# Patient Record
Sex: Female | Born: 1935 | Race: White | Hispanic: No | Smoking: Former smoker
Health system: Southern US, Community
[De-identification: ages and names within clinical notes are randomized; demographics above are authoritative.]

## PROBLEM LIST (undated history)

## (undated) DIAGNOSIS — E785 Hyperlipidemia, unspecified: Secondary | ICD-10-CM

## (undated) DIAGNOSIS — I10 Essential (primary) hypertension: Secondary | ICD-10-CM

## (undated) DIAGNOSIS — K219 Gastro-esophageal reflux disease without esophagitis: Secondary | ICD-10-CM

## (undated) DIAGNOSIS — M1990 Unspecified osteoarthritis, unspecified site: Secondary | ICD-10-CM

## (undated) DIAGNOSIS — F419 Anxiety disorder, unspecified: Secondary | ICD-10-CM

## (undated) DIAGNOSIS — M8580 Other specified disorders of bone density and structure, unspecified site: Secondary | ICD-10-CM

## (undated) HISTORY — PX: CHOLECYSTECTOMY: SHX55

## (undated) HISTORY — PX: ABDOMINAL HYSTERECTOMY: SHX81

## (undated) HISTORY — DX: Hyperlipidemia, unspecified: E78.5

## (undated) HISTORY — PX: JOINT REPLACEMENT: SHX530

## (undated) HISTORY — DX: Other specified disorders of bone density and structure, unspecified site: M85.80

## (undated) HISTORY — PX: TONSILLECTOMY: SUR1361

## (undated) HISTORY — DX: Anxiety disorder, unspecified: F41.9

---

## 2000-01-04 ENCOUNTER — Encounter: Admission: RE | Admit: 2000-01-04 | Discharge: 2000-01-04 | Payer: Self-pay | Admitting: Internal Medicine

## 2000-01-04 ENCOUNTER — Encounter: Payer: Self-pay | Admitting: Internal Medicine

## 2000-01-16 ENCOUNTER — Encounter: Payer: Self-pay | Admitting: Internal Medicine

## 2000-01-16 ENCOUNTER — Encounter: Admission: RE | Admit: 2000-01-16 | Discharge: 2000-01-16 | Payer: Self-pay | Admitting: Internal Medicine

## 2000-01-17 ENCOUNTER — Encounter: Payer: Self-pay | Admitting: Internal Medicine

## 2000-01-17 ENCOUNTER — Encounter: Admission: RE | Admit: 2000-01-17 | Discharge: 2000-01-17 | Payer: Self-pay | Admitting: Internal Medicine

## 2001-02-16 ENCOUNTER — Encounter: Payer: Self-pay | Admitting: Internal Medicine

## 2001-02-16 ENCOUNTER — Encounter: Admission: RE | Admit: 2001-02-16 | Discharge: 2001-02-16 | Payer: Self-pay | Admitting: Internal Medicine

## 2002-02-17 ENCOUNTER — Encounter: Admission: RE | Admit: 2002-02-17 | Discharge: 2002-02-17 | Payer: Self-pay | Admitting: Internal Medicine

## 2002-02-17 ENCOUNTER — Encounter: Payer: Self-pay | Admitting: Internal Medicine

## 2002-05-20 ENCOUNTER — Emergency Department (HOSPITAL_COMMUNITY): Admission: EM | Admit: 2002-05-20 | Discharge: 2002-05-20 | Payer: Self-pay | Admitting: Emergency Medicine

## 2002-05-22 ENCOUNTER — Emergency Department (HOSPITAL_COMMUNITY): Admission: EM | Admit: 2002-05-22 | Discharge: 2002-05-22 | Payer: Self-pay | Admitting: Emergency Medicine

## 2002-06-09 ENCOUNTER — Ambulatory Visit (HOSPITAL_COMMUNITY): Admission: RE | Admit: 2002-06-09 | Discharge: 2002-06-09 | Payer: Self-pay | Admitting: Internal Medicine

## 2002-07-02 ENCOUNTER — Inpatient Hospital Stay (HOSPITAL_COMMUNITY): Admission: AD | Admit: 2002-07-02 | Discharge: 2002-07-03 | Payer: Self-pay | Admitting: Family Medicine

## 2002-07-02 ENCOUNTER — Encounter: Payer: Self-pay | Admitting: Family Medicine

## 2003-02-21 ENCOUNTER — Encounter: Admission: RE | Admit: 2003-02-21 | Discharge: 2003-02-21 | Payer: Self-pay | Admitting: Internal Medicine

## 2003-02-21 ENCOUNTER — Encounter: Payer: Self-pay | Admitting: Internal Medicine

## 2004-01-25 ENCOUNTER — Ambulatory Visit (HOSPITAL_COMMUNITY): Admission: RE | Admit: 2004-01-25 | Discharge: 2004-01-25 | Payer: Self-pay | Admitting: Gastroenterology

## 2004-02-22 ENCOUNTER — Encounter: Admission: RE | Admit: 2004-02-22 | Discharge: 2004-02-22 | Payer: Self-pay | Admitting: Internal Medicine

## 2004-05-09 ENCOUNTER — Encounter: Admission: RE | Admit: 2004-05-09 | Discharge: 2004-05-09 | Payer: Self-pay | Admitting: Internal Medicine

## 2004-05-28 ENCOUNTER — Ambulatory Visit: Payer: Self-pay | Admitting: Internal Medicine

## 2004-07-06 ENCOUNTER — Ambulatory Visit: Payer: Self-pay | Admitting: Internal Medicine

## 2004-08-02 ENCOUNTER — Ambulatory Visit: Payer: Self-pay | Admitting: Internal Medicine

## 2004-09-25 ENCOUNTER — Ambulatory Visit: Payer: Self-pay | Admitting: Internal Medicine

## 2005-01-02 ENCOUNTER — Ambulatory Visit: Payer: Self-pay | Admitting: Family Medicine

## 2005-01-28 ENCOUNTER — Ambulatory Visit: Payer: Self-pay | Admitting: Internal Medicine

## 2005-03-14 ENCOUNTER — Ambulatory Visit: Payer: Self-pay | Admitting: Internal Medicine

## 2005-03-16 ENCOUNTER — Ambulatory Visit: Payer: Self-pay | Admitting: Family Medicine

## 2005-03-27 ENCOUNTER — Encounter: Admission: RE | Admit: 2005-03-27 | Discharge: 2005-03-27 | Payer: Self-pay | Admitting: Internal Medicine

## 2005-05-14 ENCOUNTER — Ambulatory Visit: Payer: Self-pay | Admitting: Internal Medicine

## 2005-07-31 ENCOUNTER — Ambulatory Visit: Payer: Self-pay | Admitting: Internal Medicine

## 2005-10-28 ENCOUNTER — Ambulatory Visit: Payer: Self-pay | Admitting: Internal Medicine

## 2005-11-18 ENCOUNTER — Ambulatory Visit: Payer: Self-pay | Admitting: Internal Medicine

## 2006-02-19 ENCOUNTER — Ambulatory Visit: Payer: Self-pay | Admitting: Internal Medicine

## 2006-04-03 ENCOUNTER — Encounter: Admission: RE | Admit: 2006-04-03 | Discharge: 2006-04-03 | Payer: Self-pay | Admitting: Internal Medicine

## 2006-05-14 ENCOUNTER — Ambulatory Visit: Payer: Self-pay | Admitting: Internal Medicine

## 2006-05-19 ENCOUNTER — Ambulatory Visit: Payer: Self-pay | Admitting: Internal Medicine

## 2006-07-31 ENCOUNTER — Ambulatory Visit: Payer: Self-pay | Admitting: Internal Medicine

## 2006-11-03 ENCOUNTER — Ambulatory Visit: Payer: Self-pay | Admitting: Internal Medicine

## 2006-11-28 ENCOUNTER — Ambulatory Visit: Payer: Self-pay | Admitting: Internal Medicine

## 2006-12-04 ENCOUNTER — Ambulatory Visit: Payer: Self-pay | Admitting: Internal Medicine

## 2007-01-06 ENCOUNTER — Encounter: Payer: Self-pay | Admitting: Internal Medicine

## 2007-01-06 ENCOUNTER — Ambulatory Visit: Payer: Self-pay | Admitting: Internal Medicine

## 2007-01-06 DIAGNOSIS — I10 Essential (primary) hypertension: Secondary | ICD-10-CM | POA: Insufficient documentation

## 2007-01-06 DIAGNOSIS — E785 Hyperlipidemia, unspecified: Secondary | ICD-10-CM

## 2007-01-06 DIAGNOSIS — M545 Low back pain: Secondary | ICD-10-CM

## 2007-02-10 ENCOUNTER — Ambulatory Visit: Payer: Self-pay | Admitting: Internal Medicine

## 2007-03-16 ENCOUNTER — Ambulatory Visit: Payer: Self-pay | Admitting: Internal Medicine

## 2007-03-16 DIAGNOSIS — R109 Unspecified abdominal pain: Secondary | ICD-10-CM | POA: Insufficient documentation

## 2007-03-16 LAB — CONVERTED CEMR LAB
ALT: 13 units/L (ref 0–35)
Amylase: 51 units/L (ref 27–131)
BUN: 11 mg/dL (ref 6–23)
Basophils Relative: 0.2 % (ref 0.0–1.0)
Bilirubin, Direct: 0.1 mg/dL (ref 0.0–0.3)
CO2: 30 meq/L (ref 19–32)
Chloride: 104 meq/L (ref 96–112)
Eosinophils Relative: 1.9 % (ref 0.0–5.0)
GFR calc Af Amer: 69 mL/min
HCT: 40.9 % (ref 36.0–46.0)
Hemoglobin: 13.8 g/dL (ref 12.0–15.0)
Lymphocytes Relative: 15.8 % (ref 12.0–46.0)
Monocytes Absolute: 0.7 10*3/uL (ref 0.2–0.7)
Neutro Abs: 5.4 10*3/uL (ref 1.4–7.7)
Platelets: 253 10*3/uL (ref 150–400)
RDW: 13.5 % (ref 11.5–14.6)
Total Bilirubin: 0.8 mg/dL (ref 0.3–1.2)
Total Protein: 7.1 g/dL (ref 6.0–8.3)
WBC: 7.4 10*3/uL (ref 4.5–10.5)

## 2007-03-17 ENCOUNTER — Encounter: Admission: RE | Admit: 2007-03-17 | Discharge: 2007-03-17 | Payer: Self-pay | Admitting: Internal Medicine

## 2007-03-19 ENCOUNTER — Telehealth (INDEPENDENT_AMBULATORY_CARE_PROVIDER_SITE_OTHER): Payer: Self-pay | Admitting: *Deleted

## 2007-04-23 ENCOUNTER — Encounter: Admission: RE | Admit: 2007-04-23 | Discharge: 2007-04-23 | Payer: Self-pay | Admitting: Internal Medicine

## 2007-05-13 ENCOUNTER — Ambulatory Visit: Payer: Self-pay | Admitting: Internal Medicine

## 2007-05-29 ENCOUNTER — Telehealth: Payer: Self-pay | Admitting: Internal Medicine

## 2007-09-14 ENCOUNTER — Ambulatory Visit: Payer: Self-pay | Admitting: Internal Medicine

## 2007-10-19 ENCOUNTER — Telehealth (INDEPENDENT_AMBULATORY_CARE_PROVIDER_SITE_OTHER): Payer: Self-pay | Admitting: *Deleted

## 2007-10-19 ENCOUNTER — Telehealth: Payer: Self-pay | Admitting: Internal Medicine

## 2007-10-19 ENCOUNTER — Encounter: Payer: Self-pay | Admitting: Internal Medicine

## 2007-10-20 ENCOUNTER — Ambulatory Visit: Payer: Self-pay | Admitting: Internal Medicine

## 2007-10-20 DIAGNOSIS — R0609 Other forms of dyspnea: Secondary | ICD-10-CM | POA: Insufficient documentation

## 2007-10-20 DIAGNOSIS — R0989 Other specified symptoms and signs involving the circulatory and respiratory systems: Secondary | ICD-10-CM

## 2008-02-08 ENCOUNTER — Ambulatory Visit: Payer: Self-pay | Admitting: Internal Medicine

## 2008-02-17 ENCOUNTER — Telehealth: Payer: Self-pay | Admitting: Family Medicine

## 2008-02-18 ENCOUNTER — Ambulatory Visit: Payer: Self-pay | Admitting: Family Medicine

## 2008-02-23 ENCOUNTER — Ambulatory Visit: Payer: Self-pay | Admitting: Internal Medicine

## 2008-02-23 DIAGNOSIS — M546 Pain in thoracic spine: Secondary | ICD-10-CM | POA: Insufficient documentation

## 2008-03-30 ENCOUNTER — Ambulatory Visit: Payer: Self-pay | Admitting: Internal Medicine

## 2008-03-30 DIAGNOSIS — M79609 Pain in unspecified limb: Secondary | ICD-10-CM

## 2008-03-31 ENCOUNTER — Emergency Department (HOSPITAL_COMMUNITY): Admission: EM | Admit: 2008-03-31 | Discharge: 2008-03-31 | Payer: Self-pay | Admitting: Emergency Medicine

## 2008-04-01 ENCOUNTER — Telehealth: Payer: Self-pay | Admitting: Family Medicine

## 2008-04-27 ENCOUNTER — Telehealth: Payer: Self-pay | Admitting: Internal Medicine

## 2008-04-28 ENCOUNTER — Encounter: Payer: Self-pay | Admitting: Internal Medicine

## 2008-04-28 ENCOUNTER — Encounter: Admission: RE | Admit: 2008-04-28 | Discharge: 2008-04-28 | Payer: Self-pay | Admitting: Internal Medicine

## 2008-06-09 ENCOUNTER — Ambulatory Visit: Payer: Self-pay | Admitting: Internal Medicine

## 2009-01-13 ENCOUNTER — Encounter: Payer: Self-pay | Admitting: Internal Medicine

## 2009-01-16 ENCOUNTER — Ambulatory Visit: Payer: Self-pay | Admitting: Internal Medicine

## 2009-01-16 DIAGNOSIS — M1990 Unspecified osteoarthritis, unspecified site: Secondary | ICD-10-CM

## 2009-01-16 LAB — CONVERTED CEMR LAB
AST: 25 units/L (ref 0–37)
Alkaline Phosphatase: 121 units/L — ABNORMAL HIGH (ref 39–117)
Basophils Absolute: 0 10*3/uL (ref 0.0–0.1)
Calcium: 9.2 mg/dL (ref 8.4–10.5)
Cholesterol: 147 mg/dL (ref 0–200)
Eosinophils Absolute: 0 10*3/uL (ref 0.0–0.7)
HCT: 42.2 % (ref 36.0–46.0)
HDL: 68.3 mg/dL (ref >39.00)
MCHC: 33.1 g/dL (ref 30.0–36.0)
Monocytes Relative: 10.4 % (ref 3.0–12.0)
Neutro Abs: 3.4 10*3/uL (ref 1.4–7.7)
Platelets: 197 10*3/uL (ref 150.0–400.0)
Potassium: 4.3 meq/L (ref 3.5–5.1)
RDW: 14 % (ref 11.5–14.6)
Sodium: 146 meq/L — ABNORMAL HIGH (ref 135–145)
Total Protein: 6.9 g/dL (ref 6.0–8.3)
Triglycerides: 119 mg/dL (ref 0.0–149.0)

## 2009-04-05 ENCOUNTER — Emergency Department (HOSPITAL_COMMUNITY): Admission: EM | Admit: 2009-04-05 | Discharge: 2009-04-05 | Payer: Self-pay | Admitting: Emergency Medicine

## 2009-04-07 ENCOUNTER — Encounter: Admission: RE | Admit: 2009-04-07 | Discharge: 2009-04-07 | Payer: Self-pay | Admitting: Specialist

## 2009-05-09 ENCOUNTER — Ambulatory Visit: Payer: Self-pay | Admitting: Internal Medicine

## 2009-05-21 IMAGING — CR DG ABDOMEN ACUTE W/ 1V CHEST
3 series · 3 of 3 positions shown · non-contrast
Comparison: None.

CLINICAL DATA: Abdominal pain.
 ABDOMINAL SERIES, INCLUDING SINGLE VIEW CHEST:

[w chest pa]
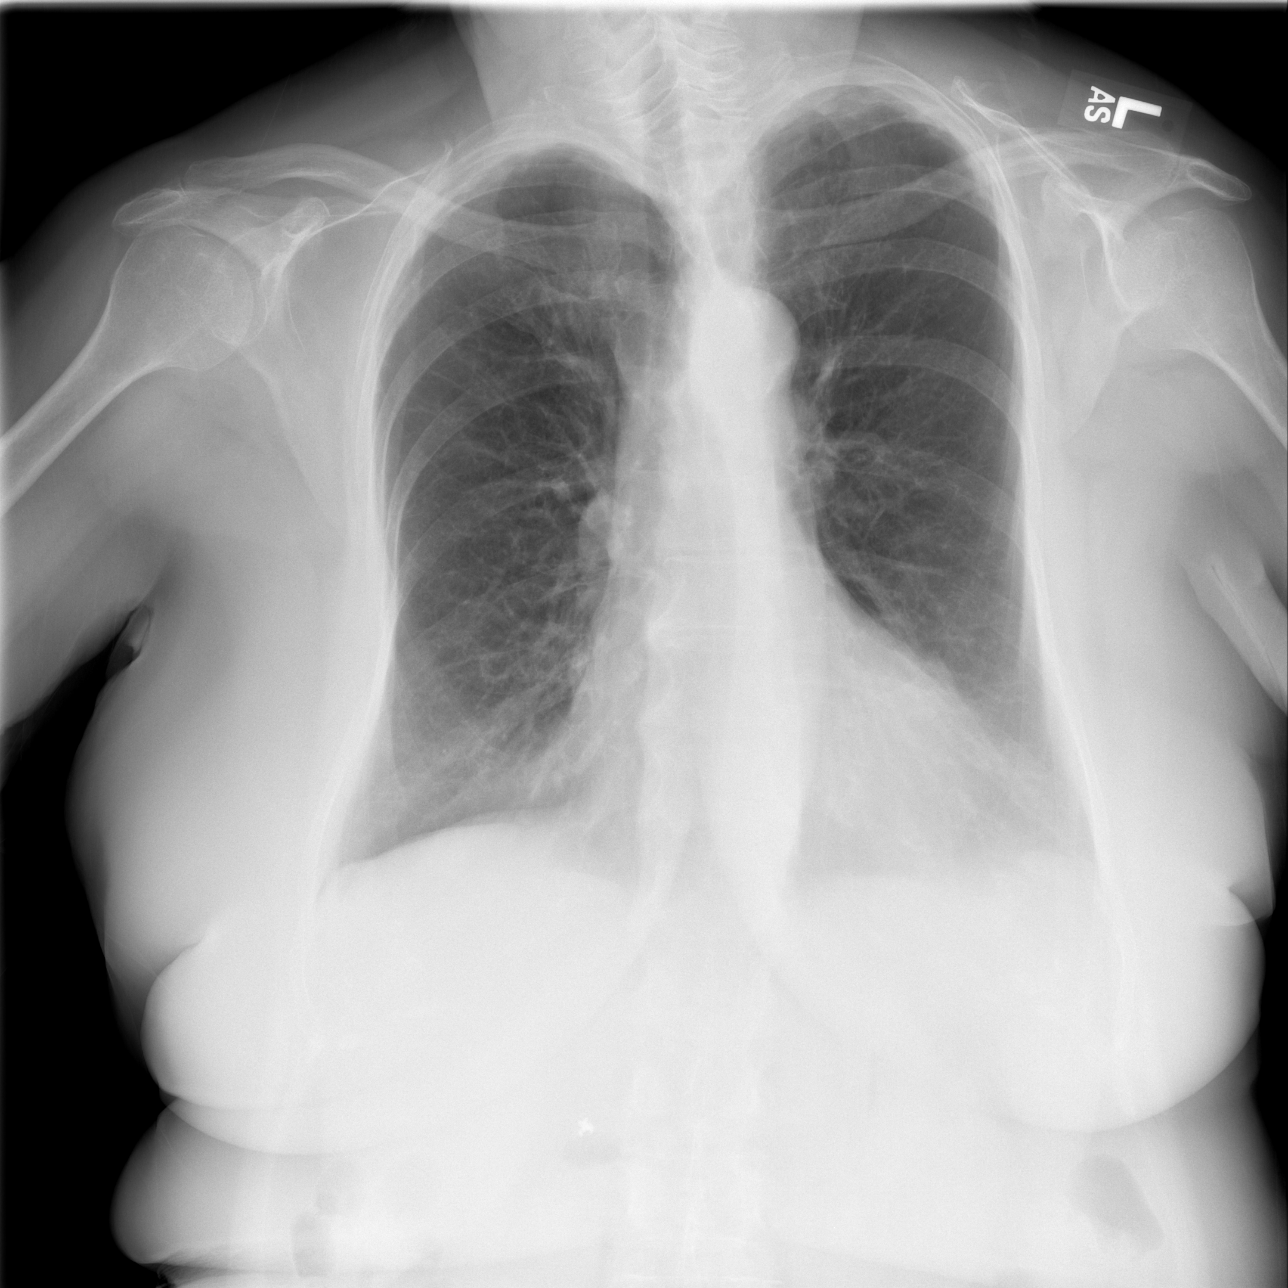

[w abdomen upright]
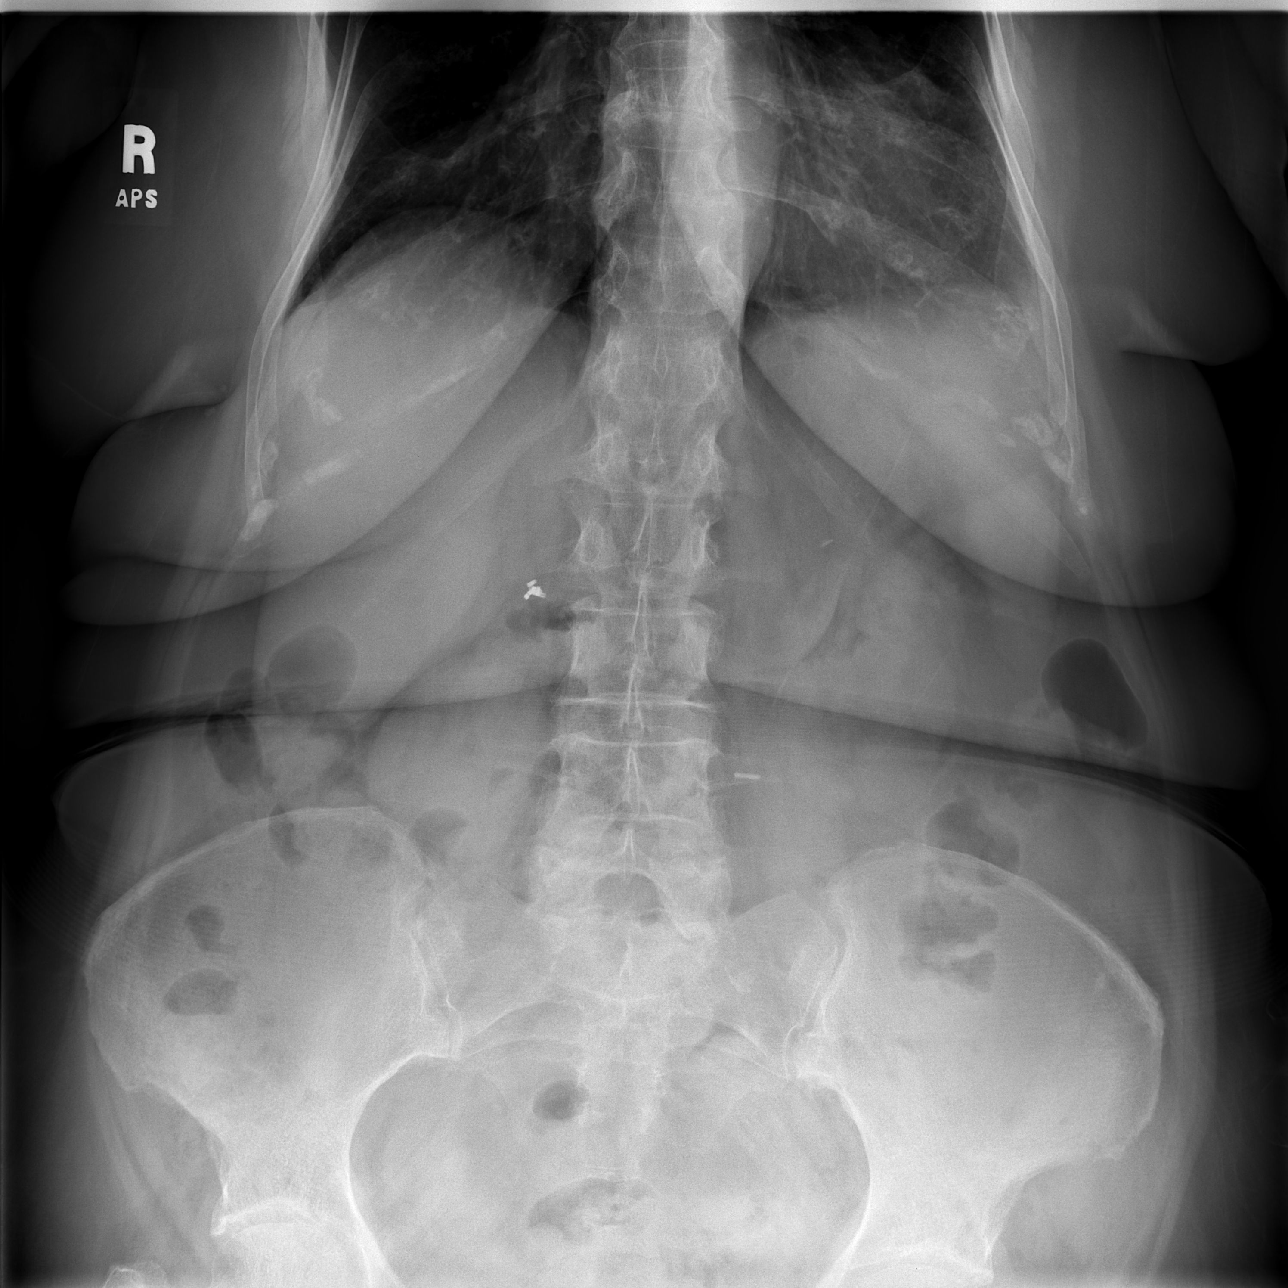

[t abdomen supine]
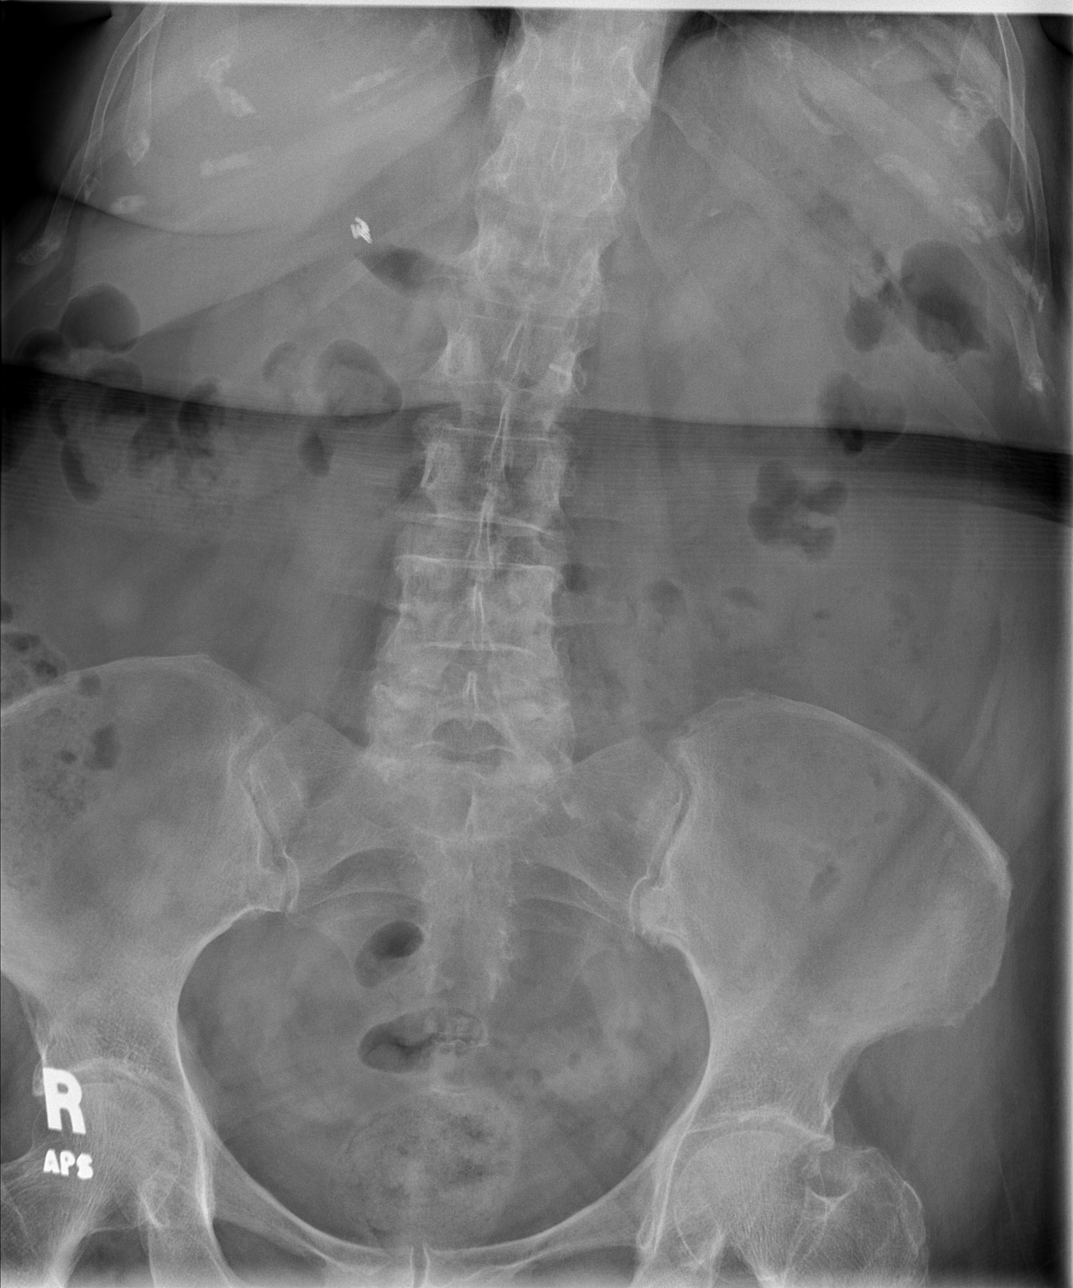

[3 of 3 positions shown; findings below may reference images not displayed]

Cardiomegaly.  Mild biapical pleural thickening without bony destruction.  No infiltrate or congestive heart failure.   Aorta is calcified and minimally tortuous.  
 No plain film evidence of bowel obstruction or free intraperitoneal air.  Status post cholecystectomy.
IMPRESSION: 1.  No evidence of bowel obstruction or free intraperitoneal air.
 2.  Mild cardiomegaly.  The prominence of the heart silhouette may be partially explained by epicardial fat pads.

## 2009-09-08 ENCOUNTER — Ambulatory Visit: Payer: Self-pay | Admitting: Internal Medicine

## 2009-09-18 ENCOUNTER — Encounter: Admission: RE | Admit: 2009-09-18 | Discharge: 2009-09-18 | Payer: Self-pay | Admitting: Internal Medicine

## 2010-03-05 ENCOUNTER — Ambulatory Visit: Payer: Self-pay | Admitting: Internal Medicine

## 2010-03-29 ENCOUNTER — Ambulatory Visit: Payer: Self-pay | Admitting: Internal Medicine

## 2010-03-29 DIAGNOSIS — B009 Herpesviral infection, unspecified: Secondary | ICD-10-CM | POA: Insufficient documentation

## 2010-05-15 ENCOUNTER — Ambulatory Visit: Payer: Self-pay | Admitting: Internal Medicine

## 2010-07-10 ENCOUNTER — Telehealth: Payer: Self-pay | Admitting: Internal Medicine

## 2010-07-10 ENCOUNTER — Ambulatory Visit
Admission: RE | Admit: 2010-07-10 | Discharge: 2010-07-10 | Payer: Self-pay | Source: Home / Self Care | Attending: Internal Medicine | Admitting: Internal Medicine

## 2010-07-10 DIAGNOSIS — H9209 Otalgia, unspecified ear: Secondary | ICD-10-CM | POA: Insufficient documentation

## 2010-08-05 LAB — CONVERTED CEMR LAB
ALT: 22 units/L (ref 0–40)
AST: 24 units/L (ref 0–37)
Albumin: 3.5 g/dL (ref 3.5–5.2)
Alkaline Phosphatase: 91 units/L (ref 39–117)
Alkaline Phosphatase: 94 units/L (ref 39–117)
BUN: 20 mg/dL (ref 6–23)
BUN: 21 mg/dL (ref 6–23)
CO2: 32 meq/L (ref 19–32)
CO2: 35 meq/L — ABNORMAL HIGH (ref 19–32)
Chloride: 106 meq/L (ref 96–112)
Cholesterol: 146 mg/dL (ref 0–200)
Creatinine, Ser: 1.1 mg/dL (ref 0.4–1.2)
Creatinine, Ser: 1.2 mg/dL (ref 0.4–1.2)
Eosinophil percent: 4.2 % (ref 0.0–5.0)
Eosinophils Absolute: 0.1 10*3/uL (ref 0.0–0.7)
Glucose, Bld: 85 mg/dL (ref 70–99)
HDL: 68.6 mg/dL (ref >39.00)
Hemoglobin: 14 g/dL (ref 12.0–15.0)
Hemoglobin: 15.4 g/dL — ABNORMAL HIGH (ref 12.0–15.0)
Lymphocytes Relative: 25.2 % (ref 12.0–46.0)
MCV: 97.9 fL (ref 78.0–100.0)
Monocytes Absolute: 0.5 10*3/uL (ref 0.1–1.0)
Monocytes Absolute: 0.5 10*3/uL (ref 0.2–0.7)
Monocytes Relative: 10.4 % (ref 3.0–12.0)
Neutro Abs: 3.4 10*3/uL (ref 1.4–7.7)
Potassium: 5.3 meq/L — ABNORMAL HIGH (ref 3.5–5.1)
RDW: 13.5 % (ref 11.5–14.6)
Sodium: 143 meq/L (ref 135–145)
Sodium: 145 meq/L (ref 135–145)
TSH: 2.82 microintl units/mL (ref 0.35–5.50)
Total Bilirubin: 0.9 mg/dL (ref 0.3–1.2)
Total CHOL/HDL Ratio: 2
Total Protein: 6.7 g/dL (ref 6.0–8.3)
Triglyceride fasting, serum: 110 mg/dL (ref 0–149)
VLDL: 28.2 mg/dL (ref 0.0–40.0)
WBC: 4.5 10*3/uL (ref 4.5–10.5)

## 2010-08-09 NOTE — Assessment & Plan Note (Signed)
Summary: pt will come in fasting/njr//pt rescd from bump//ccm   Vital Signs:  Patient profile:   77 year old female Height:      61.5 inches Weight:      196 pounds BMI:     36.57 Temp:     98.2 degrees F oral BP sitting:   130 / 80  (right arm) Cuff size:   large  Vitals Entered By: Cay Schillings LPN (August 29, 624THL 8:37 AM) CC: cpx - doing ok Is Patient Diabetic? No   CC:  cpx - doing ok.  History of Present Illness: 77 year old patient who is seen today for a comprehensive evaluation.  She fractured her left humerus in October of last year and had surgery at [facility]  She states that she had a 2-D echocardiogram at that time.  She has long a long history of dyspnea on exertion.  She has treated hypertension, dyslipidemia, and significant osteoarthritis.  She has a history of exogenous obesity.  Here for Medicare AWV:  1.   Risk factors based on Past M, S, F history: risk factors include hypertension, and dyslipidemia. 2.   Physical Activities: very sedentary due to obesity, arthritis, and dyspnea on exertion 3.   Depression/mood: history depression, or mood disorder 4.   Hearing: no deficits 5.   ADL's: independent in all aspects of daily living, but sedentary 6.   Fall Risk: moderate due to age, obesity, arthritis 7.   Home Safety: no problems identified 8.   Height, weight, &visual acuity:  there continues to be modest weight gain.  No difficulty with visual acuity 9.   Counseling: weight loss encouraged 10.   Labs ordered based on risk factors: laboratory profile, including lipid panel will be reviewed 11.           Referral Coordination- a chest x-ray will be obtained 12.           Care Plan-  weight loss, heart healthy diet or exercise.  All encouraged 13.            Cognitive Assessment-  alert and oriented, with a normal affect.  She feels that she may have become slightly more forgetful.  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  No family  concerns with memory   Preventive Screening-Counseling & Management  Alcohol-Tobacco     Smoking Status: quit  Allergies: 1)  ! Penicillin G Pot in Dextrose (Penicillin G Potassium in D5w) 2)  ! Feldene  Past History:  Past Medical History: Hyperlipidemia Hypertension DJD Low back pain Knee pain exogenous obesity Osteoarthritis mild hypoxemia  Past Surgical History: Appendectomy Cholecystectomy Hysterectomy Tonsillectomy cataract surgery OD 2 D Echo 10-11  ([facility])  colonoscopy none- prior sigmoidoscopy 2003 surgery for fracture, left humerus September 2010  Family History: Reviewed history from 01/16/2009 and no changes required. Father  ded age 81: cerebrovascular disease CAD, prostate cancer mother died at age 36, senile dementia one sister died of a lymphoma  Social History: Reviewed history from 01/16/2009 and no changes required. 3 children  Widow[place]-October 2006  Review of Systems       The patient complains of weight gain, dyspnea on exertion, and difficulty walking.  The patient denies anorexia, fever, weight loss, vision loss, decreased hearing, hoarseness, chest pain, syncope, peripheral edema, prolonged cough, headaches, hemoptysis, abdominal pain, melena, hematochezia, severe indigestion/heartburn, hematuria, incontinence, genital sores, muscle weakness, suspicious skin lesions, transient blindness, depression, unusual weight change, abnormal bleeding, enlarged lymph nodes, angioedema, and breast masses.  Physical Exam  General:  overweight-appearing.  130/80overweight-appearing.   Head:  Normocephalic and atraumatic without obvious abnormalities. No apparent alopecia or balding. Eyes:  No corneal or conjunctival inflammation noted. EOMI. Perrla. Funduscopic exam benign, without hemorrhages, exudates or papilledema. Vision grossly normal. Ears:  External ear exam shows no significant lesions or deformities.  Otoscopic examination reveals clear  canals, tympanic membranes are intact bilaterally without bulging, retraction, inflammation or discharge. Hearing is grossly normal bilaterally. Nose:  External nasal examination shows no deformity or inflammation. Nasal mucosa are pink and moist without lesions or exudates. Mouth:  Oral mucosa and oropharynx without lesions or exudates.  dentures in place Neck:  No deformities, masses, or tenderness noted. Chest Wall:  No deformities, masses, or tenderness noted. Breasts:  No mass, nodules, thickening, tenderness, bulging, retraction, inflamation, nipple discharge or skin changes noted.   Lungs:  Normal respiratory effort, chest expands symmetrically. Lungs are clear to auscultation, no crackles or wheezes. O2 saturation 93% Heart:  Normal rate and regular rhythm. S1 and S2 normal without gallop, murmur, click, rub or other extra sounds. Abdomen:  Bowel sounds positive,abdomen soft and non-tender without masses, organomegaly or hernias noted. Rectal:  No external abnormalities noted. Normal sphincter tone. No rectal masses or tenderness. Genitalia:  declines Msk:  No deformity or scoliosis noted of thoracic or lumbar spine.   Pulses:  the right dorsalis pedis pulse absent Extremities:  No clubbing, cyanosis, edema, or deformity noted with normal full range of motion of all joints.   Neurologic:  No cranial nerve deficits noted. Station and gait are normal. Plantar reflexes are down-going bilaterally. DTRs are symmetrical throughout. Sensory, motor and coordinative functions appear intact. Skin:  Intact without suspicious lesions or rashes Cervical Nodes:  No lymphadenopathy noted Axillary Nodes:  No palpable lymphadenopathy Inguinal Nodes:  No significant adenopathy Psych:  Cognition and judgment appear intact. Alert and cooperative with normal attention span and concentration. No apparent delusions, illusions, hallucinations   Impression & Recommendations:  Problem # 1:  [place] (ICD-V70.0)  Orders: Medicare -1st Annual Wellness Visit [phone])  Complete Medication List: 1)  Hydrochlorothiazide 25 Mg Tabs (Hydrochlorothiazide) .Marland Kitchen.. 1 once daily 2)  Tramadol Hcl 50 Mg Tabs (Tramadol hcl) .Marland Kitchen.. 1 q6h as needed 3)  Meclizine Hcl 25 Mg Tabs (Meclizine hcl) .... As needed vertigo 4)  Trazadone 50 Mg  .... As needed for sleep 5)  Vicodin Es 7.5-750 Mg Tabs (Hydrocodone-acetaminophen) .... 1/2 three times a day as needed 6)  Aspir-low 81 Mg Tbec (Aspirin) .Marland Kitchen.. 1 once daily 7)  Omeprazole 20 Mg Cpdr (Omeprazole) .Marland Kitchen.. 1 two times a day 8)  Crestor 10 Mg Tabs (Rosuvastatin calcium) .... One daily 9)  Os-cal 500 + D 500-200 Mg-unit Tabs (Calcium carbonate-vitamin d) .... One twice daily  Other Orders: EKG w/ Interpretation (93000) Venipuncture HR:[id number]) TLB-Lipid Panel (80061-LIPID) TLB-BMP (Basic Metabolic Panel-BMET) (99991111) TLB-CBC Platelet - w/Differential (85025-CBCD) TLB-Hepatic/Liver Function Pnl (80076-HEPATIC) TLB-TSH (Thyroid Stimulating Hormone) (84443-TSH) T-2 View CXR (71020TC) Specimen Handling (99000)  Patient Instructions: 1)  Please schedule a follow-up appointment in 4 months. 2)  Limit your Sodium (Salt) to less than 2 grams a day(slightly less than 1/2 a teaspoon) to prevent fluid retention, swelling, or worsening of symptoms. 3)  It is important that you exercise regularly at least 20 minutes 5 times a week. If you develop chest pain, have severe difficulty breathing, or feel very tired , stop exercising immediately and seek medical attention. 4)  You need to lose  weight. Consider a lower calorie diet and regular exercise.  5)  Take calcium +Vitamin D daily. Prescriptions: CRESTOR 10 MG TABS (ROSUVASTATIN CALCIUM) one daily  #90 x 5   Entered and Authorized by:   Marletta Lor  MD   Signed by:   Marletta Lor  MD on 03/05/2010   Method used:   Print then Give to Patient   RxID:   [id number] OMEPRAZOLE 20 MG CPDR  (OMEPRAZOLE) 1 two times a day  #180 x 6   Entered and Authorized by:   Marletta Lor  MD   Signed by:   Marletta Lor  MD on 03/05/2010   Method used:   Print then Give to Patient   RxID:   [id number] VICODIN ES 7.5-750 MG  TABS (HYDROCODONE-ACETAMINOPHEN) 1/2 three times a day as needed  #90 x 4   Entered and Authorized by:   Marletta Lor  MD   Signed by:   Marletta Lor  MD on 03/05/2010   Method used:   Print then Give to Patient   RxID:   [id number] TRAMADOL HCL 50 MG  TABS (TRAMADOL HCL) 1 q6h as needed  #90 x 6   Entered and Authorized by:   Marletta Lor  MD   Signed by:   Marletta Lor  MD on 03/05/2010   Method used:   Print then Give to Patient   RxID:   [id number] HYDROCHLOROTHIAZIDE 25 MG  TABS (HYDROCHLOROTHIAZIDE) 1 once daily  #90 x 6   Entered and Authorized by:   Marletta Lor  MD   Signed by:   Marletta Lor  MD on 03/05/2010   Method used:   Print then Give to Patient   RxID:   [id number]

## 2010-08-09 NOTE — Assessment & Plan Note (Signed)
Summary: ear ache/per Dr. k/dm   Vital Signs:  Patient profile:   77 year old female Weight:      198 pounds Temp:     98.0 degrees F oral BP sitting:   120 / 80  (right arm) Cuff size:   regular  Vitals Entered By: Cay Schillings LPN (January  3, [id number] 4:44 PM) CC: c/o (L) ear pain Is Patient Diabetic? No   CC:  c/o (L) ear pain.  History of Present Illness: 77 year old patient who presents with a one-day history of left ear pain.  There's been no URI symptoms, drainage from the ear, fever.  Denies any sore throat.  She does have treated hypertension, which has been stable.  She is on Crestor for dyslipidemia.  Allergies: 1)  ! Penicillin G Pot in Dextrose (Penicillin G Potassium in D5w) 2)  ! Feldene  Past History:  Past Medical History: Reviewed history from 03/05/2010 and no changes required. Hyperlipidemia Hypertension DJD Low back pain Knee pain exogenous obesity Osteoarthritis mild hypoxemia  Review of Systems  The patient denies anorexia, fever, weight loss, weight gain, vision loss, decreased hearing, hoarseness, chest pain, syncope, dyspnea on exertion, peripheral edema, prolonged cough, headaches, hemoptysis, abdominal pain, melena, hematochezia, severe indigestion/heartburn, hematuria, incontinence, genital sores, muscle weakness, suspicious skin lesions, transient blindness, difficulty walking, depression, unusual weight change, abnormal bleeding, enlarged lymph nodes, angioedema, and breast masses.    Physical Exam  General:  overweight-appearing.  no acute distress.  Blood pressure 120/80 Head:  Normocephalic and atraumatic without obvious abnormalities. No apparent alopecia or balding. Eyes:  No corneal or conjunctival inflammation noted. EOMI. Perrla. Funduscopic exam benign, without hemorrhages, exudates or papilledema. Vision grossly normal. Ears:  the left the tympanic membrane and canal are mildly erythematous Nose:  External nasal examination  shows no deformity or inflammation. Nasal mucosa are pink and moist without lesions or exudates. Mouth:  Oral mucosa and oropharynx without lesions or exudates.  Teeth in good repair. Neck:  No deformities, masses, or tenderness noted. Lungs:  Normal respiratory effort, chest expands symmetrically. Lungs are clear to auscultation, no crackles or wheezes.   Impression & Recommendations:  Problem # 1:  EAR PAIN, LEFT (ICD-388.70)  Her updated medication list for this problem includes:    Neomycin-polymyxin-hc 3.5-10000-1 Soln (Neomycin-polymyxin-hc) .Marland Kitchen... 2 drops as qid may have a very early otitis; will treat empirically with otic drops  Problem # 2:  HYPERTENSION (ICD-401.9)  Her updated medication list for this problem includes:    Hydrochlorothiazide 25 Mg Tabs (Hydrochlorothiazide) .Marland Kitchen... 1 once daily  Complete Medication List: 1)  Hydrochlorothiazide 25 Mg Tabs (Hydrochlorothiazide) .Marland Kitchen.. 1 once daily 2)  Tramadol Hcl 50 Mg Tabs (Tramadol hcl) .Marland Kitchen.. 1 q6h as needed 3)  Meclizine Hcl 25 Mg Tabs (Meclizine hcl) .... As needed vertigo 4)  Trazadone 50 Mg  .... As needed for sleep 5)  Vicodin Es 7.5-750 Mg Tabs (Hydrocodone-acetaminophen) .... 1/2 three times a day as needed 6)  Aspir-low 81 Mg Tbec (Aspirin) .Marland Kitchen.. 1 once daily 7)  Omeprazole 20 Mg Cpdr (Omeprazole) .Marland Kitchen.. 1 two times a day 8)  Crestor 10 Mg Tabs (Rosuvastatin calcium) .... One daily 9)  Os-cal 500 + D 500-200 Mg-unit Tabs (Calcium carbonate-vitamin d) .... One twice daily 10)  Fish Oil 1000 Mg Caps (Omega-3 fatty acids) .... Qd 11)  Multivitamins Caps (Multiple vitamin) .... Qd 12)  Valacyclovir Hcl 500 Mg Tabs (Valacyclovir hcl) .... One twice daily 13)  Mupirocin 2 % Oint (  Mupirocin) .... Use twice daily as needed 14)  Oxycodone  .... As needed - given t her from [place] 15)  Neomycin-polymyxin-hc 3.5-10000-1 Soln (Neomycin-polymyxin-hc) .... 2 drops as qid  Patient Instructions: 1)  Otic drops as discussed;  2)  ROV  as scheduled  Prescriptions: NEOMYCIN-POLYMYXIN-HC 3.5-10000-1 SOLN (NEOMYCIN-POLYMYXIN-HC) 2 drops AS QID  #10 cc x 1   Entered and Authorized by:   Marletta Lor  MD   Signed by:   Marletta Lor  MD on 07/10/2010   Method used:   Electronically to        [facility] (retail)       [address]       [place], [place]  [address]       Ph: [phone]       Fax: [phone]   RxID:   [id number]    Orders Added: 1)  Est. Patient Level III [id number]

## 2010-08-09 NOTE — Assessment & Plan Note (Signed)
Summary: bottom lip sore/pt having trouble eating/feverish/flu shot/cjr   Vital Signs:  Patient profile:   77 year old female Weight:      199 pounds Temp:     98.1 degrees F oral BP sitting:   140 / 80  (right arm) Cuff size:   regular  Vitals Entered By: Cay Schillings LPN (September 22, 624THL 10:07 AM) CC: c/o (R) lower lip pain x1 wk  Is Patient Diabetic? No Flu Vaccine Consent Questions     Do you have a history of severe allergic reactions to this vaccine? no    Any prior history of allergic reactions to egg and/or gelatin? no    Do you have a sensitivity to the preservative Thimersol? no    Do you have a past history of Guillan-Barre Syndrome? no    Do you currently have an acute febrile illness? no    Have you ever had a severe reaction to latex? no    Vaccine information given and explained to patient? yes    Are you currently pregnant? no    Lot Number:[id number]   Exp Date:01/05/2011   Site Given  Left Deltoid IM   CC:  c/o (R) lower lip pain x1 wk .  History of Present Illness: 78 year old patient who presents today with a complaint of a tingling sensation involving her right lower lip.  This is aggravated by eating and chewing.  She states that she often gets a fever blister at the site, usually once per year.  There is been no recent ulceration.  She has treated hypertension, dyslipidemia, and osteoarthritis.  She is on no new medications.  Allergies: 1)  ! Penicillin G Pot in Dextrose (Penicillin G Potassium in D5w) 2)  ! Feldene  Past History:  Past Medical History: Reviewed history from 03/05/2010 and no changes required. Hyperlipidemia Hypertension DJD Low back pain Knee pain exogenous obesity Osteoarthritis mild hypoxemia  Review of Systems  The patient denies anorexia, fever, weight loss, weight gain, vision loss, decreased hearing, hoarseness, chest pain, syncope, dyspnea on exertion, peripheral edema, prolonged cough, headaches, hemoptysis,  abdominal pain, melena, hematochezia, severe indigestion/heartburn, hematuria, incontinence, genital sores, muscle weakness, suspicious skin lesions, transient blindness, difficulty walking, depression, unusual weight change, abnormal bleeding, enlarged lymph nodes, angioedema, and breast masses.    Physical Exam  General:  overweight-appearing.  elderly no distress.  Blood pressure 130/80 Head:  Normocephalic and atraumatic without obvious abnormalities. No apparent alopecia or balding. Eyes:  No corneal or conjunctival inflammation noted. EOMI. Perrla. Funduscopic exam benign, without hemorrhages, exudates or papilledema. Vision grossly normal. Ears:  External ear exam shows no significant lesions or deformities.  Otoscopic examination reveals clear canals, tympanic membranes are intact bilaterally without bulging, retraction, inflammation or discharge. Hearing is grossly normal bilaterally. Nose:  External nasal examination shows no deformity or inflammation. Nasal mucosa are pink and moist without lesions or exudates. Mouth:  Oral mucosa and oropharynx without lesions or exudates.  no lesions were noted.   Neck:  No deformities, masses, or tenderness noted. Lungs:  Normal respiratory effort, chest expands symmetrically. Lungs are clear to auscultation, no crackles or wheezes.   Impression & Recommendations:  Problem # 1:  HSV (ICD-054.9) will treat what valtrex for 5 days and observe  Problem # 2:  OSTEOARTHRITIS (ICD-715.90)  Her updated medication list for this problem includes:    Tramadol Hcl 50 Mg Tabs (Tramadol hcl) .Marland Kitchen... 1 q6h as needed    Vicodin Es 7.5-750 Mg Tabs (Hydrocodone-acetaminophen) .Marland KitchenMarland KitchenMarland KitchenMarland Kitchen  1/2 three times a day as needed    Aspir-low 81 Mg Tbec (Aspirin) .Marland Kitchen... 1 once daily  Problem # 3:  HYPERTENSION (ICD-401.9)  Her updated medication list for this problem includes:    Hydrochlorothiazide 25 Mg Tabs (Hydrochlorothiazide) .Marland Kitchen... 1 once daily  Complete Medication  List: 1)  Hydrochlorothiazide 25 Mg Tabs (Hydrochlorothiazide) .Marland Kitchen.. 1 once daily 2)  Tramadol Hcl 50 Mg Tabs (Tramadol hcl) .Marland Kitchen.. 1 q6h as needed 3)  Meclizine Hcl 25 Mg Tabs (Meclizine hcl) .... As needed vertigo 4)  Trazadone 50 Mg  .... As needed for sleep 5)  Vicodin Es 7.5-750 Mg Tabs (Hydrocodone-acetaminophen) .... 1/2 three times a day as needed 6)  Aspir-low 81 Mg Tbec (Aspirin) .Marland Kitchen.. 1 once daily 7)  Omeprazole 20 Mg Cpdr (Omeprazole) .Marland Kitchen.. 1 two times a day 8)  Crestor 10 Mg Tabs (Rosuvastatin calcium) .... One daily 9)  Os-cal 500 + D 500-200 Mg-unit Tabs (Calcium carbonate-vitamin d) .... One twice daily 10)  Fish Oil 1000 Mg Caps (Omega-3 fatty acids) .... Qd 11)  Multivitamins Caps (Multiple vitamin) .... Qd 12)  Valacyclovir Hcl 500 Mg Tabs (Valacyclovir hcl) .... One twice daily 13)  Mupirocin 2 % Oint (Mupirocin) .... Use twice daily as needed  Other Orders: Flu Vaccine 39yrs + MEDICARE PATIENTS [id number]) Administration Flu vaccine - MCR [id number])  Patient Instructions: 1)  Please schedule a follow-up appointment in 3 months. 2)  Limit your Sodium (Salt) to less than 2 grams a day(slightly less than 1/2 a teaspoon) to prevent fluid retention, swelling, or worsening of symptoms. 3)  It is important that you exercise regularly at least 20 minutes 5 times a week. If you develop chest pain, have severe difficulty breathing, or feel very tired , stop exercising immediately and seek medical attention. Prescriptions: MUPIROCIN 2 % OINT (MUPIROCIN) use twice daily as needed  #30 gm x 2   Entered and Authorized by:   Marletta Lor  MD   Signed by:   Marletta Lor  MD on 03/29/2010   Method used:   Print then Give to Patient   RxID:   [id number] VALACYCLOVIR HCL 500 MG TABS (VALACYCLOVIR HCL) one twice daily  #20 x 2   Entered and Authorized by:   Marletta Lor  MD   Signed by:   Marletta Lor  MD on 03/29/2010   Method used:   Print then Give to  Patient   RxID:   [id number]

## 2010-08-09 NOTE — Progress Notes (Signed)
Summary: wants appt today  Phone Note Call from Patient Call back at Home Phone [phone]   Caller: Patient---live call Reason for Call: Acute Illness Summary of Call: complains of earache. would like to be seen today. Initial call taken by: Despina Arias,  July 10, 2010 9:39 AM  Follow-up for Phone Call        Left ear pain since last night......Marland Kitchenhurts to touch and throbs. Kristopher Oppenheim ([place]) Needs RX. No URI or sinus complaint, fever, or other symptoms. Follow-up by: Deanna Artis CMA AAMA,  July 10, 2010 1:11 PM  Additional Follow-up for Phone Call Additional follow up Details #1::        ROV today-last appt of day Additional Follow-up by: Marletta Lor  MD,  July 10, 2010 2:12 PM    Additional Follow-up for Phone Call Additional follow up Details #2::    done Follow-up by: [facility] CMA AAMA,  July 10, 2010 2:26 PM

## 2010-08-09 NOTE — Assessment & Plan Note (Signed)
Summary: BACK/HIP PAIN/OK PER DOC/NJR   Vital Signs:  Patient profile:   77 year old female Weight:      195 pounds Temp:     98.3 degrees F oral BP sitting:   108 / 74  (right arm) Cuff size:   regular  Vitals Entered By: Cay Schillings LPN (November  8, 624THL 12:58 PM) CC: c/o (R) hip and low back pain , especially in AM  Is Patient Diabetic? No   CC:  c/o (R) hip and low back pain  and especially in AM .  History of Present Illness: 77 year old patient who has a history of osteoarthritis and low back pain.  For the past several mise she has had low back pain that has awakened her at night.  She seems to be improving and sleeping longer.  She is only taking Tylenol.  She does have a prescription for hydrocodone, tramadol and also oxycodone from a prior orthopedic procedure.  She has treated hypertension, which has been stable  Preventive Screening-Counseling & Management  Alcohol-Tobacco     Smoking Status: never  Allergies: 1)  ! Penicillin G Pot in Dextrose (Penicillin G Potassium in D5w) 2)  ! Feldene  Past History:  Past Medical History: Reviewed history from 03/05/2010 and no changes required. Hyperlipidemia Hypertension DJD Low back pain Knee pain exogenous obesity Osteoarthritis mild hypoxemia  Social History: Smoking Status:  never  Physical Exam  General:  overweight-appearing.  blood pressure low normal; able to stand from a sitting position and ambulates to the examining table without difficulty.  No pain at present Lungs:  Normal respiratory effort, chest expands symmetrically. Lungs are clear to auscultation, no crackles or wheezes. Heart:  Normal rate and regular rhythm. S1 and S2 normal without gallop, murmur, click, rub or other extra sounds. Msk:  negative straight leg test   Impression & Recommendations:  Problem # 1:  OSTEOARTHRITIS (ICD-715.90)  Her updated medication list for this problem includes:    Tramadol Hcl 50 Mg Tabs  (Tramadol hcl) .Marland Kitchen... 1 q6h as needed    Vicodin Es 7.5-750 Mg Tabs (Hydrocodone-acetaminophen) .Marland Kitchen... 1/2 three times a day as needed    Aspir-low 81 Mg Tbec (Aspirin) .Marland Kitchen... 1 once daily  Problem # 2:  BACK PAIN, THORACIC REGION, RIGHT (ICD-724.1)  Her updated medication list for this problem includes:    Tramadol Hcl 50 Mg Tabs (Tramadol hcl) .Marland Kitchen... 1 q6h as needed    Vicodin Es 7.5-750 Mg Tabs (Hydrocodone-acetaminophen) .Marland Kitchen... 1/2 three times a day as needed    Aspir-low 81 Mg Tbec (Aspirin) .Marland Kitchen... 1 once daily  Complete Medication List: 1)  Hydrochlorothiazide 25 Mg Tabs (Hydrochlorothiazide) .Marland Kitchen.. 1 once daily 2)  Tramadol Hcl 50 Mg Tabs (Tramadol hcl) .Marland Kitchen.. 1 q6h as needed 3)  Meclizine Hcl 25 Mg Tabs (Meclizine hcl) .... As needed vertigo 4)  Trazadone 50 Mg  .... As needed for sleep 5)  Vicodin Es 7.5-750 Mg Tabs (Hydrocodone-acetaminophen) .... 1/2 three times a day as needed 6)  Aspir-low 81 Mg Tbec (Aspirin) .Marland Kitchen.. 1 once daily 7)  Omeprazole 20 Mg Cpdr (Omeprazole) .Marland Kitchen.. 1 two times a day 8)  Crestor 10 Mg Tabs (Rosuvastatin calcium) .... One daily 9)  Os-cal 500 + D 500-200 Mg-unit Tabs (Calcium carbonate-vitamin d) .... One twice daily 10)  Fish Oil 1000 Mg Caps (Omega-3 fatty acids) .... Qd 11)  Multivitamins Caps (Multiple vitamin) .... Qd 12)  Valacyclovir Hcl 500 Mg Tabs (Valacyclovir hcl) .... One twice daily 13)  Mupirocin 2 % Oint (Mupirocin) .... Use twice daily as needed  Patient Instructions: 1)  Most patients (90%) with low back pain will improve with time (2-6 weeks). Keep active but avoid activities that are painful. Apply moist heat  to lower back several times a day.   Orders Added: 1)  Est. Patient Level III [id number]

## 2010-08-09 NOTE — Assessment & Plan Note (Signed)
Summary: 6 month follow up/cjr   Vital Signs:  Patient profile:   77 year old female Weight:      191 pounds Temp:     98.6 degrees F oral BP sitting:   126 / 80  (right arm) Cuff size:   regular  Vitals Entered By: Cay Schillings LPN (March  4, 624THL [id number] PM) CC: 6 mos ROV - fx (L) humerus in sept 2010 - had repair, still in physcial therapy Is Patient Diabetic? No   CC:  6 mos ROV - fx (L) humerus in sept 2010 - had repair and still in physcial therapy.  History of Present Illness: an 77 year old patient who is seen today for follow-up of her hypertension and dyslipidemia.  She has osteoarthritis and chronic low back pain.  Since her last visit here is required surgery for a left humeral fracture.  Doing quite well.  Denies any cardiopulmonary complaints.  She also has a history of gastroesophageal reflux disease, which has been stable.  Preventive Screening-Counseling & Management  Alcohol-Tobacco     Smoking Status: quit  Allergies: 1)  ! Penicillin G Pot in Dextrose (Penicillin G Potassium in D5w) 2)  ! Feldene  Past History:  Past Medical History: Reviewed history from 01/16/2009 and no changes required. Hyperlipidemia Hypertension DJD Low back pain Knee pain exogenous obesity Osteoarthritis  Past Surgical History: Appendectomy Cholecystectomy Hysterectomy Tonsillectomy cataract surgery OD  colonoscopy none- prior sigmoidoscopy 2003 surgery for fracture, left humerus September 2010  Family History: Reviewed history from 01/16/2009 and no changes required. Father  ded age 32: cerebrovascular disease CAD, prostate cancer mother died at age 97, senile dementia one sister died of a lymphoma  Review of Systems  The patient denies anorexia, fever, weight loss, weight gain, vision loss, decreased hearing, hoarseness, chest pain, syncope, dyspnea on exertion, peripheral edema, prolonged cough, headaches, hemoptysis, abdominal pain, melena, hematochezia,  severe indigestion/heartburn, hematuria, incontinence, genital sores, muscle weakness, suspicious skin lesions, transient blindness, difficulty walking, depression, unusual weight change, abnormal bleeding, enlarged lymph nodes, angioedema, and breast masses.    Physical Exam  General:  overweight-appearing.  130/80overweight-appearing.   Head:  Normocephalic and atraumatic without obvious abnormalities. No apparent alopecia or balding. Eyes:  No corneal or conjunctival inflammation noted. EOMI. Perrla. Funduscopic exam benign, without hemorrhages, exudates or papilledema. Vision grossly normal. Ears:  External ear exam shows no significant lesions or deformities.  Otoscopic examination reveals clear canals, tympanic membranes are intact bilaterally without bulging, retraction, inflammation or discharge. Hearing is grossly normal bilaterally. Mouth:  Oral mucosa and oropharynx without lesions or exudates.  Teeth in good repair. Neck:  No deformities, masses, or tenderness noted. Lungs:  Normal respiratory effort, chest expands symmetrically. Lungs are clear to auscultation, no crackles or wheezes. Heart:  Normal rate and regular rhythm. S1 and S2 normal without gallop, murmur, click, rub or other extra sounds. Abdomen:  Bowel sounds positive,abdomen soft and non-tender without masses, organomegaly or hernias noted. Msk:  No deformity or scoliosis noted of thoracic or lumbar spine.   Pulses:  R and L carotid,radial,femoral,dorsalis pedis and posterior tibial pulses are full and equal bilaterally Extremities:  No clubbing, cyanosis, edema, or deformity noted with normal full range of motion of all joints.   Skin:  Intact without suspicious lesions or rashes Cervical Nodes:  No lymphadenopathy noted Psych:  Cognition and judgment appear intact. Alert and cooperative with normal attention span and concentration. No apparent delusions, illusions, hallucinations   Impression &  Recommendations:  Problem #  1:  OSTEOARTHRITIS (ICD-715.90)  Her updated medication list for this problem includes:    Tramadol Hcl 50 Mg Tabs (Tramadol hcl) .Marland Kitchen... 1 q6h as needed    Vicodin Es 7.5-750 Mg Tabs (Hydrocodone-acetaminophen) .Marland Kitchen... 1/2 three times a day as needed    Aspir-low 81 Mg Tbec (Aspirin) .Marland Kitchen... 1 once daily  Her updated medication list for this problem includes:    Tramadol Hcl 50 Mg Tabs (Tramadol hcl) .Marland Kitchen... 1 q6h as needed    Vicodin Es 7.5-750 Mg Tabs (Hydrocodone-acetaminophen) .Marland Kitchen... 1/2 three times a day as needed    Aspir-low 81 Mg Tbec (Aspirin) .Marland Kitchen... 1 once daily  Problem # 2:  HYPERTENSION (ICD-401.9)  Her updated medication list for this problem includes:    Hydrochlorothiazide 25 Mg Tabs (Hydrochlorothiazide) .Marland Kitchen... 1 once daily  Her updated medication list for this problem includes:    Hydrochlorothiazide 25 Mg Tabs (Hydrochlorothiazide) .Marland Kitchen... 1 once daily  Problem # 3:  HYPERLIPIDEMIA (ICD-272.4)  The following medications were removed from the medication list:    Crestor 5 Mg Tabs (Rosuvastatin calcium) .Marland Kitchen... 1 once daily Her updated medication list for this problem includes:    Crestor 10 Mg Tabs (Rosuvastatin calcium) ..... One daily  The following medications were removed from the medication list:    Crestor 5 Mg Tabs (Rosuvastatin calcium) .Marland Kitchen... 1 once daily Her updated medication list for this problem includes:    Crestor 10 Mg Tabs (Rosuvastatin calcium) ..... One daily  Complete Medication List: 1)  Hydrochlorothiazide 25 Mg Tabs (Hydrochlorothiazide) .Marland Kitchen.. 1 once daily 2)  Tramadol Hcl 50 Mg Tabs (Tramadol hcl) .Marland Kitchen.. 1 q6h as needed 3)  Meclizine Hcl 25 Mg Tabs (Meclizine hcl) .... As needed vertigo 4)  Trazadone 50 Mg  .... As needed for sleep 5)  Flexeril 10 Mg Tabs (Cyclobenzaprine hcl) .... 1/2 three times a day as needed 6)  Vicodin Es 7.5-750 Mg Tabs (Hydrocodone-acetaminophen) .... 1/2 three times a day as needed 7)  Aspir-low 81  Mg Tbec (Aspirin) .Marland Kitchen.. 1 once daily 8)  Omeprazole 20 Mg Cpdr (Omeprazole) .Marland Kitchen.. 1 two times a day 9)  Crestor 10 Mg Tabs (Rosuvastatin calcium) .... One daily  Patient Instructions: 1)  Limit your Sodium (Salt). 2)  It is important that you exercise regularly at least 20 minutes 5 times a week. If you develop chest pain, have severe difficulty breathing, or feel very tired , stop exercising immediately and seek medical attention. 3)  You need to lose weight. Consider a lower calorie diet and regular exercise.  4)  Advised not to eat any food or drink any liquids after 10 PM the night before your procedure. 5)  CPX of August 2011 Prescriptions: CRESTOR 10 MG TABS (ROSUVASTATIN CALCIUM) one daily  #90 x 5   Entered and Authorized by:   Marletta Lor  MD   Signed by:   Marletta Lor  MD on 09/08/2009   Method used:   Print then Give to Patient   RxID:   [id number] OMEPRAZOLE 20 MG CPDR (OMEPRAZOLE) 1 two times a day  #180 x 3   Entered and Authorized by:   Marletta Lor  MD   Signed by:   Marletta Lor  MD on 09/08/2009   Method used:   Print then Give to Patient   RxID:   [id number] VICODIN ES 7.5-750 MG  TABS (HYDROCODONE-ACETAMINOPHEN) 1/2 three times a day as needed  #90 x 4   Entered and Authorized by:  Marletta Lor  MD   Signed by:   Marletta Lor  MD on 09/08/2009   Method used:   Print then Give to Patient   RxID:   [id number] TRAMADOL HCL 50 MG  TABS (TRAMADOL HCL) 1 q6h as needed  #90 x 6   Entered and Authorized by:   Marletta Lor  MD   Signed by:   Marletta Lor  MD on 09/08/2009   Method used:   Print then Give to Patient   RxID:   [id number] HYDROCHLOROTHIAZIDE 25 MG  TABS (HYDROCHLOROTHIAZIDE) 1 once daily  #90 x 6   Entered and Authorized by:   Marletta Lor  MD   Signed by:   Marletta Lor  MD on 09/08/2009   Method used:   Print then Give to Patient   RxID:   [id number]

## 2010-08-21 ENCOUNTER — Encounter: Payer: Self-pay | Admitting: Internal Medicine

## 2010-08-21 ENCOUNTER — Ambulatory Visit (INDEPENDENT_AMBULATORY_CARE_PROVIDER_SITE_OTHER): Payer: PRIVATE HEALTH INSURANCE | Admitting: Internal Medicine

## 2010-08-21 DIAGNOSIS — I10 Essential (primary) hypertension: Secondary | ICD-10-CM

## 2010-08-21 DIAGNOSIS — E785 Hyperlipidemia, unspecified: Secondary | ICD-10-CM

## 2010-08-21 DIAGNOSIS — M1990 Unspecified osteoarthritis, unspecified site: Secondary | ICD-10-CM

## 2010-08-21 NOTE — Patient Instructions (Signed)
Limit your sodium (Salt) intake    It is important that you exercise regularly, at least 20 minutes 3 to 4 times per week.  If you develop chest pain or shortness of breath seek  medical attention.  You need to lose weight.  Consider a lower calorie diet and regular exercise.  Return in 3 months for follow-up  

## 2010-08-21 NOTE — Progress Notes (Signed)
  Subjective:    Patient ID: Shelley Robinson, female    DOB: 1927/02/01, 77 y.o.   MRN: [id number]  HPI  77 year old patient who is seen today for follow-up of her hypertension.  She has significant nostra arthritis with pain in the knees and back area, but has been fairly stable of late.  She states that she is taking no pain medications at present other than occasional Tylenol.  She feels quite well.  Today    Review of Systems  Constitutional: Negative.   HENT: Negative for hearing loss, congestion, sore throat, rhinorrhea, dental problem, sinus pressure and tinnitus.   Eyes: Negative for pain, discharge and visual disturbance.  Respiratory: Negative for cough and shortness of breath.   Cardiovascular: Negative for chest pain, palpitations and leg swelling.  Gastrointestinal: Negative for nausea, vomiting, abdominal pain, diarrhea, constipation, blood in stool and abdominal distention.  Genitourinary: Negative for dysuria, urgency, frequency, hematuria, flank pain, vaginal bleeding, vaginal discharge, difficulty urinating, vaginal pain and pelvic pain.  Musculoskeletal: Positive for back pain. Negative for joint swelling, arthralgias and gait problem.  Skin: Negative for rash.  Neurological: Negative for dizziness, syncope, speech difficulty, weakness, numbness and headaches.  Hematological: Negative for adenopathy.  Psychiatric/Behavioral: Negative for behavioral problems, dysphoric mood and agitation. The patient is not nervous/anxious.        Objective:   Physical Exam  Constitutional: She is oriented to person, place, and time. She appears well-developed and well-nourished. No distress.       obese  HENT:  Head: Normocephalic.  Right Ear: External ear normal.  Left Ear: External ear normal.  Mouth/Throat: Oropharynx is clear and moist.  Eyes: Conjunctivae and EOM are normal. Pupils are equal, round, and reactive to light.  Neck: Normal range of motion. Neck supple. No  thyromegaly present.  Cardiovascular: Normal rate, regular rhythm, normal heart sounds and intact distal pulses.   Pulmonary/Chest: Effort normal and breath sounds normal.  Abdominal: Soft. Bowel sounds are normal. She exhibits no mass. There is no tenderness.  Musculoskeletal: Normal range of motion.  Lymphadenopathy:    She has no cervical adenopathy.  Neurological: She is alert and oriented to person, place, and time.  Skin: Skin is warm and dry. No rash noted.  Psychiatric: She has a normal mood and affect. Her behavior is normal.          Assessment & Plan:  Hypertension stable Osteo-arthritis, stable Dyslipidemia

## 2010-09-03 ENCOUNTER — Other Ambulatory Visit: Payer: Self-pay | Admitting: Internal Medicine

## 2010-09-03 DIAGNOSIS — Z1231 Encounter for screening mammogram for malignant neoplasm of breast: Secondary | ICD-10-CM

## 2010-09-11 ENCOUNTER — Other Ambulatory Visit: Payer: Self-pay | Admitting: Internal Medicine

## 2010-09-21 ENCOUNTER — Ambulatory Visit
Admission: RE | Admit: 2010-09-21 | Discharge: 2010-09-21 | Disposition: A | Discharge status: Home / Self Care | Payer: Medicare Other | Source: Ambulatory Visit | Attending: Internal Medicine | Admitting: Internal Medicine

## 2010-09-21 DIAGNOSIS — Z1231 Encounter for screening mammogram for malignant neoplasm of breast: Secondary | ICD-10-CM

## 2010-11-19 ENCOUNTER — Ambulatory Visit: Payer: PRIVATE HEALTH INSURANCE | Admitting: Internal Medicine

## 2010-11-22 ENCOUNTER — Ambulatory Visit (INDEPENDENT_AMBULATORY_CARE_PROVIDER_SITE_OTHER): Payer: Medicare Other | Admitting: Internal Medicine

## 2010-11-22 ENCOUNTER — Ambulatory Visit (INDEPENDENT_AMBULATORY_CARE_PROVIDER_SITE_OTHER)
Admission: RE | Admit: 2010-11-22 | Discharge: 2010-11-22 | Disposition: A | Discharge status: Home / Self Care | Payer: Medicare Other | Source: Ambulatory Visit | Attending: Internal Medicine | Admitting: Internal Medicine

## 2010-11-22 ENCOUNTER — Encounter: Payer: Self-pay | Admitting: Internal Medicine

## 2010-11-22 ENCOUNTER — Ambulatory Visit: Payer: PRIVATE HEALTH INSURANCE | Admitting: Internal Medicine

## 2010-11-22 DIAGNOSIS — M1990 Unspecified osteoarthritis, unspecified site: Secondary | ICD-10-CM

## 2010-11-22 DIAGNOSIS — E785 Hyperlipidemia, unspecified: Secondary | ICD-10-CM

## 2010-11-22 DIAGNOSIS — R0609 Other forms of dyspnea: Secondary | ICD-10-CM

## 2010-11-22 DIAGNOSIS — I10 Essential (primary) hypertension: Secondary | ICD-10-CM

## 2010-11-22 DIAGNOSIS — R0989 Other specified symptoms and signs involving the circulatory and respiratory systems: Secondary | ICD-10-CM

## 2010-11-22 NOTE — Patient Instructions (Signed)
Limit your sodium (Salt) intake  Chest x-ray as scheduled  You need to lose weight.  Consider a lower calorie diet and regular exercise.  Return in 3 months for follow-up

## 2010-11-22 NOTE — Progress Notes (Signed)
  Subjective:    Patient ID: Shelley Robinson, female    DOB: [birth year], 77 y.o.   MRN: [id number]  HPI 77 year old patient who is seen today for followup. Medical problems include hypertension and dyslipidemia. She has a history of osteoarthritis and chronic low back pain. She has exogenous obesity. She relates a history of dyspnea on exertion. It seems this bothers her daughter more than the patient. She feels this has been an ongoing problem for a least 2 years but perhaps has intensified more recently. Denies any exertional chest pain but does get short of breath quite easily with exertion denies any PND orthopnea or peripheral edema.  She has remote tobacco use but has not smoked in 25 years. Denies any productive cough and EKG was reviewed from the fall of last year that revealed a normal sinus rhythm and suggested the possibility of a prior anteroseptal infarct with poor R-wave progression from V1 to V3;  there is low voltage.   Review of Systems  Constitutional: Negative.   HENT: Negative for hearing loss, congestion, sore throat, rhinorrhea, dental problem, sinus pressure and tinnitus.   Eyes: Negative for pain, discharge and visual disturbance.  Respiratory: Positive for shortness of breath. Negative for cough.   Cardiovascular: Negative for chest pain, palpitations and leg swelling.  Gastrointestinal: Negative for nausea, vomiting, abdominal pain, diarrhea, constipation, blood in stool and abdominal distention.  Genitourinary: Negative for dysuria, urgency, frequency, hematuria, flank pain, vaginal bleeding, vaginal discharge, difficulty urinating, vaginal pain and pelvic pain.  Musculoskeletal: Negative for joint swelling, arthralgias and gait problem.  Skin: Negative for rash.  Neurological: Negative for dizziness, syncope, speech difficulty, weakness, numbness and headaches.  Hematological: Negative for adenopathy.  Psychiatric/Behavioral: Negative for behavioral problems, dysphoric  mood and agitation. The patient is not nervous/anxious.        Objective:   Physical Exam  Constitutional: She is oriented to person, place, and time. She appears well-developed and well-nourished. No distress.       Obese  HENT:  Head: Normocephalic.  Right Ear: External ear normal.  Left Ear: External ear normal.  Mouth/Throat: Oropharynx is clear and moist.  Eyes: Conjunctivae and EOM are normal. Pupils are equal, round, and reactive to light.  Neck: Normal range of motion. Neck supple. No thyromegaly present.  Cardiovascular: Normal rate, regular rhythm, normal heart sounds and intact distal pulses.   Pulmonary/Chest: Effort normal and breath sounds normal.       Breast sounds appear to be slightly diminished on the left with a few crackles A few faint expiratory rhonchi are noted anteriorly Resting O2 saturation 93. This remained at 93 after an approximate 50 yard walk. Pulse rate went up from 70-100 and she was moderately dyspneic  Abdominal: Soft. Bowel sounds are normal. She exhibits no mass. There is no tenderness.  Musculoskeletal: Normal range of motion.  Lymphadenopathy:    She has no cervical adenopathy.  Neurological: She is alert and oriented to person, place, and time.  Skin: Skin is warm and dry. No rash noted.  Psychiatric: She has a normal mood and affect. Her behavior is normal.          Assessment & Plan:  Dyspnea on exertion. Probably multifactorial. We'll check a 2-D echocardiogram to assess LV function and wall motion. We'll check a chest x-ray Hypertension stable Dyslipidemia Obesity and deconditioning Remote tobacco use

## 2010-11-23 ENCOUNTER — Telehealth: Payer: Self-pay | Admitting: Internal Medicine

## 2010-11-23 NOTE — Telephone Encounter (Signed)
Pt called req xray results. Pls call

## 2010-11-23 NOTE — Telephone Encounter (Signed)
Please notify that is normal

## 2010-11-23 NOTE — Discharge Summary (Signed)
   Shelley Robinson, Shelley Robinson                       ACCOUNT NO.:  [id number]   MEDICAL RECORD NO.:  [id number]                   PATIENT TYPE:  INP   LOCATION:  5024                                 FACILITY:  [facility]   PHYSICIAN:  Marletta Lor, M.D. [facility]      DATE OF BIRTH:  [birth year]   DATE OF ADMISSION:  DATE OF DISCHARGE:  07/03/2002                                 DISCHARGE SUMMARY   FINAL DIAGNOSIS:  Cellulitis, left lower extremity.   HISTORY OF PRESENT ILLNESS:  The patient is a 77 year old white female who  sustained a traumatic injury to her left knee area on 05/10/02.  This  required suturing at an urgent care and approximately 12 days later, the  sutures were removed.  She developed a secondary infection which was treated  with Avelox.  For the past several days, the patient has been quite active  physically and has had increasing pain in swelling of the left knee.  She  was admitted with suspected cellulitis.   LABORATORY DATA AND HOSPITAL COURSE:  The patient was admitted to the  hospital.  She was placed at bed rest with leg elevation, and was treated  with warm compresses.  She was placed on IV Tequin due to a penicillin  allergy.  X-ray of the leg revealed diffuse soft-tissue swelling and edema,  but no focal osseous abnormalities.  She was afebrile.  A white blood cell  count was normal.   DISPOSITION:  The patient was seen in consultation with orthopedic service.  They recommended a left leg immobilizer.  The patient was discharged to  complete seven additional days of antibiotic therapy, and have orthopedic  followup early next week.   DISCHARGE CONDITION:  Improved.                                               Marletta Lor, M.D. [facility]    PFK/MEDQ  D:  07/03/2002  T:  07/03/2002  Job:  [id number]

## 2010-11-23 NOTE — Assessment & Plan Note (Signed)
[facility] OFFICE NOTE   NAME:Shelley Robinson, Shelley Robinson                    MRN:          [id number]  DATE:05/19/2006                            DOB:          Jul 14, 1926    A 77 year old female seen today for an annual exam.  She has hypertension,  hyperlipidemia, DJD.  The only complaint is low back pain.  She has had a  remote hysterectomy.  She was last hospitalized in 2003 for cellulitis.  She  has also had a remote appendectomy, cholecystectomy, and tonsillectomy.  She  is a gravida 3, para 3, abortus 0.   REVIEW OF SYSTEMS:  Negative.  Did have sigmoidoscopy 5 years ago.  Did have  a Cardiolite stress test in 2005.   FAMILY HISTORY:  Basically unchanged.  One brother, one sister deceased.  Father died of prostate cancer.  Mother had senile dementia.   PHYSICAL EXAMINATION:  GENERAL:  Obese white female in no acute distress.  VITAL SIGNS:  Blood pressure 120/80.  HEENT:  Clear.  NECK:  No bruits.  CHEST:  Clear.  BREASTS:    Negative.  CARDIOVASCULAR:  Normal heart sounds.  No murmurs.  ABDOMEN:  Obese, soft, and nontender.  No organomegaly.  PELVIC:  Absent uterus.  No adnexal masses.  Stool heme-negative.  EXTREMITIES:  Absent right dorsalis pedis pulses.  NEUROLOGIC:  Negative.   IMPRESSION:  1. Hypertension.  2. Hyperlipidemia.  3. Degenerative joint disease with low back pain.  4. Medical regimen unchanged.  5. She is on naproxen b.i.d., and it is good pain control from tramadol.  6. Will recheck in 3 or 4 months.    ______________________________  Marletta Lor, MD    PFK/MedQ  DD: 05/19/2006  DT: 05/19/2006  Job #: [id number]

## 2010-11-23 NOTE — H&P (Signed)
Shelley Robinson, Shelley Robinson                       ACCOUNT NO.:  [id number]   MEDICAL RECORD NO.:  [id number]                   PATIENT TYPE:  INP   LOCATION:  5024                                 FACILITY:  [facility]   PHYSICIAN:  Dellis Filbert A. Sherren Mocha, M.D. [facility]           DATE OF BIRTH:  [birth year]   DATE OF ADMISSION:  07/02/2002  DATE OF DISCHARGE:                                HISTORY & PHYSICAL   TIME OF ADMISSION:  12:00 noon.   PRIMARY CARE PHYSICIAN:  Marletta Lor, M.D. [facility]   HISTORY OF PRESENT ILLNESS:  This 77 year old, married, white female, was  admitted to the hospital for treatment of cellulitis of her left lower  extremity.   The patient states she went to urgent care about the first week in November  for a laceration across the anterior portion of her knee.  She had 12  stitches which were subsequently removed by Dr. Burnice Logan on 06/02/2002.  At that time, the wound appeared normal.  She came back on 06/08/2002, a  week later, with some erythema around the wound.  Exam at that time was  negative.  It was treated symptomatically with hot soaks, antibiotics and  she was started on Avalox 1 g for 10 days.  She states it got better, but  did not completely resolve and then three days prior to admission the  redness and swelling became worse.  One day prior to admission she noted  some blisters and swelling on her left lower extremity.   The patient presented to the office with those symptoms.  Because of the  extreme swelling and redness of her knee and lower extremity, she was  admitted to the hospital for cultures, IV antibiotics and rule out osteo and  DVT.   PAST MEDICAL HISTORY:  She was hospitalized for hysterectomy, appendectomy,  T&A, cholecystectomy, and childbirth x 3.  Past illnesses none.  Injuries  none.   DRUG ALLERGIES:  She states she is allergic to ASPIRIN which makes her ears  ring which is not an allergic reaction.  She does not get hives.  She  says  PENICILLIN and FELDENE gives her hives.   HABITS:  She does not smoke or drink any alcohol.   CURRENT MEDICATIONS:  1. Aspirin 81 mg. q.d.  2. Hydrochlorothiazide 25 mg. q.d.  3. Trazodone 50 mg. q.h.s. for sleep.   REVIEW OF SYSTEMS:  Noncontributory.   SOCIAL HISTORY:  She is married and lives with her husband who is disabled  and walks with a walker.  She originally had three children and one died.  She has two remaining children.   FAMILY HISTORY:  Dad died in his 57s, CVA, MI and prostate cancer.  Mom died  in her 66s of Alzheimer disease.  No brothers, one sister died of lymphoma.   IMMUNIZATIONS:  Last tetanus was 2003, when she had her laceration at urgent  care.  PHYSICAL EXAMINATION:  VITAL SIGNS:  Weight 197, temperature  97.9, pulse 70  and regular, respirations 12 and regular, blood pressure 160/92.  GENERAL:  She is a well-developed, well-nourished, elderly white female, in  no acute distress.  HEENT:  Negative, except she wears glasses and has dentures.  NECK:  Supple.  Thyroid was not enlarged.  LUNGS:  Clear to auscultation.  CARDIAC:  Negative.  ABDOMEN:  Negative.  EXTERNAL GENITALIA/RECTUM:  Deferred.  EXTREMITIES:  Normal.  Skin normal.  Peripheral pulses normal, except for  left lower extremity.  There is a well-healed laceration across the left  knee medially all the way anteriorly into the lateral aspect.  There is 3-4  eschars from the old wound.  There is marked erythema with the left knee  being twice the size of the right, with extreme erythema and swelling at 3+  in the lower extremities.  Peripheral pulses are 2+/4.   IMPRESSION:  1. Cellulitis, left lower extremity, ? deep venous thrombosis, ? osteo.  2. Status post childbirth x 3.  3. Status post hysterectomy.  4. Status post appendectomy.  5. Status post tonsillectomy and adenoidectomy.  6. Status post cholecystectomy.   PLAN:  Admit.  IV antibiotics.  Culture.  X-ray knee.  May  need Doppler of  lower extremity to rule out DVT.  May need scan to rule out osteo.  We  initially discussed the case with Phoebe Sharps, who recommended Unasyn,  which I wrote on admission orders.  However, the nurse from the floor kindly  reviewed my orders, sent it to pharmacy and I had put on the sheet that she  was allergic to PENICILLIN.  They caught that and called me, and I asked  them to call Dr. Burnice Logan, who is the hospital physician, to review her  status and recommend antibiotics, since she does have a history of  PENICILLIN sensitivity.                                               Jeffrey A. Sherren Mocha, M.D. [facility]    JAT/MEDQ  D:  07/02/2002  T:  07/02/2002  Job:  [id number]

## 2010-11-23 NOTE — Telephone Encounter (Signed)
Called.

## 2010-12-04 ENCOUNTER — Ambulatory Visit (HOSPITAL_COMMUNITY): Payer: Medicare Other | Attending: Cardiology | Admitting: Radiology

## 2010-12-04 DIAGNOSIS — R0989 Other specified symptoms and signs involving the circulatory and respiratory systems: Secondary | ICD-10-CM | POA: Insufficient documentation

## 2010-12-04 DIAGNOSIS — E785 Hyperlipidemia, unspecified: Secondary | ICD-10-CM | POA: Insufficient documentation

## 2010-12-04 DIAGNOSIS — R0609 Other forms of dyspnea: Secondary | ICD-10-CM | POA: Insufficient documentation

## 2010-12-04 DIAGNOSIS — I10 Essential (primary) hypertension: Secondary | ICD-10-CM | POA: Insufficient documentation

## 2010-12-05 NOTE — Progress Notes (Signed)
Quick Note:  Spoke with pt - informed of results. KIK ______

## 2011-02-21 ENCOUNTER — Encounter: Payer: Self-pay | Admitting: Internal Medicine

## 2011-02-21 ENCOUNTER — Ambulatory Visit (INDEPENDENT_AMBULATORY_CARE_PROVIDER_SITE_OTHER): Payer: Medicare Other | Admitting: Internal Medicine

## 2011-02-21 DIAGNOSIS — M546 Pain in thoracic spine: Secondary | ICD-10-CM

## 2011-02-21 DIAGNOSIS — M545 Low back pain: Secondary | ICD-10-CM

## 2011-02-21 DIAGNOSIS — I10 Essential (primary) hypertension: Secondary | ICD-10-CM

## 2011-02-21 DIAGNOSIS — M1990 Unspecified osteoarthritis, unspecified site: Secondary | ICD-10-CM

## 2011-02-21 MED ORDER — TRAZODONE HCL 50 MG PO TABS: 50.0000 mg | ORAL_TABLET | Freq: Every day | ORAL | Status: DC

## 2011-02-21 MED ORDER — HYDROCODONE-ACETAMINOPHEN 7.5-750 MG PO TABS: 0.5000 | ORAL_TABLET | Freq: Three times a day (TID) | ORAL | Status: DC | PRN

## 2011-02-21 MED ORDER — OMEPRAZOLE 20 MG PO CPDR: 20.0000 mg | DELAYED_RELEASE_CAPSULE | Freq: Two times a day (BID) | ORAL | Status: DC

## 2011-02-21 MED ORDER — HYDROCHLOROTHIAZIDE 25 MG PO TABS: 25.0000 mg | ORAL_TABLET | Freq: Every day | ORAL | Status: DC

## 2011-02-21 MED ORDER — TRAMADOL HCL 50 MG PO TABS: 50.0000 mg | ORAL_TABLET | Freq: Four times a day (QID) | ORAL | Status: DC | PRN

## 2011-02-21 NOTE — Progress Notes (Signed)
  Subjective:    Patient ID: Shelley Robinson, female    DOB: [birth year], 77 y.o.   MRN: [id number]  HPI  77 year old patient has a history of chronic low back pain as well as treated hypertension. She has osteoarthritis the past several weeks has had worsening pain involving the right lobe posterior back area. This has been refractory to Tylenol. She has used tramadol and Vicodin both in the past. She has treated hypertension on diuretic therapy. Blood pressure on arrival today 160/100    Review of Systems  Constitutional: Negative.   HENT: Negative for hearing loss, congestion, sore throat, rhinorrhea, dental problem, sinus pressure and tinnitus.   Eyes: Negative for pain, discharge and visual disturbance.  Respiratory: Negative for cough and shortness of breath.   Cardiovascular: Negative for chest pain, palpitations and leg swelling.  Gastrointestinal: Negative for nausea, vomiting, abdominal pain, diarrhea, constipation, blood in stool and abdominal distention.  Genitourinary: Negative for dysuria, urgency, frequency, hematuria, flank pain, vaginal bleeding, vaginal discharge, difficulty urinating, vaginal pain and pelvic pain.  Musculoskeletal: Positive for back pain and gait problem. Negative for joint swelling and arthralgias.  Skin: Negative for rash.  Neurological: Negative for dizziness, syncope, speech difficulty, weakness, numbness and headaches.  Hematological: Negative for adenopathy.  Psychiatric/Behavioral: Negative for behavioral problems, dysphoric mood and agitation. The patient is not nervous/anxious.        Objective:   Physical Exam  Constitutional: She is oriented to person, place, and time. She appears well-developed and well-nourished.  HENT:  Head: Normocephalic.  Right Ear: External ear normal.  Left Ear: External ear normal.  Mouth/Throat: Oropharynx is clear and moist.  Eyes: Conjunctivae and EOM are normal. Pupils are equal, round, and reactive to light.    Neck: Normal range of motion. Neck supple. No thyromegaly present.  Cardiovascular: Normal rate, regular rhythm, normal heart sounds and intact distal pulses.   Pulmonary/Chest: Effort normal and breath sounds normal.  Abdominal: Soft. Bowel sounds are normal. She exhibits no mass. There is no tenderness.  Musculoskeletal: Normal range of motion.       Tenderness in the lumbar region also is slightly tender in the right mid posterolateral chest wall region  Lymphadenopathy:    She has no cervical adenopathy.  Neurological: She is alert and oriented to person, place, and time.  Skin: Skin is warm and dry. No rash noted.  Psychiatric: She has a normal mood and affect. Her behavior is normal.          Assessment & Plan:   Hypertension stable Osteoarthritis with back pain. Will refill her pain medications including Vicodin. Dyslipidemia. Gastroesophageal reflux disease

## 2011-02-21 NOTE — Patient Instructions (Signed)
Limit your sodium (Salt) intake  Return in 3 months for follow-up   

## 2011-05-03 ENCOUNTER — Ambulatory Visit (INDEPENDENT_AMBULATORY_CARE_PROVIDER_SITE_OTHER): Payer: Medicare Other

## 2011-05-03 DIAGNOSIS — Z23 Encounter for immunization: Secondary | ICD-10-CM

## 2011-05-08 ENCOUNTER — Other Ambulatory Visit: Payer: Self-pay

## 2011-05-08 MED ORDER — OMEPRAZOLE 20 MG PO CPDR: 20.0000 mg | DELAYED_RELEASE_CAPSULE | Freq: Two times a day (BID) | ORAL | Status: DC

## 2011-06-17 ENCOUNTER — Encounter: Payer: Self-pay | Admitting: Internal Medicine

## 2011-06-17 ENCOUNTER — Ambulatory Visit (INDEPENDENT_AMBULATORY_CARE_PROVIDER_SITE_OTHER): Payer: Medicare Other | Admitting: Internal Medicine

## 2011-06-17 DIAGNOSIS — Z23 Encounter for immunization: Secondary | ICD-10-CM

## 2011-06-17 DIAGNOSIS — M1990 Unspecified osteoarthritis, unspecified site: Secondary | ICD-10-CM

## 2011-06-17 DIAGNOSIS — M545 Low back pain, unspecified: Secondary | ICD-10-CM

## 2011-06-17 DIAGNOSIS — E785 Hyperlipidemia, unspecified: Secondary | ICD-10-CM

## 2011-06-17 DIAGNOSIS — Z0000 Encounter for general adult medical examination without abnormal findings: Secondary | ICD-10-CM

## 2011-06-17 DIAGNOSIS — I10 Essential (primary) hypertension: Secondary | ICD-10-CM

## 2011-06-17 LAB — CBC WITH DIFFERENTIAL/PLATELET
Basophils Relative: 0.5 % (ref 0.0–3.0)
Hemoglobin: 15.1 g/dL — ABNORMAL HIGH (ref 12.0–15.0)
Lymphocytes Relative: 26.4 % (ref 12.0–46.0)
Monocytes Relative: 9.3 % (ref 3.0–12.0)
Neutro Abs: 2.7 10*3/uL (ref 1.4–7.7)
RBC: 4.61 Mil/uL (ref 3.87–5.11)
WBC: 4.4 10*3/uL — ABNORMAL LOW (ref 4.5–10.5)

## 2011-06-17 LAB — COMPREHENSIVE METABOLIC PANEL
BUN: 22 mg/dL (ref 6–23)
CO2: 34 mEq/L — ABNORMAL HIGH (ref 19–32)
Calcium: 9.4 mg/dL (ref 8.4–10.5)
Chloride: 104 mEq/L (ref 96–112)
Creatinine, Ser: 1.1 mg/dL (ref 0.4–1.2)
GFR: 48.93 mL/min — ABNORMAL LOW (ref >60.00)
Glucose, Bld: 105 mg/dL — ABNORMAL HIGH (ref 70–99)

## 2011-06-17 LAB — LIPID PANEL
Cholesterol: 178 mg/dL (ref 0–200)
LDL Cholesterol: 71 mg/dL (ref 0–99)
Triglycerides: 174 mg/dL — ABNORMAL HIGH (ref 0.0–149.0)

## 2011-06-17 MED ORDER — ROSUVASTATIN CALCIUM 10 MG PO TABS: 10.0000 mg | ORAL_TABLET | Freq: Every day | ORAL | Status: DC

## 2011-06-17 MED ORDER — HYDROCHLOROTHIAZIDE 25 MG PO TABS: 25.0000 mg | ORAL_TABLET | Freq: Every day | ORAL | Status: DC

## 2011-06-17 MED ORDER — OMEPRAZOLE 20 MG PO CPDR: 20.0000 mg | DELAYED_RELEASE_CAPSULE | Freq: Two times a day (BID) | ORAL | Status: DC

## 2011-06-17 NOTE — Patient Instructions (Signed)

## 2011-06-17 NOTE — Progress Notes (Signed)
Subjective:    Patient ID: Shelley Robinson, female    DOB: Oct 14, 1926, 77 y.o.   MRN: [id number]  HPI CC: cpx - doing ok.   History of Present Illness:   77 year-old patient who is seen today for a comprehensive evaluation. She fractured her left humerus in October of last year and had surgery at [facility]. She states that she had a 2-D echocardiogram at that time. She has long a long history of dyspnea on exertion. She has treated hypertension, dyslipidemia, and significant osteoarthritis. She has a history of exogenous obesity.   Here for Medicare AWV:   1. Risk factors based on Past M, S, F history: risk factors include hypertension, and dyslipidemia.  2. Physical Activities: very sedentary due to obesity, arthritis, and dyspnea on exertion  3. Depression/mood: history depression, or mood disorder  4. Hearing: no deficits  5. ADL's: independent in all aspects of daily living, but sedentary  6. Fall Risk: moderate due to age, obesity, arthritis  7. Home Safety: no problems identified  8. Height, weight, &visual acuity: there continues to be modest weight gain. No difficulty with visual acuity  9. Counseling: weight loss encouraged  10. Labs ordered based on risk factors: laboratory profile, including lipid panel will be reviewed  11. Referral Coordination- a chest x-ray will be obtained  12. Care Plan- weight loss, heart healthy diet or exercise. All encouraged  13. Cognitive Assessment- alert and oriented, with a normal affect. She feels that she may have become slightly more forgetful. 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 No family concerns with memory   Preventive Screening-Counseling & Management  Alcohol-Tobacco  Smoking Status: quit   Allergies:  1) ! Penicillin G Pot in Dextrose (Penicillin G Potassium in D5w)  2) ! Feldene   Past History:  Past Medical History:  Hyperlipidemia  Hypertension  DJD  Low back pain  Knee pain  exogenous  obesity  Osteoarthritis  mild hypoxemia   Past Surgical History:  Appendectomy  Cholecystectomy  Hysterectomy  Tonsillectomy  cataract surgery OD  2 D Echo 10-11 ([facility])  colonoscopy none- prior sigmoidoscopy 2003  surgery for fracture, left humerus September 2010   Family History:  Reviewed history from 01/16/2009 and no changes required.  Father ded age 65: cerebrovascular disease CAD, prostate cancer  mother died at age 28, senile dementia  one sister died of a lymphoma   Social History:  Reviewed history from 01/16/2009 and no changes required.  3 children  Widow/[place]-October 2006    Review of Systems  Constitutional: Negative for fever, appetite change, fatigue and unexpected weight change.  HENT: Negative for hearing loss, ear pain, nosebleeds, congestion, sore throat, mouth sores, trouble swallowing, neck stiffness, dental problem, voice change, sinus pressure and tinnitus.   Eyes: Negative for photophobia, pain, redness and visual disturbance.  Respiratory: Negative for cough, chest tightness and shortness of breath.   Cardiovascular: Negative for chest pain, palpitations and leg swelling.  Gastrointestinal: Negative for nausea, vomiting, abdominal pain, diarrhea, constipation, blood in stool, abdominal distention and rectal pain.  Genitourinary: Negative for dysuria, urgency, frequency, hematuria, flank pain, vaginal bleeding, vaginal discharge, difficulty urinating, genital sores, vaginal pain, menstrual problem and pelvic pain.  Musculoskeletal: Positive for back pain and gait problem. Negative for arthralgias.  Skin: Negative for rash.  Neurological: Negative for dizziness, syncope, speech difficulty, weakness, light-headedness, numbness and headaches.  Hematological: Negative for adenopathy. Does not bruise/bleed easily.  Psychiatric/Behavioral: Negative for suicidal ideas, behavioral problems, self-injury, dysphoric  mood and agitation. The patient is not  nervous/anxious.        Objective:   Physical Exam  Constitutional: She is oriented to person, place, and time. She appears well-developed and well-nourished.  HENT:  Head: Normocephalic and atraumatic.  Right Ear: External ear normal.  Left Ear: External ear normal.  Mouth/Throat: Oropharynx is clear and moist.  Eyes: Conjunctivae and EOM are normal.  Neck: Normal range of motion. Neck supple. No JVD present. No thyromegaly present.  Cardiovascular: Normal rate, regular rhythm and normal heart sounds.   No murmur heard.      Has a right dorsalis pedis pulse  Pulmonary/Chest: Effort normal and breath sounds normal. She has no wheezes. She has no rales.  Abdominal: Soft. Bowel sounds are normal. She exhibits no distension and no mass. There is no tenderness. There is no rebound and no guarding.  Genitourinary: Vagina normal.  Musculoskeletal: Normal range of motion. She exhibits edema. She exhibits no tenderness.       Trace lower extremity edema  Neurological: She is alert and oriented to person, place, and time. She has normal reflexes. No cranial nerve deficit. She exhibits normal muscle tone. Coordination normal.  Skin: Skin is warm and dry. No rash noted.  Psychiatric: She has a normal mood and affect. Her behavior is normal.          Assessment & Plan:   Preventive health examination Hypertension stable Exogenous obesity. Weight loss encouraged Dyslipidemia. We'll check a lipid panel continue Crestor Osteoarthritis with chronic low back pain   Recheck 6 months

## 2011-08-13 ENCOUNTER — Other Ambulatory Visit: Payer: Self-pay | Admitting: Internal Medicine

## 2011-08-13 DIAGNOSIS — Z1231 Encounter for screening mammogram for malignant neoplasm of breast: Secondary | ICD-10-CM

## 2011-08-23 DIAGNOSIS — B351 Tinea unguium: Secondary | ICD-10-CM | POA: Diagnosis not present

## 2011-08-23 DIAGNOSIS — M79609 Pain in unspecified limb: Secondary | ICD-10-CM | POA: Diagnosis not present

## 2011-09-14 ENCOUNTER — Other Ambulatory Visit: Payer: Self-pay | Admitting: Internal Medicine

## 2011-09-27 ENCOUNTER — Ambulatory Visit
Admission: RE | Admit: 2011-09-27 | Discharge: 2011-09-27 | Disposition: A | Discharge status: Home / Self Care | Payer: Medicare Other | Source: Ambulatory Visit | Attending: Internal Medicine | Admitting: Internal Medicine

## 2011-09-27 DIAGNOSIS — Z1231 Encounter for screening mammogram for malignant neoplasm of breast: Secondary | ICD-10-CM | POA: Diagnosis not present

## 2011-10-10 DIAGNOSIS — L84 Corns and callosities: Secondary | ICD-10-CM | POA: Diagnosis not present

## 2011-10-23 DIAGNOSIS — H3530 Unspecified macular degeneration: Secondary | ICD-10-CM | POA: Diagnosis not present

## 2011-10-23 DIAGNOSIS — Z961 Presence of intraocular lens: Secondary | ICD-10-CM | POA: Diagnosis not present

## 2011-11-02 ENCOUNTER — Emergency Department (HOSPITAL_COMMUNITY): Payer: Medicare Other

## 2011-11-02 ENCOUNTER — Encounter (HOSPITAL_COMMUNITY): Payer: Self-pay | Admitting: Emergency Medicine

## 2011-11-02 ENCOUNTER — Observation Stay (HOSPITAL_COMMUNITY)
Admission: EM | Admit: 2011-11-02 | Discharge: 2011-11-03 | Disposition: A | Discharge status: Home / Self Care | Payer: Medicare Other | Attending: Internal Medicine | Admitting: Internal Medicine

## 2011-11-02 DIAGNOSIS — M1990 Unspecified osteoarthritis, unspecified site: Secondary | ICD-10-CM | POA: Diagnosis not present

## 2011-11-02 DIAGNOSIS — I519 Heart disease, unspecified: Secondary | ICD-10-CM | POA: Insufficient documentation

## 2011-11-02 DIAGNOSIS — K219 Gastro-esophageal reflux disease without esophagitis: Secondary | ICD-10-CM | POA: Diagnosis not present

## 2011-11-02 DIAGNOSIS — E785 Hyperlipidemia, unspecified: Secondary | ICD-10-CM | POA: Diagnosis not present

## 2011-11-02 DIAGNOSIS — B009 Herpesviral infection, unspecified: Secondary | ICD-10-CM | POA: Diagnosis present

## 2011-11-02 DIAGNOSIS — I10 Essential (primary) hypertension: Secondary | ICD-10-CM | POA: Diagnosis not present

## 2011-11-02 DIAGNOSIS — R0789 Other chest pain: Principal | ICD-10-CM | POA: Insufficient documentation

## 2011-11-02 DIAGNOSIS — R05 Cough: Secondary | ICD-10-CM | POA: Diagnosis not present

## 2011-11-02 DIAGNOSIS — R079 Chest pain, unspecified: Secondary | ICD-10-CM | POA: Diagnosis not present

## 2011-11-02 DIAGNOSIS — Z87891 Personal history of nicotine dependence: Secondary | ICD-10-CM | POA: Diagnosis not present

## 2011-11-02 DIAGNOSIS — Z8679 Personal history of other diseases of the circulatory system: Secondary | ICD-10-CM

## 2011-11-02 DIAGNOSIS — R071 Chest pain on breathing: Secondary | ICD-10-CM | POA: Diagnosis not present

## 2011-11-02 HISTORY — DX: Gastro-esophageal reflux disease without esophagitis: K21.9

## 2011-11-02 HISTORY — DX: Unspecified osteoarthritis, unspecified site: M19.90

## 2011-11-02 HISTORY — DX: Essential (primary) hypertension: I10

## 2011-11-02 LAB — COMPREHENSIVE METABOLIC PANEL
ALT: 21 U/L (ref 0–35)
Alkaline Phosphatase: 97 U/L (ref 39–117)
BUN: 21 mg/dL (ref 6–23)
CO2: 31 mEq/L (ref 19–32)
GFR calc Af Amer: 62 mL/min — ABNORMAL LOW (ref >90)
GFR calc non Af Amer: 54 mL/min — ABNORMAL LOW (ref >90)
Glucose, Bld: 92 mg/dL (ref 70–99)
Potassium: 3.7 mEq/L (ref 3.5–5.1)
Sodium: 141 mEq/L (ref 135–145)
Total Protein: 6.8 g/dL (ref 6.0–8.3)

## 2011-11-02 LAB — PROTIME-INR: Prothrombin Time: 12.2 seconds (ref 11.6–15.2)

## 2011-11-02 LAB — POCT I-STAT TROPONIN I: Troponin i, poc: 0 ng/mL (ref 0.00–0.08)

## 2011-11-02 LAB — CBC
HCT: 47 % — ABNORMAL HIGH (ref 36.0–46.0)
Hemoglobin: 15.7 g/dL — ABNORMAL HIGH (ref 12.0–15.0)
MCH: 32.8 pg (ref 26.0–34.0)
MCHC: 33.4 g/dL (ref 30.0–36.0)
RBC: 4.78 MIL/uL (ref 3.87–5.11)

## 2011-11-02 LAB — URINALYSIS, ROUTINE W REFLEX MICROSCOPIC
Bilirubin Urine: NEGATIVE
Glucose, UA: NEGATIVE mg/dL
Hgb urine dipstick: NEGATIVE
Ketones, ur: NEGATIVE mg/dL
Protein, ur: 100 mg/dL — AB
pH: 6.5 (ref 5.0–8.0)

## 2011-11-02 LAB — CARDIAC PANEL(CRET KIN+CKTOT+MB+TROPI): Troponin I: <0.3 ng/mL (ref <0.30)

## 2011-11-02 LAB — URINE MICROSCOPIC-ADD ON

## 2011-11-02 MED ORDER — HYPROMELLOSE (GONIOSCOPIC) 2.5 % OP SOLN
1.0000 [drp] | Freq: Every day | OPHTHALMIC | Status: DC | PRN
  Filled 2011-11-02: qty 15

## 2011-11-02 MED ORDER — PANTOPRAZOLE SODIUM 40 MG PO TBEC
40.0000 mg | DELAYED_RELEASE_TABLET | Freq: Every day | ORAL | Status: DC
  Administered 2011-11-02: 40 mg via ORAL
  Filled 2011-11-02: qty 1

## 2011-11-02 MED ORDER — OMEGA-3-ACID ETHYL ESTERS 1 G PO CAPS
1.0000 g | ORAL_CAPSULE | Freq: Every day | ORAL | Status: DC
  Filled 2011-11-02 (×2): qty 1

## 2011-11-02 MED ORDER — SODIUM CHLORIDE 0.9 % IV SOLN: 250.0000 mL | INTRAVENOUS | Status: DC | PRN

## 2011-11-02 MED ORDER — OMEGA-3 FATTY ACIDS 1000 MG PO CAPS: 1.0000 g | ORAL_CAPSULE | Freq: Every day | ORAL | Status: DC

## 2011-11-02 MED ORDER — CALCIUM CARBONATE-VITAMIN D 500-200 MG-UNIT PO TABS
1.0000 | ORAL_TABLET | Freq: Every day | ORAL | Status: DC
  Filled 2011-11-02 (×2): qty 1

## 2011-11-02 MED ORDER — NITROGLYCERIN 0.4 MG SL SUBL: 0.4000 mg | SUBLINGUAL_TABLET | SUBLINGUAL | Status: DC | PRN

## 2011-11-02 MED ORDER — ATORVASTATIN CALCIUM 10 MG PO TABS: 10.0000 mg | ORAL_TABLET | Freq: Every day | ORAL | Status: DC

## 2011-11-02 MED ORDER — HYDROCODONE-ACETAMINOPHEN 5-500 MG PO TABS: 1.5000 | ORAL_TABLET | Freq: Four times a day (QID) | ORAL | Status: DC | PRN

## 2011-11-02 MED ORDER — OCUVITE PO TABS
1.0000 | ORAL_TABLET | Freq: Every day | ORAL | Status: DC
  Filled 2011-11-02 (×2): qty 1

## 2011-11-02 MED ORDER — POLYVINYL ALCOHOL 1.4 % OP SOLN
1.0000 [drp] | Freq: Every day | OPHTHALMIC | Status: DC | PRN
  Filled 2011-11-02: qty 15

## 2011-11-02 MED ORDER — SODIUM CHLORIDE 0.9 % IV SOLN
1000.0000 mL | INTRAVENOUS | Status: DC
  Administered 2011-11-02: 1000 mL via INTRAVENOUS

## 2011-11-02 MED ORDER — SODIUM CHLORIDE 0.9 % IJ SOLN: 3.0000 mL | Freq: Two times a day (BID) | INTRAMUSCULAR | Status: DC

## 2011-11-02 MED ORDER — SODIUM CHLORIDE 0.9 % IV SOLN: INTRAVENOUS | Status: DC

## 2011-11-02 MED ORDER — HYDROCHLOROTHIAZIDE 25 MG PO TABS
25.0000 mg | ORAL_TABLET | Freq: Every day | ORAL | Status: DC
  Filled 2011-11-02 (×2): qty 1

## 2011-11-02 MED ORDER — SODIUM CHLORIDE 0.9 % IJ SOLN
3.0000 mL | Freq: Two times a day (BID) | INTRAMUSCULAR | Status: DC
  Administered 2011-11-02: 3 mL via INTRAVENOUS

## 2011-11-02 MED ORDER — MULTIVITAMINS PO CAPS: 1.0000 | ORAL_CAPSULE | Freq: Every day | ORAL | Status: DC

## 2011-11-02 MED ORDER — ADULT MULTIVITAMIN W/MINERALS CH
1.0000 | ORAL_TABLET | Freq: Every day | ORAL | Status: DC
  Filled 2011-11-02 (×2): qty 1

## 2011-11-02 MED ORDER — ASPIRIN EC 81 MG PO TBEC
81.0000 mg | DELAYED_RELEASE_TABLET | Freq: Every day | ORAL | Status: DC
  Filled 2011-11-02: qty 1

## 2011-11-02 MED ORDER — ACETAMINOPHEN 500 MG PO TABS
500.0000 mg | ORAL_TABLET | Freq: Every evening | ORAL | Status: DC | PRN
  Administered 2011-11-02: 500 mg via ORAL
  Filled 2011-11-02: qty 1

## 2011-11-02 MED ORDER — SODIUM CHLORIDE 0.9 % IJ SOLN: 3.0000 mL | INTRAMUSCULAR | Status: DC | PRN

## 2011-11-02 MED ORDER — HYDROCODONE-ACETAMINOPHEN 7.5-325 MG PO TABS: 1.0000 | ORAL_TABLET | Freq: Four times a day (QID) | ORAL | Status: DC | PRN

## 2011-11-02 MED ORDER — ATORVASTATIN CALCIUM 10 MG PO TABS
10.0000 mg | ORAL_TABLET | Freq: Every day | ORAL | Status: DC
  Filled 2011-11-02 (×2): qty 1

## 2011-11-02 NOTE — ED Provider Notes (Signed)
History     CSN: [id number]  Arrival date & time 11/02/11  1158   First MD Initiated Contact with Patient 11/02/11 1159      Chief Complaint  Patient presents with  . Chest Pain    (Consider location/radiation/quality/duration/timing/severity/associated sxs/prior treatment) HPI Comments: Patient is an 78 year old woman who felt a tightness in her chest yesterday after breakfast. She also had pain in the right anterior chest when she coughed. Today she is a little better but still remains sore, and has tightness in her chest. Her daughter is a Software engineer, who advised her to go to the urgent care Center to have an EKG. However apparently they don't have that capability at the [facility] primary care clinic on a Saturday, and so she was sent to [facility] for evaluation.  Patient is a 78 y.o. female presenting with chest pain.  Chest Pain The chest pain began yesterday. Chest pain occurs intermittently (She has had intermittent chest pain and pressure since yesterday, and had some right anterior pleuritic chest pain when she would cough.). The pain is associated with coughing.     Past Medical History  Diagnosis Date  . Hypertension   . GERD (gastroesophageal reflux disease)   . Arthritis     Past Surgical History  Procedure Date  . Cholecystectomy   . Abdominal hysterectomy   . Joint replacement   . Tonsillectomy     No family history on file.  History  Substance Use Topics  . Smoking status: Former Smoker    Quit date: 07/08/1978  . Smokeless tobacco: Not on file  . Alcohol Use: 0.6 oz/week    1 Glasses of wine per week     once or twice a month    OB History    Grav Para Term Preterm Abortions TAB SAB Ect Mult Living                  Review of Systems  Cardiovascular: Positive for chest pain.    Allergies  Penicillins and Piroxicam  Home Medications   Current Outpatient Rx  Name Route Sig Dispense Refill  . ASPIRIN 81 MG PO TBEC Oral Take 81 mg by  mouth daily.      Marland Kitchen CALCIUM CARBONATE-VITAMIN D 500-200 MG-UNIT PO TABS Oral Take 1 tablet by mouth daily.      . OMEGA-3 FATTY ACIDS 1000 MG PO CAPS Oral Take 1 g by mouth daily.      Marland Kitchen HYDROCHLOROTHIAZIDE 25 MG PO TABS Oral Take 1 tablet (25 mg total) by mouth daily. 90 tablet 5  . HYDROCHLOROTHIAZIDE 25 MG PO TABS  TAKE ONE TABLET BY MOUTH ONE TIME DAILY 90 tablet 2  . HYDROCODONE-ACETAMINOPHEN 7.5-750 MG PO TABS Oral Take 0.5 tablets by mouth 3 (three) times daily as needed. 50 tablet 2  . MECLIZINE HCL 32 MG PO TABS Oral Take 32 mg by mouth as needed.      . MULTIVITAMINS PO CAPS Oral Take 1 capsule by mouth daily.      Marland Kitchen MUPIROCIN 2 % EX OINT Topical Apply topically 2 (two) times daily as needed.      . NEOMYCIN-POLYMYXIN-HC 3.5-10000-1 OP SUSP Ophthalmic Apply 2 drops to eye 4 (four) times daily.      Marland Kitchen OMEPRAZOLE 20 MG PO CPDR Oral Take 1 capsule (20 mg total) by mouth 2 (two) times daily. 180 capsule 2  . ROSUVASTATIN CALCIUM 10 MG PO TABS Oral Take 1 tablet (10 mg total) by mouth  daily. 90 tablet 6  . TRAMADOL HCL 50 MG PO TABS Oral Take 1 tablet (50 mg total) by mouth every 6 (six) hours as needed. 60 tablet 3  . TRAZODONE HCL 50 MG PO TABS Oral Take 1 tablet (50 mg total) by mouth at bedtime. 90 tablet 4    BP 182/84  Temp(Src) 97.7 F (36.5 C) (Oral)  Resp 18  SpO2 97%  Physical Exam  Nursing note and vitals reviewed. Constitutional: She is oriented to person, place, and time. She appears well-developed and well-nourished. No distress.       Pleasant elderly lady, no distress at rest.  HENT:  Head: Normocephalic and atraumatic.  Right Ear: External ear normal.  Left Ear: External ear normal.  Mouth/Throat: Oropharynx is clear and moist.  Eyes: Conjunctivae and EOM are normal. Pupils are equal, round, and reactive to light. No scleral icterus.  Neck: Normal range of motion. Neck supple.  Cardiovascular: Normal rate, regular rhythm and normal heart sounds.     Pulmonary/Chest: Effort normal and breath sounds normal. She exhibits no tenderness.  Abdominal: Soft. Bowel sounds are normal.  Musculoskeletal: Normal range of motion. She exhibits no edema and no tenderness.  Neurological: She is alert and oriented to person, place, and time.       No sensory or motor deficit.  Skin: Skin is warm and dry.  Psychiatric: She has a normal mood and affect. Her behavior is normal.    ED Course  Procedures (including critical care time)  12:18 PM  Date: 11/02/2011  Rate: 68  Rhythm: normal sinus rhythm  QRS Axis: normal  Intervals: normal QRS:  Poor R wave progression in precordial leads suggests old anterior myocardial infarction.    ST/T Wave abnormalities: normal  Conduction Disutrbances:none  Narrative Interpretation: Abnormal EKG  Old EKG Reviewed: none available  3:40 PM Results for orders placed during the hospital encounter of 11/02/11  CBC      Component Value Range   WBC 5.2  4.0 - 10.5 (K/uL)   RBC 4.78  3.87 - 5.11 (MIL/uL)   Hemoglobin 15.7 (*) 12.0 - 15.0 (g/dL)   HCT 47.0 (*) 36.0 - 46.0 (%)   MCV 98.3  78.0 - 100.0 (fL)   MCH 32.8  26.0 - 34.0 (pg)   MCHC 33.4  30.0 - 36.0 (g/dL)   RDW 12.5  11.5 - 15.5 (%)   Platelets 174  150 - 400 (K/uL)  COMPREHENSIVE METABOLIC PANEL      Component Value Range   Sodium 141  135 - 145 (mEq/L)   Potassium 3.7  3.5 - 5.1 (mEq/L)   Chloride 102  96 - 112 (mEq/L)   CO2 31  19 - 32 (mEq/L)   Glucose, Bld 92  70 - 99 (mg/dL)   BUN 21  6 - 23 (mg/dL)   Creatinine, Ser 0.96  0.50 - 1.10 (mg/dL)   Calcium 9.4  8.4 - 10.5 (mg/dL)   Total Protein 6.8  6.0 - 8.3 (g/dL)   Albumin 3.6  3.5 - 5.2 (g/dL)   AST 23  0 - 37 (U/L)   ALT 21  0 - 35 (U/L)   Alkaline Phosphatase 97  39 - 117 (U/L)   Total Bilirubin 0.6  0.3 - 1.2 (mg/dL)   GFR calc non Af Amer 54 (*) >90 (mL/min)   GFR calc Af Amer 62 (*) >90 (mL/min)  PROTIME-INR      Component Value Range   Prothrombin Time 12.2  11.6 -  15.2  (seconds)   INR 0.89  0.00 - 1.49   APTT      Component Value Range   aPTT 31  24 - 37 (seconds)  URINALYSIS, ROUTINE W REFLEX MICROSCOPIC      Component Value Range   Color, Urine YELLOW  YELLOW    APPearance CLOUDY (*) CLEAR    Specific Gravity, Urine 1.017  1.005 - 1.030    pH 6.5  5.0 - 8.0    Glucose, UA NEGATIVE  NEGATIVE (mg/dL)   Hgb urine dipstick NEGATIVE  NEGATIVE    Bilirubin Urine NEGATIVE  NEGATIVE    Ketones, ur NEGATIVE  NEGATIVE (mg/dL)   Protein, ur 100 (*) NEGATIVE (mg/dL)   Urobilinogen, UA 1.0  0.0 - 1.0 (mg/dL)   Nitrite NEGATIVE  NEGATIVE    Leukocytes, UA SMALL (*) NEGATIVE   POCT I-STAT TROPONIN I      Component Value Range   Troponin i, poc 0.00  0.00 - 0.08 (ng/mL)   Comment 3           URINE MICROSCOPIC-ADD ON      Component Value Range   Squamous Epithelial / LPF RARE  RARE    WBC, UA 3-6  <3 (WBC/hpf)   RBC / HPF 0-2  <3 (RBC/hpf)   Dg Chest Portable 1 View  11/02/2011  *RADIOLOGY REPORT*  Clinical Data: Chest pain and cough.  PORTABLE CHEST - 1 VIEW  Comparison: 11/22/2010  Findings: No edema, infiltrate or pleural fluid identified.  Stable heart size.  IMPRESSION: No active disease.  Original Report Authenticated By: Azzie Roup, M.D.    Lab tests including TNI all negative for MI.  Advised pt that she should be admitted for chest pain observation.    4:11 PM.  Case discussed with Dr. Clementeen Graham.   Admit to Observation status to a telemetry bed to [facility], Team 9, Dr. Clementeen Graham.  1. Chest pain            Mylinda Latina III, MD 11/02/11 [place], MD 12/17/11 [phone]

## 2011-11-02 NOTE — H&P (Addendum)
PCP:  Nyoka Cowden, MD, MD   DOA:  11/02/2011 11:58 AM  Chief Complaint:  Chest tightness x 1 day  HPI: 78 y/o female with hx of HTN, OA, remote smoking hx was in her usual state of health when she experienced chest tightness mainly involving rt substernal area yesterday morning while making breakfast. The pain as 7/10 , non radiating and lasted for 15 minutes. Patient called her daughter who is a Software engineer and was advised to got to the [facility]. She went to the urgent care today and  was sent to ED. patient denies any SOB, orthopnea or PND. denies fever chills, palpitations, nausea or vomiting. Has hx if acid reflux but symptoms are different from this.  informs pain to be worse with cough. Denies similar symptoms in past although informs having pain over left arm few years ago and being seen by Dr Harrington Challenger and had a stress test done which she does not remember.  Patient did not have any symptoms on my evaluation.  Denies any weight loss, bowel or urinary symptoms.  Allergies: Allergies  Allergen Reactions  . Lisinopril Cough  . Piroxicam Other (See Comments)    unknown  . Prednisone Other (See Comments)    hypertensive  . Penicillins Rash    Prior to Admission medications   Medication Sig Start Date End Date Taking? Authorizing Provider  acetaminophen (TYLENOL) 500 MG tablet Take 500 mg by mouth See admin instructions. Take one  in AM and one at qhs, and one at noon if needed   Yes Historical Provider, MD  aspirin 81 MG EC tablet Take 81 mg by mouth daily.    Yes Historical Provider, MD  beta carotene w/minerals (OCUVITE) tablet Take 1 tablet by mouth daily.   Yes Historical Provider, MD  calcium-vitamin D (OSCAL WITH D 500-200) 500-200 MG-UNIT per tablet Take 1 tablet by mouth daily.    Yes Historical Provider, MD  diphenhydramine-acetaminophen (TYLENOL PM) 25-500 MG TABS Take 1 tablet by mouth at bedtime as needed. For sleep   Yes Historical Provider, MD  fish  oil-omega-3 fatty acids 1000 MG capsule Take 1 g by mouth daily.     Yes Historical Provider, MD  hydrochlorothiazide (HYDRODIURIL) 25 MG tablet Take 25 mg by mouth daily.   Yes Historical Provider, MD  HYDROcodone-acetaminophen (VICODIN ES) 7.5-750 MG per tablet Take 0.5 tablets by mouth 3 (three) times daily as needed. For pain   Yes Historical Provider, MD  hydroxypropyl methylcellulose (ISOPTO TEARS) 2.5 % ophthalmic solution Place 1 drop into both eyes daily as needed.   Yes Historical Provider, MD  Multiple Vitamin (MULTIVITAMIN) capsule Take 1 capsule by mouth daily.    Yes Historical Provider, MD  mupirocin (BACTROBAN) 2 % ointment Apply 1 application topically 2 (two) times daily as needed.    Yes Historical Provider, MD  omeprazole (PRILOSEC) 20 MG capsule Take 20 mg by mouth 2 (two) times daily.   Yes Historical Provider, MD  rosuvastatin (CRESTOR) 5 MG tablet Take 5 mg by mouth at bedtime.   Yes Historical Provider, MD    Past Medical History  Diagnosis Date  . Hypertension   . GERD (gastroesophageal reflux disease)   . Arthritis     Past Surgical History  Procedure Date  . Cholecystectomy   . Abdominal hysterectomy   . Joint replacement   . Tonsillectomy     Social History:  reports that she quit smoking about 33 years ago. She does not have any smokeless  tobacco history on file. She reports that she drinks about .6 ounces of alcohol per week. Her drug history not on file.  No family history on file.  Review of Systems:  Constitutional: Denies fever, chills, diaphoresis, appetite change and fatigue.  HEENT: Denies photophobia, eye pain, redness, hearing loss, ear pain, congestion, sore throat, rhinorrhea, sneezing, mouth sores, trouble swallowing, neck pain, neck stiffness and tinnitus.   Respiratory: Denies SOB, DOE, cough, chest tightness,  and wheezing.   Cardiovascular:  chest tightness , Denies palpitations and leg swelling.  Gastrointestinal: Denies nausea,  vomiting, abdominal pain, diarrhea, constipation, blood in stool and abdominal distention.  Genitourinary: Denies dysuria, urgency, frequency, hematuria, flank pain and difficulty urinating.  Musculoskeletal: Denies myalgias, back pain, joint swelling, arthralgias and gait problem.  Skin: Denies pallor, rash and wound.  Neurological: Denies dizziness, seizures, syncope, weakness, light-headedness, numbness and headaches.  Hematological: Denies adenopathy. Easy bruising, personal or family bleeding history  Psychiatric/Behavioral: Denies suicidal ideation, mood changes, confusion, nervousness, sleep disturbance and agitation   Physical Exam:  Filed Vitals:   11/02/11 1329 11/02/11 1435 11/02/11 1538 11/02/11 1623  BP:  159/82 154/66 144/63  Pulse:    68  Temp:      TempSrc:      Resp:  18 20 19   Height: 5' 2.5" (1.588 m)     Weight: 89.812 kg (198 lb)     SpO2:  99% 100% 100%    Constitutional: Vital signs reviewed.  Patient is a well-developed and well-nourished in no acute distress and cooperative with exam. Alert and oriented x3.  Head: Normocephalic and atraumatic Ear: TM normal bilaterally Mouth: no erythema or exudates, MMM Eyes: PERRL, EOMI, conjunctivae normal, No scleral icterus.  Neck: Supple, Trachea midline normal ROM, No JVD, mass, thyromegaly, or carotid bruit present.  Cardiovascular: RRR, S1 normal, S2 normal, no MRG, pulses symmetric and intact bilaterally, reproducible tenderness on palpation over rt parasternal area Pulmonary/Chest: CTAB, no wheezes, rales, or rhonchi Abdominal: Soft. Non-tender, non-distended, bowel sounds are normal, no masses, organomegaly, or guarding present.  GU: no CVA tenderness Musculoskeletal: No joint deformities, erythema, or stiffness, ROM full and no nontender Ext: no edema and no cyanosis, pulses palpable bilaterally (DP and PT) Hematology: no cervical, inginal, or axillary adenopathy.  Neurological: A&O x3, Strenght is normal and  symmetric bilaterally, cranial nerve II-XII are grossly intact, no focal motor deficit, sensory intact to light touch bilaterally.  Skin: Warm, dry and intact. No rash, cyanosis, or clubbing.  Psychiatric: Normal mood and affect. speech and behavior is normal. Judgment and thought content normal. Cognition and memory are normal.   Labs on Admission:  Results for orders placed during the hospital encounter of 11/02/11 (from the past 48 hour(s))  CBC     Status: Abnormal   Collection Time   11/02/11 12:57 PM      Component Value Range Comment   WBC 5.2  4.0 - 10.5 (K/uL)    RBC 4.78  3.87 - 5.11 (MIL/uL)    Hemoglobin 15.7 (*) 12.0 - 15.0 (g/dL)    HCT 47.0 (*) 36.0 - 46.0 (%)    MCV 98.3  78.0 - 100.0 (fL)    MCH 32.8  26.0 - 34.0 (pg)    MCHC 33.4  30.0 - 36.0 (g/dL)    RDW 12.5  11.5 - 15.5 (%)    Platelets 174  150 - 400 (K/uL)   COMPREHENSIVE METABOLIC PANEL     Status: Abnormal   Collection Time  11/02/11 12:57 PM      Component Value Range Comment   Sodium 141  135 - 145 (mEq/L)    Potassium 3.7  3.5 - 5.1 (mEq/L)    Chloride 102  96 - 112 (mEq/L)    CO2 31  19 - 32 (mEq/L)    Glucose, Bld 92  70 - 99 (mg/dL)    BUN 21  6 - 23 (mg/dL)    Creatinine, Ser 0.96  0.50 - 1.10 (mg/dL)    Calcium 9.4  8.4 - 10.5 (mg/dL)    Total Protein 6.8  6.0 - 8.3 (g/dL)    Albumin 3.6  3.5 - 5.2 (g/dL)    AST 23  0 - 37 (U/L)    ALT 21  0 - 35 (U/L)    Alkaline Phosphatase 97  39 - 117 (U/L)    Total Bilirubin 0.6  0.3 - 1.2 (mg/dL)    GFR calc non Af Amer 54 (*) >90 (mL/min)    GFR calc Af Amer 62 (*) >90 (mL/min)   PROTIME-INR     Status: Normal   Collection Time   11/02/11 12:57 PM      Component Value Range Comment   Prothrombin Time 12.2  11.6 - 15.2 (seconds)    INR 0.89  0.00 - 1.49    APTT     Status: Normal   Collection Time   11/02/11 12:57 PM      Component Value Range Comment   aPTT 31  24 - 37 (seconds)   POCT I-STAT TROPONIN I     Status: Normal   Collection Time    11/02/11  1:12 PM      Component Value Range Comment   Troponin i, poc 0.00  0.00 - 0.08 (ng/mL)    Comment 3            URINALYSIS, ROUTINE W REFLEX MICROSCOPIC     Status: Abnormal   Collection Time   11/02/11  1:29 PM      Component Value Range Comment   Color, Urine YELLOW  YELLOW     APPearance CLOUDY (*) CLEAR     Specific Gravity, Urine 1.017  1.005 - 1.030     pH 6.5  5.0 - 8.0     Glucose, UA NEGATIVE  NEGATIVE (mg/dL)    Hgb urine dipstick NEGATIVE  NEGATIVE     Bilirubin Urine NEGATIVE  NEGATIVE     Ketones, ur NEGATIVE  NEGATIVE (mg/dL)    Protein, ur 100 (*) NEGATIVE (mg/dL)    Urobilinogen, UA 1.0  0.0 - 1.0 (mg/dL)    Nitrite NEGATIVE  NEGATIVE     Leukocytes, UA SMALL (*) NEGATIVE    URINE MICROSCOPIC-ADD ON     Status: Normal   Collection Time   11/02/11  1:29 PM      Component Value Range Comment   Squamous Epithelial / LPF RARE  RARE     WBC, UA 3-6  <3 (WBC/hpf)    RBC / HPF 0-2  <3 (RBC/hpf)   POCT I-STAT TROPONIN I     Status: Normal   Collection Time   11/02/11  4:40 PM      Component Value Range Comment   Troponin i, poc 0.00  0.00 - 0.08 (ng/mL)    Comment 3              Radiological Exams on Admission: CXR: NAI  EKG: NSR no S1 &S2, No ST-T changes  Assessment/Plan 78  y/o female with hx of HTN, acid reflux and hx of smoking presented with chest tightness of 1 day duration.   Chest pain:  appears atypical , likely musculoskeletal Admit under obs to tele Rule out ACS with serial CE and EKG Cont ASA Check TSH, lipid panel in am Will hold off on stress test and probably can be worked up as outpatient.  HYPERTENSION Cont home meds   Osteoarthritis cont pain meds   DVT prophylaxis  early ambulation   Cardiac diet  Full code  Time Spent on Admission: 50 minutes  Mylin Gignac 11/02/2011, 5:38 PM

## 2011-11-03 DIAGNOSIS — R071 Chest pain on breathing: Secondary | ICD-10-CM | POA: Diagnosis not present

## 2011-11-03 DIAGNOSIS — I10 Essential (primary) hypertension: Secondary | ICD-10-CM | POA: Diagnosis not present

## 2011-11-03 DIAGNOSIS — Z87891 Personal history of nicotine dependence: Secondary | ICD-10-CM | POA: Diagnosis not present

## 2011-11-03 DIAGNOSIS — R079 Chest pain, unspecified: Secondary | ICD-10-CM | POA: Diagnosis not present

## 2011-11-03 DIAGNOSIS — Z8679 Personal history of other diseases of the circulatory system: Secondary | ICD-10-CM

## 2011-11-03 DIAGNOSIS — R0789 Other chest pain: Secondary | ICD-10-CM | POA: Diagnosis present

## 2011-11-03 LAB — CARDIAC PANEL(CRET KIN+CKTOT+MB+TROPI)
CK, MB: 3.2 ng/mL (ref 0.3–4.0)
Total CK: 136 U/L (ref 7–177)
Troponin I: <0.3 ng/mL (ref <0.30)

## 2011-11-03 MED ORDER — ACETAMINOPHEN 500 MG PO TABS: 500.0000 mg | ORAL_TABLET | Freq: Four times a day (QID) | ORAL | Status: AC | PRN

## 2011-11-03 NOTE — Discharge Summary (Signed)
Patient ID: Shelley Robinson MRN: [id number] DOB/AGE: 11-11-1926 57 y.o.  Admit date: 11/02/2011 Discharge date: 11/03/2011  Primary Care Physician:  Nyoka Cowden, MD, MD  Discharge Diagnoses:     Principal Problem:  *Chest pain, atypical  Active Problems:  HYPERTENSION  OSTEOARTHRITIS Dyslipidemia Grade 1 diastolic dysfunction ( 2D echo from 2012)   Medication List  As of 11/03/2011  8:44 AM   STOP taking these medications         HYDROcodone-acetaminophen 7.5-750 MG per tablet         TAKE these medications         acetaminophen 500 MG tablet   Commonly known as: TYLENOL   Take 1 tablet (500 mg total) by mouth every 6 (six) hours as needed for pain. Take one  in AM and one at qhs, and one at noon if needed      aspirin 81 MG EC tablet   Take 81 mg by mouth daily.      beta carotene w/minerals tablet   Take 1 tablet by mouth daily.      calcium-vitamin D 500-200 MG-UNIT per tablet   Commonly known as: OSCAL WITH D   Take 1 tablet by mouth daily.      diphenhydramine-acetaminophen 25-500 MG Tabs   Commonly known as: TYLENOL PM   Take 1 tablet by mouth at bedtime as needed. For sleep      fish oil-omega-3 fatty acids 1000 MG capsule   Take 1 g by mouth daily.      hydrochlorothiazide 25 MG tablet   Commonly known as: HYDRODIURIL   Take 25 mg by mouth daily.      hydroxypropyl methylcellulose 2.5 % ophthalmic solution   Commonly known as: ISOPTO TEARS   Place 1 drop into both eyes daily as needed.      multivitamin capsule   Take 1 capsule by mouth daily.      mupirocin ointment 2 %   Commonly known as: BACTROBAN   Apply 1 application topically 2 (two) times daily as needed.      omeprazole 20 MG capsule   Commonly known as: PRILOSEC   Take 20 mg by mouth 2 (two) times daily.      rosuvastatin 5 MG tablet   Commonly known as: CRESTOR   Take 5 mg by mouth at bedtime.            Disposition and Follow-up:  Home with outpt PCP  follow up  Consults:  none  Significant Diagnostic Studies:  No results found.  Brief H and P: For complete details please refer to admission H and P, but in brief 78 y/o female with hx of HTN, OA, remote smoking hx was in her usual state of health when she experienced chest tightness mainly involving rt substernal area yesterday morning while making breakfast. The pain as 7/10 , non radiating and lasted for 15 minutes. Patient called her daughter who is a Software engineer and was advised to got to the [facility]. She went to the urgent care today and was sent to ED. patient denies any SOB, orthopnea or PND. denies fever chills, palpitations, nausea or vomiting. Has hx if acid reflux but symptoms are different from this. informs pain to be worse with cough. Denies similar symptoms in past although informs having pain over left arm few years ago and being seen by Dr Harrington Challenger and had a stress test done which she does not remember.    Physical Exam on  Discharge:  Filed Vitals:   11/02/11 1623 11/02/11 1730 11/02/11 2100 11/03/11 0600  BP: 144/63 122/67 139/71 126/63  Pulse: 68 79 74 65  Temp:  97.6 F (36.4 C) 97.8 F (36.6 C) 97.8 F (36.6 C)  TempSrc:  Oral    Resp: 19 20 16 16   Height:      Weight:  90.2 kg (198 lb 13.7 oz)    SpO2: 100% 94% 93% 93%    No intake or output data in the 24 hours ending 11/03/11 0844  General: Alert, awake, oriented x3, in no acute distress. HEENT: No bruits, no goiter. Heart: Regular rate and rhythm, without murmurs, rubs, gallops.reproducible pain on palpation over rt parasternal area Lungs: Clear to auscultation bilaterally. Abdomen: Soft, nontender, nondistended, positive bowel sounds. Extremities: No clubbing cyanosis or edema with positive pedal pulses. Neuro: Grossly intact, nonfocal.  CBC:    Component Value Date/Time   WBC 5.2 11/02/2011 1257   HGB 15.7* 11/02/2011 1257   HCT 47.0* 11/02/2011 1257   PLT 174 11/02/2011 1257   MCV 98.3 11/02/2011  1257   NEUTROABS 2.7 06/17/2011 1009   LYMPHSABS 1.2 06/17/2011 1009   MONOABS 0.4 06/17/2011 1009   EOSABS 0.1 06/17/2011 1009   BASOSABS 0.0 06/17/2011 123XX123    Basic Metabolic Panel:    Component Value Date/Time   NA 141 11/02/2011 1257   K 3.7 11/02/2011 1257   CL 102 11/02/2011 1257   CO2 31 11/02/2011 1257   BUN 21 11/02/2011 1257   CREATININE 0.96 11/02/2011 1257   GLUCOSE 92 11/02/2011 1257   GLUCOSE 85 05/14/2006 0000   CALCIUM 9.4 11/02/2011 1257    Hospital Course:   Atypical chest pain: Patient admitted under observation telemetry She was ruled out for ACS with serial CE and EKG  chest pain was atypical and likely musculoskeletal with reproducible pain to pressure over rt upper parasternal area.  patient stable on tele and clinically improved  she had  Echo done in 2012 which showed a normal EF and grade 1 diastolic dysfunction  she does not need a stress test at this time and can be discharged home with PCP follow up  Time spent on Discharge: 25 minutes  Signed: Louellen Molder 11/03/2011, 8:44 AM

## 2011-11-12 ENCOUNTER — Encounter: Payer: Self-pay | Admitting: Internal Medicine

## 2011-11-12 ENCOUNTER — Ambulatory Visit (INDEPENDENT_AMBULATORY_CARE_PROVIDER_SITE_OTHER): Payer: Medicare Other | Admitting: Internal Medicine

## 2011-11-12 VITALS — BP 120/80 | Temp 97.7°F | Wt 198.0 lb

## 2011-11-12 DIAGNOSIS — E785 Hyperlipidemia, unspecified: Secondary | ICD-10-CM

## 2011-11-12 DIAGNOSIS — M1990 Unspecified osteoarthritis, unspecified site: Secondary | ICD-10-CM

## 2011-11-12 DIAGNOSIS — I10 Essential (primary) hypertension: Secondary | ICD-10-CM | POA: Diagnosis not present

## 2011-11-12 DIAGNOSIS — R0789 Other chest pain: Secondary | ICD-10-CM

## 2011-11-12 NOTE — Progress Notes (Signed)
  Subjective:    Patient ID: Shelley Robinson, female    DOB: [birth year], 78 y.o.   MRN: [id number]  HPI  78 year old patient who has a history of hypertension and dyslipidemia. She was hospitalized briefly to rule out acute coronary syndrome. Since her discharge she has done quite well and has had no recurrent chest pain. She presented with a prolonged episode of chest tightness and an acute MI was ruled out. She was noted on clinical exam to have the right upper anterior chest wall pain to palpation. She also has a history of gastroesophageal reflux disease. She was told that she may have had a prior MI. EKGs have consistently revealed poor R wave progression. An echocardiogram last year revealed normal ejection fraction and no wall motion abnormalities. Complaints today include some right lumbar pain but no cardiopulmonary complaints. Hospital records were reviewed.    Review of Systems  Constitutional: Negative.   HENT: Negative for hearing loss, congestion, sore throat, rhinorrhea, dental problem, sinus pressure and tinnitus.   Eyes: Negative for pain, discharge and visual disturbance.  Respiratory: Negative for cough and shortness of breath.   Cardiovascular: Positive for chest pain. Negative for palpitations and leg swelling.  Gastrointestinal: Negative for nausea, vomiting, abdominal pain, diarrhea, constipation, blood in stool and abdominal distention.  Genitourinary: Negative for dysuria, urgency, frequency, hematuria, flank pain, vaginal bleeding, vaginal discharge, difficulty urinating, vaginal pain and pelvic pain.  Musculoskeletal: Positive for back pain. Negative for joint swelling, arthralgias and gait problem.  Skin: Negative for rash.  Neurological: Negative for dizziness, syncope, speech difficulty, weakness, numbness and headaches.  Hematological: Negative for adenopathy.  Psychiatric/Behavioral: Negative for behavioral problems, dysphoric mood and agitation. The patient is  not nervous/anxious.        Objective:   Physical Exam  Constitutional: She is oriented to person, place, and time. She appears well-developed and well-nourished.  HENT:  Head: Normocephalic.  Right Ear: External ear normal.  Left Ear: External ear normal.  Mouth/Throat: Oropharynx is clear and moist.  Eyes: Conjunctivae and EOM are normal. Pupils are equal, round, and reactive to light.  Neck: Normal range of motion. Neck supple. No thyromegaly present.  Cardiovascular: Normal rate, regular rhythm, normal heart sounds and intact distal pulses.   Pulmonary/Chest: Effort normal and breath sounds normal. She exhibits tenderness.       Persistent tenderness in the right upper anterior chest wall area  Abdominal: Soft. Bowel sounds are normal. She exhibits no mass. There is no tenderness.  Musculoskeletal: Normal range of motion.  Lymphadenopathy:    She has no cervical adenopathy.  Neurological: She is alert and oriented to person, place, and time.  Skin: Skin is warm and dry. No rash noted.  Psychiatric: She has a normal mood and affect. Her behavior is normal.          Assessment & Plan:   Chest pain syndrome. Chest pain has not reoccurred or if she has resumed her usual activities. Serial EKGs and echocardiograms and hospital records were reviewed. Doubt that this  individual has had a prior anterior MI. Hypertension stable Osteoarthritis with throat low back pain Dyslipidemia. Samples of Crestor dispensed  Low-salt diet recommended Recheck 6 months or as needed

## 2011-11-12 NOTE — Patient Instructions (Signed)
Limit your sodium (Salt) intake    It is important that you exercise regularly, at least 20 minutes 3 to 4 times per week.  If you develop chest pain or shortness of breath seek  medical attention.  Return in 6 months for follow-up  

## 2011-12-16 ENCOUNTER — Ambulatory Visit: Payer: Medicare Other | Admitting: Internal Medicine

## 2011-12-20 DIAGNOSIS — B351 Tinea unguium: Secondary | ICD-10-CM | POA: Diagnosis not present

## 2011-12-20 DIAGNOSIS — M79609 Pain in unspecified limb: Secondary | ICD-10-CM | POA: Diagnosis not present

## 2012-03-12 ENCOUNTER — Other Ambulatory Visit: Payer: Self-pay | Admitting: Internal Medicine

## 2012-03-13 DIAGNOSIS — B351 Tinea unguium: Secondary | ICD-10-CM | POA: Diagnosis not present

## 2012-03-13 DIAGNOSIS — M79609 Pain in unspecified limb: Secondary | ICD-10-CM | POA: Diagnosis not present

## 2012-03-20 DIAGNOSIS — L570 Actinic keratosis: Secondary | ICD-10-CM | POA: Diagnosis not present

## 2012-03-26 DIAGNOSIS — M25569 Pain in unspecified knee: Secondary | ICD-10-CM | POA: Diagnosis not present

## 2012-05-01 ENCOUNTER — Ambulatory Visit (INDEPENDENT_AMBULATORY_CARE_PROVIDER_SITE_OTHER): Payer: Medicare Other

## 2012-05-01 DIAGNOSIS — Z23 Encounter for immunization: Secondary | ICD-10-CM | POA: Diagnosis not present

## 2012-05-07 ENCOUNTER — Ambulatory Visit (INDEPENDENT_AMBULATORY_CARE_PROVIDER_SITE_OTHER): Payer: Medicare Other | Admitting: Internal Medicine

## 2012-05-07 ENCOUNTER — Encounter: Payer: Self-pay | Admitting: Internal Medicine

## 2012-05-07 VITALS — BP 122/80 | Temp 97.9°F | Wt 196.0 lb

## 2012-05-07 DIAGNOSIS — I10 Essential (primary) hypertension: Secondary | ICD-10-CM | POA: Diagnosis not present

## 2012-05-07 DIAGNOSIS — E785 Hyperlipidemia, unspecified: Secondary | ICD-10-CM

## 2012-05-07 DIAGNOSIS — K0510 Chronic gingivitis, plaque induced: Secondary | ICD-10-CM

## 2012-05-07 MED ORDER — FIRST-DUKES MOUTHWASH MT SUSP: 5.0000 mL | Freq: Four times a day (QID) | OROMUCOSAL | Status: DC

## 2012-05-07 NOTE — Patient Instructions (Signed)

## 2012-05-07 NOTE — Progress Notes (Signed)
Subjective:    Patient ID: Shelley Robinson, female    DOB: 20-Mar-1927, 78 y.o.   MRN: [id number]  HPI  78 year old patient who has hypertension and dyslipidemia. She presents today with concerns about some irritation in the oral cavity. In general she is doing quite well. She has some mild reflux symptoms controlled with Prilosec. Remains on hydrochlorothiazide and Crestor  Past Medical History  Diagnosis Date  . Hypertension   . GERD (gastroesophageal reflux disease)   . Arthritis     History   Social History  . Marital Status: Widowed    Spouse Name: N/A    Number of Children: N/A  . Years of Education: N/A   Occupational History  . Not on file.   Social History Main Topics  . Smoking status: Former Smoker    Quit date: 07/08/1978  . Smokeless tobacco: Not on file  . Alcohol Use: 0.6 oz/week    1 Glasses of wine per week     once or twice a month  . Drug Use: Not on file  . Sexually Active: Not on file   Other Topics Concern  . Not on file   Social History Narrative  . No narrative on file    Past Surgical History  Procedure Date  . Cholecystectomy   . Abdominal hysterectomy   . Joint replacement   . Tonsillectomy     No family history on file.  Allergies  Allergen Reactions  . Lisinopril Cough  . Piroxicam Other (See Comments)    unknown  . Prednisone Other (See Comments)    hypertensive  . Penicillins Rash    Current Outpatient Prescriptions on File Prior to Visit  Medication Sig Dispense Refill  . acetaminophen (TYLENOL) 500 MG tablet Take 1 tablet (500 mg total) by mouth every 6 (six) hours as needed for pain. Take one  in AM and one at qhs, and one at noon if needed  30 tablet  0  . aspirin 81 MG EC tablet Take 81 mg by mouth daily.       . beta carotene w/minerals (OCUVITE) tablet Take 1 tablet by mouth daily.      . calcium-vitamin D (OSCAL WITH D 500-200) 500-200 MG-UNIT per tablet Take 1 tablet by mouth daily.       .  diphenhydramine-acetaminophen (TYLENOL PM) 25-500 MG TABS Take 1 tablet by mouth at bedtime as needed. For sleep      . fish oil-omega-3 fatty acids 1000 MG capsule Take 1 g by mouth daily.        . hydrochlorothiazide (HYDRODIURIL) 25 MG tablet Take 25 mg by mouth daily.      . hydroxypropyl methylcellulose (ISOPTO TEARS) 2.5 % ophthalmic solution Place 1 drop into both eyes daily as needed.      . Multiple Vitamin (MULTIVITAMIN) capsule Take 1 capsule by mouth daily.       . mupirocin (BACTROBAN) 2 % ointment Apply 1 application topically 2 (two) times daily as needed.       Marland Kitchen omeprazole (PRILOSEC) 20 MG capsule Take 20 mg by mouth 2 (two) times daily.      Marland Kitchen omeprazole (PRILOSEC) 20 MG capsule TAKE ONE CAPSULE (20 MG TOTAL) BY MOUTH TWO TIMES DAILY.  180 capsule  3  . rosuvastatin (CRESTOR) 5 MG tablet Take 5 mg by mouth at bedtime.        BP 122/80  Temp 97.9 F (36.6 C) (Oral)  Wt 196 lb (88.905 kg)  Review of Systems  Constitutional: Negative.   HENT: Negative for hearing loss, congestion, sore throat, rhinorrhea, dental problem, sinus pressure and tinnitus.   Eyes: Negative for pain, discharge and visual disturbance.  Respiratory: Negative for cough and shortness of breath.   Cardiovascular: Negative for chest pain, palpitations and leg swelling.  Gastrointestinal: Negative for nausea, vomiting, abdominal pain, diarrhea, constipation, blood in stool and abdominal distention.  Genitourinary: Negative for dysuria, urgency, frequency, hematuria, flank pain, vaginal bleeding, vaginal discharge, difficulty urinating, vaginal pain and pelvic pain.  Musculoskeletal: Negative for joint swelling, arthralgias and gait problem.  Skin: Negative for rash.       Recent dermatology evaluation and treatment for actinic keratoses  Neurological: Negative for dizziness, syncope, speech difficulty, weakness, numbness and headaches.  Hematological: Negative for adenopathy.    Psychiatric/Behavioral: Negative for behavioral problems, dysphoric mood and agitation. The patient is not nervous/anxious.        Objective:   Physical Exam  Constitutional: She appears well-developed and well-nourished. No distress.       Weight 196 Blood pressure 122/80  HENT:       Dentures were in place and removed Patient has some mild irritation about the left lateral gumline but no obvious ulceration          Assessment & Plan:   Hypertension well controlled Dyslipidemia  The patient was given a prescription for Dukes Magic mouthwash. Her mouth symptoms seem to be improving and largely resolved she will get this filled and use if symptoms recur

## 2012-06-17 DIAGNOSIS — M79609 Pain in unspecified limb: Secondary | ICD-10-CM | POA: Diagnosis not present

## 2012-06-17 DIAGNOSIS — B351 Tinea unguium: Secondary | ICD-10-CM | POA: Diagnosis not present

## 2012-06-18 ENCOUNTER — Encounter: Payer: Medicare Other | Admitting: Internal Medicine

## 2012-09-07 ENCOUNTER — Other Ambulatory Visit: Payer: Self-pay

## 2012-09-07 ENCOUNTER — Other Ambulatory Visit: Payer: Self-pay | Admitting: Internal Medicine

## 2012-09-07 DIAGNOSIS — Z1231 Encounter for screening mammogram for malignant neoplasm of breast: Secondary | ICD-10-CM

## 2012-09-12 ENCOUNTER — Emergency Department (HOSPITAL_BASED_OUTPATIENT_CLINIC_OR_DEPARTMENT_OTHER): Payer: Medicare Other

## 2012-09-12 ENCOUNTER — Emergency Department (HOSPITAL_BASED_OUTPATIENT_CLINIC_OR_DEPARTMENT_OTHER)
Admission: EM | Admit: 2012-09-12 | Discharge: 2012-09-12 | Disposition: A | Discharge status: Home / Self Care | Payer: Medicare Other | Attending: Emergency Medicine | Admitting: Emergency Medicine

## 2012-09-12 DIAGNOSIS — T1490XA Injury, unspecified, initial encounter: Secondary | ICD-10-CM | POA: Diagnosis not present

## 2012-09-12 DIAGNOSIS — I10 Essential (primary) hypertension: Secondary | ICD-10-CM | POA: Insufficient documentation

## 2012-09-12 DIAGNOSIS — S99929A Unspecified injury of unspecified foot, initial encounter: Secondary | ICD-10-CM | POA: Diagnosis not present

## 2012-09-12 DIAGNOSIS — S199XXA Unspecified injury of neck, initial encounter: Secondary | ICD-10-CM | POA: Diagnosis not present

## 2012-09-12 DIAGNOSIS — S0180XA Unspecified open wound of other part of head, initial encounter: Secondary | ICD-10-CM | POA: Diagnosis not present

## 2012-09-12 DIAGNOSIS — M129 Arthropathy, unspecified: Secondary | ICD-10-CM | POA: Insufficient documentation

## 2012-09-12 DIAGNOSIS — S0990XA Unspecified injury of head, initial encounter: Secondary | ICD-10-CM | POA: Diagnosis not present

## 2012-09-12 DIAGNOSIS — S0280XA Fracture of other specified skull and facial bones, unspecified side, initial encounter for closed fracture: Secondary | ICD-10-CM | POA: Diagnosis not present

## 2012-09-12 DIAGNOSIS — S0993XA Unspecified injury of face, initial encounter: Secondary | ICD-10-CM | POA: Diagnosis not present

## 2012-09-12 DIAGNOSIS — Z87891 Personal history of nicotine dependence: Secondary | ICD-10-CM | POA: Insufficient documentation

## 2012-09-12 DIAGNOSIS — Y9389 Activity, other specified: Secondary | ICD-10-CM | POA: Insufficient documentation

## 2012-09-12 DIAGNOSIS — R51 Headache: Secondary | ICD-10-CM | POA: Diagnosis not present

## 2012-09-12 DIAGNOSIS — K219 Gastro-esophageal reflux disease without esophagitis: Secondary | ICD-10-CM | POA: Insufficient documentation

## 2012-09-12 DIAGNOSIS — S0230XA Fracture of orbital floor, unspecified side, initial encounter for closed fracture: Secondary | ICD-10-CM | POA: Insufficient documentation

## 2012-09-12 DIAGNOSIS — S8000XA Contusion of unspecified knee, initial encounter: Secondary | ICD-10-CM | POA: Diagnosis not present

## 2012-09-12 DIAGNOSIS — Z7982 Long term (current) use of aspirin: Secondary | ICD-10-CM | POA: Diagnosis not present

## 2012-09-12 DIAGNOSIS — Y92009 Unspecified place in unspecified non-institutional (private) residence as the place of occurrence of the external cause: Secondary | ICD-10-CM | POA: Insufficient documentation

## 2012-09-12 DIAGNOSIS — W19XXXA Unspecified fall, initial encounter: Secondary | ICD-10-CM

## 2012-09-12 DIAGNOSIS — S8990XA Unspecified injury of unspecified lower leg, initial encounter: Secondary | ICD-10-CM | POA: Diagnosis not present

## 2012-09-12 DIAGNOSIS — S0181XA Laceration without foreign body of other part of head, initial encounter: Secondary | ICD-10-CM

## 2012-09-12 DIAGNOSIS — S91009A Unspecified open wound, unspecified ankle, initial encounter: Secondary | ICD-10-CM | POA: Diagnosis not present

## 2012-09-12 DIAGNOSIS — Z79899 Other long term (current) drug therapy: Secondary | ICD-10-CM | POA: Insufficient documentation

## 2012-09-12 DIAGNOSIS — S023XXA Fracture of orbital floor, initial encounter for closed fracture: Secondary | ICD-10-CM

## 2012-09-12 DIAGNOSIS — S81009A Unspecified open wound, unspecified knee, initial encounter: Secondary | ICD-10-CM | POA: Diagnosis not present

## 2012-09-12 DIAGNOSIS — S0190XA Unspecified open wound of unspecified part of head, initial encounter: Secondary | ICD-10-CM | POA: Diagnosis not present

## 2012-09-12 DIAGNOSIS — W1809XA Striking against other object with subsequent fall, initial encounter: Secondary | ICD-10-CM | POA: Insufficient documentation

## 2012-09-12 MED ORDER — LIDOCAINE-EPINEPHRINE 2 %-1:100000 IJ SOLN
INTRAMUSCULAR | Status: AC
  Filled 2012-09-12: qty 1

## 2012-09-12 MED ORDER — CEPHALEXIN 500 MG PO CAPS: 500.0000 mg | ORAL_CAPSULE | Freq: Four times a day (QID) | ORAL | Status: DC

## 2012-09-12 NOTE — ED Notes (Addendum)
EMS transport from home- pt was dusting and "knee went out"- and she went down on her knee and hit head on dresser- lac to face from glasses- denied neck and back pain to ems

## 2012-09-12 NOTE — ED Provider Notes (Signed)
History  This chart was scribed for Shaune Pollack, MD, by Truddie Coco, ED Scribe. This patient was seen in room MH01/MH01 and the patient's care was started at 7:01 PM   CSN: [id number]  Arrival date & time 09/12/12  1815   First MD Initiated Contact with Patient 09/12/12 1900      Chief Complaint  Patient presents with  . Laceration  . Head Injury    The history is provided by the patient. No language interpreter was used.   Shelley Robinson is a 79 y.o. female who presents to the Emergency Department, brought in by ambulance, complaining of a head injury and laceration above her right eye after falling while dusting earlier today hitting her head on a dresser.  She reports that her knees went out which caused the fall.  Pt denies LOC, nausea, or vomiting.  She is also experiencing pain to bilateral knees.  Pt reports tetanus is up to date.  She has taken nothing for the pain.  Pt lives alone and used her lifeline service to get help.  She reports she needed assistance to get off the floor.  Pt reports that she uses a walker only at night.  She was not using the walker when she fell.         Past Medical History  Diagnosis Date  . Hypertension   . GERD (gastroesophageal reflux disease)   . Arthritis     Past Surgical History  Procedure Laterality Date  . Cholecystectomy    . Abdominal hysterectomy    . Joint replacement    . Tonsillectomy      No family history on file.  History  Substance Use Topics  . Smoking status: Former Smoker    Quit date: 07/08/1978  . Smokeless tobacco: Not on file  . Alcohol Use: 0.6 oz/week    1 Glasses of wine per week     Comment: once or twice a month    OB History   Grav Para Term Preterm Abortions TAB SAB Ect Mult Living                  Review of Systems  Eyes: Negative for visual disturbance.  Musculoskeletal: Positive for arthralgias (bilateral knee pain and bruising).  Skin: Positive for wound (laceration above the  right eye).  Neurological: Negative for syncope.  All other systems reviewed and are negative.    Allergies  Lisinopril; Piroxicam; Prednisone; and Penicillins  Home Medications   Current Outpatient Rx  Name  Route  Sig  Dispense  Refill  . acetaminophen (TYLENOL) 500 MG tablet   Oral   Take 1 tablet (500 mg total) by mouth every 6 (six) hours as needed for pain. Take one  in AM and one at qhs, and one at noon if needed   30 tablet   0   . aspirin 81 MG EC tablet   Oral   Take 81 mg by mouth daily.          . beta carotene w/minerals (OCUVITE) tablet   Oral   Take 1 tablet by mouth daily.         . calcium-vitamin D (OSCAL WITH D 500-200) 500-200 MG-UNIT per tablet   Oral   Take 1 tablet by mouth daily.          . Diphenhyd-Hydrocort-Nystatin (FIRST-DUKES MOUTHWASH) SUSP   Mouth/Throat   Use as directed 5 mLs in the mouth or throat QID.   1  Bottle   1   . diphenhydramine-acetaminophen (TYLENOL PM) 25-500 MG TABS   Oral   Take 1 tablet by mouth at bedtime as needed. For sleep         . fish oil-omega-3 fatty acids 1000 MG capsule   Oral   Take 1 g by mouth daily.           . hydrochlorothiazide (HYDRODIURIL) 25 MG tablet   Oral   Take 25 mg by mouth daily.         . hydrochlorothiazide (HYDRODIURIL) 25 MG tablet      TAKE ONE TABLET BY MOUTH DAILY.   90 tablet   3     Rx has expired   . hydroxypropyl methylcellulose (ISOPTO TEARS) 2.5 % ophthalmic solution   Both Eyes   Place 1 drop into both eyes daily as needed.         . Multiple Vitamin (MULTIVITAMIN) capsule   Oral   Take 1 capsule by mouth daily.          . mupirocin (BACTROBAN) 2 % ointment   Topical   Apply 1 application topically 2 (two) times daily as needed.          Marland Kitchen omeprazole (PRILOSEC) 20 MG capsule   Oral   Take 20 mg by mouth 2 (two) times daily.         Marland Kitchen omeprazole (PRILOSEC) 20 MG capsule      TAKE ONE CAPSULE (20 MG TOTAL) BY MOUTH TWO TIMES DAILY.    180 capsule   3     No refills available   . rosuvastatin (CRESTOR) 5 MG tablet   Oral   Take 5 mg by mouth at bedtime.           BP 159/91  Pulse 91  Temp(Src) 97.8 F (36.6 C) (Oral)  Resp 20  SpO2 98%  Physical Exam  Nursing note and vitals reviewed. Constitutional: She is oriented to person, place, and time. She appears well-developed and well-nourished. No distress.  HENT:  4 cm laceration above the right eye, along the eyebrow.   Eyes: EOM are normal. Pupils are equal, round, and reactive to light.  Right subconjunctival hematoma.   Neck: Normal range of motion. Neck supple.  Cardiovascular: Normal rate.   Pulmonary/Chest: Effort normal. No respiratory distress.  Abdominal: Soft. There is no tenderness.  Musculoskeletal: Normal range of motion.  No spinal tenderness. Bruising and tenderness to bilateral knees.  Swelling to right knee.  Abrasion to dorsal side of hand.   Neurological: She is alert and oriented to person, place, and time.  Skin: Skin is warm and dry.  Psychiatric: She has a normal mood and affect. Her behavior is normal.    ED Course  Procedures   DIAGNOSTIC STUDIES: Oxygen Saturation is 98% on room air, normal by my interpretation.    COORDINATION OF CARE:  7:06 PM Discussed course of care with pt which includes laceration repair and head CT, cervical spine CT, and maxillofacial CT.  Pt understands and agrees.   9:50 PM Discussed images with pt and advised follow up with ENT.  Pt understands and agrees.  Assessed pts ability to ambulate.   LACERATION REPAIR PROCEDURE NOTE The patient's identification was confirmed and consent was obtained. This procedure was performed by Shaune Pollack, MD at 7:06 PM. Site: right eyebrow Sterile procedures observed Anesthetic used (type and amt): lidocaine with epi, 4 cc uses, local infiltration Copious irrigation with saline solution,  no foreign bodies.   Suture type/size: 5-0 prolene sutures  used Length: 4 cm # of Sutures: 9 sutures  Tetanus UTD Site anesthetized, irrigated with NS, explored without evidence of foreign body, wound well approximated, site covered with dry, sterile dressing.  Patient tolerated procedure well without complications. Instructions for care discussed verbally and patient provided with additional written instructions for homecare and f/u.   Labs Reviewed - No data to display Ct Head Wo Contrast  09/12/2012  *RADIOLOGY REPORT*  Clinical Data:  Head injury with laceration  CT HEAD WITHOUT CONTRAST CT MAXILLOFACIAL WITHOUT CONTRAST CT CERVICAL SPINE WITHOUT CONTRAST  Technique:  Multidetector CT imaging of the head, cervical spine, and maxillofacial structures were performed using the standard protocol without intravenous contrast. Multiplanar CT image reconstructions of the cervical spine and maxillofacial structures were also generated.  Comparison:   None  CT HEAD  Findings: Mild atrophy and mild chronic microvascular ischemia.  No acute infarct, hemorrhage, or mass lesion.  Soft tissue swelling right forehead with laceration.  Air-fluid level right maxillary sinus.  Negative for skull fracture.  IMPRESSION: Atrophy and chronic microvascular ischemia.  No acute intracranial abnormality.  CT MAXILLOFACIAL  Findings:  Mildly depressed fracture anterior orbital floor on the right.  Small air-fluid level in the right maxillary sinus.  No other fractures.  The left orbit is intact.  The nasal bone is intact.  The mandible and zygoma are intact.  There is advanced degenerative change in the TMJ bilaterally.  Soft tissue swelling right forehead and anterior to the right maxilla.  IMPRESSION: Small fracture with mild depression of the anterior floor of the orbit on the right.  CT CERVICAL SPINE  Findings:   Normal cervical alignment.  Negative for fracture or mass.  There is disc degeneration and spondylosis and facet degeneration throughout the cervical spine.  IMPRESSION:  Negative for cervical spine fracture.   Original Report Authenticated By: Carl Best, M.D.    Ct Cervical Spine Wo Contrast  09/12/2012  *RADIOLOGY REPORT*  Clinical Data:  Head injury with laceration  CT HEAD WITHOUT CONTRAST CT MAXILLOFACIAL WITHOUT CONTRAST CT CERVICAL SPINE WITHOUT CONTRAST  Technique:  Multidetector CT imaging of the head, cervical spine, and maxillofacial structures were performed using the standard protocol without intravenous contrast. Multiplanar CT image reconstructions of the cervical spine and maxillofacial structures were also generated.  Comparison:   None  CT HEAD  Findings: Mild atrophy and mild chronic microvascular ischemia.  No acute infarct, hemorrhage, or mass lesion.  Soft tissue swelling right forehead with laceration.  Air-fluid level right maxillary sinus.  Negative for skull fracture.  IMPRESSION: Atrophy and chronic microvascular ischemia.  No acute intracranial abnormality.  CT MAXILLOFACIAL  Findings:  Mildly depressed fracture anterior orbital floor on the right.  Small air-fluid level in the right maxillary sinus.  No other fractures.  The left orbit is intact.  The nasal bone is intact.  The mandible and zygoma are intact.  There is advanced degenerative change in the TMJ bilaterally.  Soft tissue swelling right forehead and anterior to the right maxilla.  IMPRESSION: Small fracture with mild depression of the anterior floor of the orbit on the right.  CT CERVICAL SPINE  Findings:   Normal cervical alignment.  Negative for fracture or mass.  There is disc degeneration and spondylosis and facet degeneration throughout the cervical spine.  IMPRESSION: Negative for cervical spine fracture.   Original Report Authenticated By: Carl Best, M.D.    Dg Knee Complete  4 Views Left  09/12/2012  *RADIOLOGY REPORT*  Clinical Data: Fall.  Laceration  LEFT KNEE - COMPLETE 4+ VIEW  Comparison: 04/05/2009  Findings: Normal alignment and no fracture.  There is advanced  degenerative change with joint space narrowing and spurring of the medial compartment.  There is mild degenerative change and spurring of the patella.  IMPRESSION: Negative for fracture.   Original Report Authenticated By: Carl Best, M.D.    Dg Knee Complete 4 Views Right  09/12/2012  *RADIOLOGY REPORT*  Clinical Data: Fall  RIGHT KNEE - COMPLETE 4+ VIEW  Comparison: None.  Findings: Negative for fracture or malalignment.  Joint space narrowing and spurring in the medial compartment.  Soft tissue swelling overlying the patella.  IMPRESSION: Negative for fracture.   Original Report Authenticated By: Carl Best, M.D.    Ct Maxillofacial Wo Cm  09/12/2012  *RADIOLOGY REPORT*  Clinical Data:  Head injury with laceration  CT HEAD WITHOUT CONTRAST CT MAXILLOFACIAL WITHOUT CONTRAST CT CERVICAL SPINE WITHOUT CONTRAST  Technique:  Multidetector CT imaging of the head, cervical spine, and maxillofacial structures were performed using the standard protocol without intravenous contrast. Multiplanar CT image reconstructions of the cervical spine and maxillofacial structures were also generated.  Comparison:   None  CT HEAD  Findings: Mild atrophy and mild chronic microvascular ischemia.  No acute infarct, hemorrhage, or mass lesion.  Soft tissue swelling right forehead with laceration.  Air-fluid level right maxillary sinus.  Negative for skull fracture.  IMPRESSION: Atrophy and chronic microvascular ischemia.  No acute intracranial abnormality.  CT MAXILLOFACIAL  Findings:  Mildly depressed fracture anterior orbital floor on the right.  Small air-fluid level in the right maxillary sinus.  No other fractures.  The left orbit is intact.  The nasal bone is intact.  The mandible and zygoma are intact.  There is advanced degenerative change in the TMJ bilaterally.  Soft tissue swelling right forehead and anterior to the right maxilla.  IMPRESSION: Small fracture with mild depression of the anterior floor of the orbit on the  right.  CT CERVICAL SPINE  Findings:   Normal cervical alignment.  Negative for fracture or mass.  There is disc degeneration and spondylosis and facet degeneration throughout the cervical spine.  IMPRESSION: Negative for cervical spine fracture.   Original Report Authenticated By: Carl Best, M.D.      No diagnosis found.    MDM  This is a an 79 year old female who is tested today and fell striking her right on a dresser landing on both her knees. CT of her head and neck are normal CT of her maxillofacial bones show a minimally displaced right orbital floor fracture. Knees are not fracture that are very contused. The patient had a laceration above her right eye repaired. She believes her tetanus is up-to-date. He is placed on Keflex for the orbital for fracture and referred to ENT. She is given instructions to call on Monday morning for followup early in the week next week. Patient and related here without difficulty. She and her family are devised and the need for followup and have somebody with her until she is feeling improved.  I personally performed the services described in this documentation, which was scribed in my presence. The recorded information has been reviewed and considered.   Shaune Pollack, MD 09/12/12 2350

## 2012-09-12 NOTE — ED Notes (Signed)
The patient is undressed and in a gown. The suture cart is at the bedside.

## 2012-09-17 ENCOUNTER — Encounter: Payer: Self-pay | Admitting: Internal Medicine

## 2012-09-17 ENCOUNTER — Ambulatory Visit (INDEPENDENT_AMBULATORY_CARE_PROVIDER_SITE_OTHER): Payer: Medicare Other | Admitting: Internal Medicine

## 2012-09-17 VITALS — BP 160/90 | HR 93 | Temp 98.0°F | Resp 20 | Wt 198.0 lb

## 2012-09-17 DIAGNOSIS — W19XXXA Unspecified fall, initial encounter: Secondary | ICD-10-CM | POA: Diagnosis not present

## 2012-09-17 DIAGNOSIS — S0230XA Fracture of orbital floor, unspecified side, initial encounter for closed fracture: Secondary | ICD-10-CM | POA: Diagnosis not present

## 2012-09-17 DIAGNOSIS — S0180XA Unspecified open wound of other part of head, initial encounter: Secondary | ICD-10-CM | POA: Diagnosis not present

## 2012-09-17 DIAGNOSIS — I10 Essential (primary) hypertension: Secondary | ICD-10-CM | POA: Diagnosis not present

## 2012-09-17 NOTE — Patient Instructions (Signed)
Call or return to clinic prn if these symptoms worsen or fail to improve as anticipated.  Return as scheduled next month for your annual exam

## 2012-09-17 NOTE — Progress Notes (Signed)
Subjective:    Patient ID: Shelley Robinson, female    DOB: [birth year], 79 y.o.   MRN: [id number]  HPI  79 year old patient who is seen today in followup. She was seen in the ED on March 8 after falling in her bedroom. She sustained considerable trauma to the right periOrbital area as well as both knees. She was seen in the ED and had extensive imaging studies performed including multiple images of both knees as well as CT scan of the head neck and maxillofacial bones.  She was seen and followed by ENT earlier today and had sutures removed from a laceration involving her right eyebrow. There was no syncope; the patient states that she tripped. Past Medical History  Diagnosis Date  . Hypertension   . GERD (gastroesophageal reflux disease)   . Arthritis     History   Social History  . Marital Status: Widowed    Spouse Name: N/A    Number of Children: N/A  . Years of Education: N/A   Occupational History  . Not on file.   Social History Main Topics  . Smoking status: Former Smoker    Quit date: 07/08/1978  . Smokeless tobacco: Not on file  . Alcohol Use: 0.6 oz/week    1 Glasses of wine per week     Comment: once or twice a month  . Drug Use: Not on file  . Sexually Active: Not on file   Other Topics Concern  . Not on file   Social History Narrative  . No narrative on file    Past Surgical History  Procedure Laterality Date  . Cholecystectomy    . Abdominal hysterectomy    . Joint replacement    . Tonsillectomy      No family history on file.  Allergies  Allergen Reactions  . Lisinopril Cough  . Piroxicam Other (See Comments)    unknown  . Prednisone Other (See Comments)    hypertensive  . Penicillins Rash    Current Outpatient Prescriptions on File Prior to Visit  Medication Sig Dispense Refill  . acetaminophen (TYLENOL) 500 MG tablet Take 1 tablet (500 mg total) by mouth every 6 (six) hours as needed for pain. Take one  in AM and one at qhs, and one at  noon if needed  30 tablet  0  . aspirin 81 MG EC tablet Take 81 mg by mouth daily.       . beta carotene w/minerals (OCUVITE) tablet Take 1 tablet by mouth daily.      . calcium-vitamin D (OSCAL WITH D 500-200) 500-200 MG-UNIT per tablet Take 1 tablet by mouth daily.       . cephALEXin (KEFLEX) 500 MG capsule Take 1 capsule (500 mg total) by mouth 4 (four) times daily.  40 capsule  0  . Diphenhyd-Hydrocort-Nystatin (FIRST-DUKES MOUTHWASH) SUSP Use as directed 5 mLs in the mouth or throat QID.  1 Bottle  1  . diphenhydramine-acetaminophen (TYLENOL PM) 25-500 MG TABS Take 1 tablet by mouth at bedtime as needed. For sleep      . fish oil-omega-3 fatty acids 1000 MG capsule Take 1 g by mouth daily.        . hydrochlorothiazide (HYDRODIURIL) 25 MG tablet TAKE ONE TABLET BY MOUTH DAILY.  90 tablet  3  . hydroxypropyl methylcellulose (ISOPTO TEARS) 2.5 % ophthalmic solution Place 1 drop into both eyes daily as needed.      . Multiple Vitamin (MULTIVITAMIN) capsule Take 1 capsule by  mouth daily.       . mupirocin (BACTROBAN) 2 % ointment Apply 1 application topically 2 (two) times daily as needed.       Marland Kitchen omeprazole (PRILOSEC) 20 MG capsule TAKE ONE CAPSULE (20 MG TOTAL) BY MOUTH TWO TIMES DAILY.  180 capsule  3  . rosuvastatin (CRESTOR) 5 MG tablet Take 5 mg by mouth at bedtime.       No current facility-administered medications on file prior to visit.    BP 160/90  Pulse 93  Temp(Src) 98 F (36.7 C) (Oral)  Resp 20  Wt 198 lb (89.812 kg)  BMI 35.62 kg/m2  SpO2 93%       Review of Systems  Constitutional: Negative.   HENT: Negative for hearing loss, congestion, sore throat, rhinorrhea, dental problem, sinus pressure and tinnitus.   Eyes: Negative for pain, discharge and visual disturbance.  Respiratory: Negative for cough and shortness of breath.   Cardiovascular: Negative for chest pain, palpitations and leg swelling.  Gastrointestinal: Negative for nausea, vomiting, abdominal pain,  diarrhea, constipation, blood in stool and abdominal distention.  Genitourinary: Negative for dysuria, urgency, frequency, hematuria, flank pain, vaginal bleeding, vaginal discharge, difficulty urinating, vaginal pain and pelvic pain.  Musculoskeletal: Positive for back pain and arthralgias (Bilateral knee pain right greater than the left). Negative for joint swelling and gait problem.  Skin: Positive for rash and wound.  Neurological: Negative for dizziness, syncope, speech difficulty, weakness, numbness and headaches.  Hematological: Negative for adenopathy.  Psychiatric/Behavioral: Negative for behavioral problems, dysphoric mood and agitation. The patient is not nervous/anxious.        Objective:   Physical Exam  Constitutional: She is oriented to person, place, and time. She appears well-developed and well-nourished.  HENT:  Head: Normocephalic.  Right Ear: External ear normal.  Left Ear: External ear normal.  Mouth/Throat: Oropharynx is clear and moist.  Eyes: Conjunctivae and EOM are normal. Pupils are equal, round, and reactive to light.  First laceration right eyebrow extensive the right-sided periorbital ecchymoses and right subconjunctival hemorrhage. Extraocular muscles were intact. Her responses are normal  Neck: Normal range of motion. Neck supple. No thyromegaly present.  Cardiovascular: Normal rate, regular rhythm, normal heart sounds and intact distal pulses.   Pulmonary/Chest: Effort normal and breath sounds normal.  Abdominal: Soft. Bowel sounds are normal. She exhibits no mass. There is no tenderness.  Musculoskeletal: Normal range of motion.  Right knee was slightly tender to touch and slightly warm. Extensive ecchymoses were noted involving the right knee and lower leg  Lymphadenopathy:    She has no cervical adenopathy.  Neurological: She is alert and oriented to person, place, and time.  Skin: Skin is warm and dry. No rash noted.  Psychiatric: She has a normal  mood and affect. Her behavior is normal.          Assessment & Plan:  Status post fall with multiple facial trauma Contusion right knee Hypertension stable History of dyslipidemia  The patient will continue using a walker for a balance and slowly increase her level of activity. She is using Tylenol only for pain which she states has been sufficient. She does have some home assistance

## 2012-09-24 ENCOUNTER — Telehealth: Payer: Self-pay | Admitting: Internal Medicine

## 2012-09-24 DIAGNOSIS — L02419 Cutaneous abscess of limb, unspecified: Secondary | ICD-10-CM | POA: Diagnosis not present

## 2012-09-24 DIAGNOSIS — L03119 Cellulitis of unspecified part of limb: Secondary | ICD-10-CM | POA: Diagnosis not present

## 2012-09-24 NOTE — Telephone Encounter (Signed)
Patient Information:  Caller Name: Sugey  Phone: [phone]  Patient: Shelley, Robinson  Gender: Female  DOB: Apr 04, 1927  Age: 79 Years  PCP: Bluford Kaufmann [facility])  Office Follow Up:  Does the office need to follow up with this patient?: Yes  Instructions For The Office: Just for information- patient sent to ED due to right lower leg being warm, knotty and shiny red in appearance.  RN Note:  Rates right lower leg pain as at rest 3-4/10 with 10/10 on movement.  Is concerned because patient believes area is more swollen, red, and warm to touch than it has been or the other has been.  It feels like it has knots underneath.  Disposition is to be seen in ED and with time being 16:30-office protocol request UC or ED disposition if needed.  Care advice given.  Caller has someone that can take her to the ED and she is willing to go.  Symptoms  Reason For Call & Symptoms: Right leg was injured in a fall -mainly the knee.  Foot is better but the knee to calf appears to be shiny and red under the skin and knotty.  Warm to touch Mainly on front of lower let.  Other in juried leg does not look the same.  It is pain to move which is more than usual, tight and swollen.  Reviewed Health History In EMR: Yes  Reviewed Medications In EMR: Yes  Reviewed Allergies In EMR: Yes  Reviewed Surgeries / Procedures: Yes  Date of Onset of Symptoms: 09/17/2012  Guideline(s) Used:  Leg Pain  Disposition Per Guideline:   Go to ED Now (or to Office with PCP Approval)  Reason For Disposition Reached:   Fever and red area (or area very tender to touch)  Advice Given:  Rest:   You should try to avoid any exercise or activity that caused this pain for the next 3 days.  Rest:   You should try to avoid any exercise or activity that caused this pain for the next 3 days.  Call Back If:  You become worse.  RN Overrode Recommendation:  Go To ED  With it being 16:30 per office request, patient sent  to ED

## 2012-09-28 DIAGNOSIS — L03119 Cellulitis of unspecified part of limb: Secondary | ICD-10-CM | POA: Diagnosis not present

## 2012-09-28 DIAGNOSIS — L02419 Cutaneous abscess of limb, unspecified: Secondary | ICD-10-CM | POA: Diagnosis not present

## 2012-10-05 ENCOUNTER — Ambulatory Visit
Admission: RE | Admit: 2012-10-05 | Discharge: 2012-10-05 | Disposition: A | Discharge status: Home / Self Care | Payer: Medicare Other | Source: Ambulatory Visit

## 2012-10-05 DIAGNOSIS — Z1231 Encounter for screening mammogram for malignant neoplasm of breast: Secondary | ICD-10-CM

## 2012-10-05 DIAGNOSIS — L02419 Cutaneous abscess of limb, unspecified: Secondary | ICD-10-CM | POA: Diagnosis not present

## 2012-10-05 DIAGNOSIS — L03119 Cellulitis of unspecified part of limb: Secondary | ICD-10-CM | POA: Diagnosis not present

## 2012-10-28 DIAGNOSIS — H02409 Unspecified ptosis of unspecified eyelid: Secondary | ICD-10-CM | POA: Diagnosis not present

## 2012-10-28 DIAGNOSIS — H3530 Unspecified macular degeneration: Secondary | ICD-10-CM | POA: Diagnosis not present

## 2012-10-28 DIAGNOSIS — H5210 Myopia, unspecified eye: Secondary | ICD-10-CM | POA: Diagnosis not present

## 2012-10-28 DIAGNOSIS — Z961 Presence of intraocular lens: Secondary | ICD-10-CM | POA: Diagnosis not present

## 2012-10-29 ENCOUNTER — Ambulatory Visit (INDEPENDENT_AMBULATORY_CARE_PROVIDER_SITE_OTHER): Payer: Medicare Other | Admitting: Internal Medicine

## 2012-10-29 ENCOUNTER — Encounter: Payer: Self-pay | Admitting: Internal Medicine

## 2012-10-29 VITALS — BP 122/80 | HR 72 | Temp 98.0°F | Resp 20 | Ht 60.75 in | Wt 190.0 lb

## 2012-10-29 DIAGNOSIS — I10 Essential (primary) hypertension: Secondary | ICD-10-CM | POA: Diagnosis not present

## 2012-10-29 DIAGNOSIS — Z0000 Encounter for general adult medical examination without abnormal findings: Secondary | ICD-10-CM | POA: Diagnosis not present

## 2012-10-29 DIAGNOSIS — M1990 Unspecified osteoarthritis, unspecified site: Secondary | ICD-10-CM | POA: Diagnosis not present

## 2012-10-29 DIAGNOSIS — R0789 Other chest pain: Secondary | ICD-10-CM | POA: Diagnosis not present

## 2012-10-29 DIAGNOSIS — E785 Hyperlipidemia, unspecified: Secondary | ICD-10-CM | POA: Diagnosis not present

## 2012-10-29 LAB — COMPREHENSIVE METABOLIC PANEL
ALT: 27 U/L (ref 0–35)
Albumin: 3.9 g/dL (ref 3.5–5.2)
BUN: 20 mg/dL (ref 6–23)
CO2: 32 mEq/L (ref 19–32)
Calcium: 9.5 mg/dL (ref 8.4–10.5)
Chloride: 103 mEq/L (ref 96–112)
Creatinine, Ser: 1.1 mg/dL (ref 0.4–1.2)
GFR: 48.28 mL/min — ABNORMAL LOW (ref >60.00)
Potassium: 4.9 mEq/L (ref 3.5–5.1)

## 2012-10-29 LAB — CBC WITH DIFFERENTIAL/PLATELET
Basophils Relative: 0.4 % (ref 0.0–3.0)
Eosinophils Relative: 2.3 % (ref 0.0–5.0)
Hemoglobin: 15 g/dL (ref 12.0–15.0)
Lymphs Abs: 1.2 10*3/uL (ref 0.7–4.0)
MCHC: 33.2 g/dL (ref 30.0–36.0)
MCV: 98.3 fl (ref 78.0–100.0)
Monocytes Absolute: 0.5 10*3/uL (ref 0.1–1.0)
Platelets: 219 10*3/uL (ref 150.0–400.0)
RBC: 4.59 Mil/uL (ref 3.87–5.11)

## 2012-10-29 LAB — LIPID PANEL
Cholesterol: 145 mg/dL (ref 0–200)
Triglycerides: 169 mg/dL — ABNORMAL HIGH (ref 0.0–149.0)

## 2012-10-29 NOTE — Patient Instructions (Signed)
Limit your sodium (Salt) intake    It is important that you exercise regularly, at least 20 minutes 3 to 4 times per week.  If you develop chest pain or shortness of breath seek  medical attention.  Return in 6 months for follow-up  

## 2012-10-29 NOTE — Progress Notes (Signed)
Patient ID: Shelley Robinson, female   DOB: 1926/11/29, 79 y.o.   MRN: [id number]  Subjective:    Patient ID: Shelley Robinson, female    DOB: 01-20-1927, 79 y.o.   MRN: [id number]  HPI CC: cpx - doing ok.   History of Present Illness:   Wt Readings from Last 3 Encounters:  10/29/12 190 lb (86.183 kg)  09/17/12 198 lb (89.812 kg)  05/07/12 196 lb (88.23 kg)    79  year-old patient who is seen today for a comprehensive evaluation. She fractured her left humerus in October of 2010 and had surgery at [facility]. She states that she had a 2-D echocardiogram at that time. She has long a long history of dyspnea on exertion. She has treated hypertension, dyslipidemia, and significant osteoarthritis. She has a history of exogenous obesity.   Here for Medicare AWV:   1. Risk factors based on Past M, S, F history: risk factors include hypertension, and dyslipidemia.  2. Physical Activities: very sedentary due to obesity, arthritis, and dyspnea on exertion  3. Depression/mood: history depression, or mood disorder  4. Hearing: no deficits  5. ADL's: independent in all aspects of daily living, but sedentary  6. Fall Risk: moderate due to age, obesity, arthritis  7. Home Safety: no problems identified  8. Height, weight, &visual acuity: there continues to be modest weight gain. No difficulty with visual acuity  9. Counseling: weight loss encouraged  10. Labs ordered based on risk factors: laboratory profile, including lipid panel will be reviewed  11. Referral Coordination-  N/A 12. Care Plan- weight loss, heart healthy diet or exercise. All encouraged  13. Cognitive Assessment- alert and oriented, with a normal affect. She feels that she may have become slightly more forgetful. 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 No family concerns with memory   Preventive Screening-Counseling & Management  Alcohol-Tobacco  Smoking Status: quit   Allergies:  1) ! Penicillin G Pot  in Dextrose (Penicillin G Potassium in D5w)  2) ! Feldene   Past History:  Past Medical History:   Hyperlipidemia  Hypertension  DJD  Low back pain  Knee pain  exogenous obesity  Osteoarthritis  mild hypoxemia   Past Surgical History:   Appendectomy  Cholecystectomy  Hysterectomy  Tonsillectomy  cataract surgery OD  2 D Echo 10-11 ([facility])  colonoscopy none- prior sigmoidoscopy 2003  surgery for fracture, left humerus September 2010   Family History:  Reviewed history from 01/16/2009 and no changes required.   Father ded age 46: cerebrovascular disease CAD, prostate cancer  mother died at age 25, senile dementia  one sister died of a lymphoma   Social History:  Reviewed history from 01/16/2009 and no changes required.  3 children  Widow/[place]-October 2006    Review of Systems  Constitutional: Negative for fever, appetite change, fatigue and unexpected weight change.  HENT: Negative for hearing loss, ear pain, nosebleeds, congestion, sore throat, mouth sores, trouble swallowing, neck stiffness, dental problem, voice change, sinus pressure and tinnitus.   Eyes: Negative for photophobia, pain, redness and visual disturbance.  Respiratory: Negative for cough, chest tightness and shortness of breath.   Cardiovascular: Negative for chest pain, palpitations and leg swelling.  Gastrointestinal: Negative for nausea, vomiting, abdominal pain, diarrhea, constipation, blood in stool, abdominal distention and rectal pain.  Genitourinary: Negative for dysuria, urgency, frequency, hematuria, flank pain, vaginal bleeding, vaginal discharge, difficulty urinating, genital sores, vaginal pain, menstrual problem and pelvic pain.  Musculoskeletal: Positive for back pain and  gait problem. Negative for arthralgias.  Skin: Negative for rash.  Neurological: Negative for dizziness, syncope, speech difficulty, weakness, light-headedness, numbness and headaches.  Hematological: Negative for  adenopathy. Does not bruise/bleed easily.  Psychiatric/Behavioral: Negative for suicidal ideas, behavioral problems, self-injury, dysphoric mood and agitation. The patient is not nervous/anxious.        Objective:   Physical Exam  Constitutional: She is oriented to person, place, and time. She appears well-developed and well-nourished.  HENT:  Head: Normocephalic and atraumatic.  Right Ear: External ear normal.  Left Ear: External ear normal.  Mouth/Throat: Oropharynx is clear and moist.  Dentures in place  Pharyngeal crowding  Eyes: Conjunctivae and EOM are normal.  Neck: Normal range of motion. Neck supple. No JVD present. No thyromegaly present.  Cardiovascular: Normal rate, regular rhythm and normal heart sounds.   No murmur heard. left dorsalis pedis pulse full  Pulmonary/Chest: Effort normal and breath sounds normal. She has no wheezes. She has no rales.  Abdominal: Soft. Bowel sounds are normal. She exhibits no distension and no mass. There is no tenderness. There is no rebound and no guarding.  Genitourinary: Vagina normal.  Musculoskeletal: Normal range of motion. She exhibits edema. She exhibits no tenderness.  Trace lower extremity edema  Neurological: She is alert and oriented to person, place, and time. She has normal reflexes. No cranial nerve deficit. She exhibits normal muscle tone. Coordination normal.  Skin: Skin is warm and dry. No rash noted.  Dry flaky skin of the feet especially laterally Onychomycotic toes  Psychiatric: She has a normal mood and affect. Her behavior is normal.          Assessment & Plan:   Preventive health examination Hypertension stable Exogenous obesity. Weight loss encouraged (has lost 8 pounds over the past 5 weeks) Dyslipidemia. We'll check a lipid panel continue Crestor Osteoarthritis with chronic low back pain   Recheck 6 months

## 2012-11-06 DIAGNOSIS — M79609 Pain in unspecified limb: Secondary | ICD-10-CM | POA: Diagnosis not present

## 2012-11-06 DIAGNOSIS — B351 Tinea unguium: Secondary | ICD-10-CM | POA: Diagnosis not present

## 2012-11-13 ENCOUNTER — Ambulatory Visit: Payer: Medicare Other | Attending: Podiatry | Admitting: Physical Therapy

## 2012-11-13 DIAGNOSIS — R5381 Other malaise: Secondary | ICD-10-CM | POA: Insufficient documentation

## 2012-11-13 DIAGNOSIS — M25579 Pain in unspecified ankle and joints of unspecified foot: Secondary | ICD-10-CM | POA: Diagnosis not present

## 2012-11-13 DIAGNOSIS — M25676 Stiffness of unspecified foot, not elsewhere classified: Secondary | ICD-10-CM | POA: Insufficient documentation

## 2012-11-13 DIAGNOSIS — M25673 Stiffness of unspecified ankle, not elsewhere classified: Secondary | ICD-10-CM | POA: Insufficient documentation

## 2012-11-13 DIAGNOSIS — IMO0001 Reserved for inherently not codable concepts without codable children: Secondary | ICD-10-CM | POA: Insufficient documentation

## 2012-11-20 ENCOUNTER — Ambulatory Visit: Payer: Medicare Other | Admitting: Rehabilitative and Restorative Service Providers"

## 2012-12-01 ENCOUNTER — Ambulatory Visit: Payer: Medicare Other | Admitting: Rehabilitative and Restorative Service Providers"

## 2012-12-04 ENCOUNTER — Ambulatory Visit: Payer: Medicare Other | Admitting: Rehabilitative and Restorative Service Providers"

## 2012-12-07 ENCOUNTER — Ambulatory Visit: Payer: Medicare Other | Attending: Podiatry | Admitting: Rehabilitative and Restorative Service Providers"

## 2012-12-07 DIAGNOSIS — M25676 Stiffness of unspecified foot, not elsewhere classified: Secondary | ICD-10-CM | POA: Insufficient documentation

## 2012-12-07 DIAGNOSIS — IMO0001 Reserved for inherently not codable concepts without codable children: Secondary | ICD-10-CM | POA: Insufficient documentation

## 2012-12-07 DIAGNOSIS — R5381 Other malaise: Secondary | ICD-10-CM | POA: Diagnosis not present

## 2012-12-07 DIAGNOSIS — M25579 Pain in unspecified ankle and joints of unspecified foot: Secondary | ICD-10-CM | POA: Insufficient documentation

## 2012-12-07 DIAGNOSIS — M25673 Stiffness of unspecified ankle, not elsewhere classified: Secondary | ICD-10-CM | POA: Insufficient documentation

## 2012-12-11 ENCOUNTER — Ambulatory Visit: Payer: Medicare Other | Admitting: Rehabilitative and Restorative Service Providers"

## 2012-12-11 DIAGNOSIS — M25673 Stiffness of unspecified ankle, not elsewhere classified: Secondary | ICD-10-CM | POA: Diagnosis not present

## 2012-12-11 DIAGNOSIS — IMO0001 Reserved for inherently not codable concepts without codable children: Secondary | ICD-10-CM | POA: Diagnosis not present

## 2012-12-11 DIAGNOSIS — M25579 Pain in unspecified ankle and joints of unspecified foot: Secondary | ICD-10-CM | POA: Diagnosis not present

## 2012-12-11 DIAGNOSIS — R5381 Other malaise: Secondary | ICD-10-CM | POA: Diagnosis not present

## 2012-12-14 ENCOUNTER — Ambulatory Visit: Payer: Medicare Other | Admitting: Rehabilitative and Restorative Service Providers"

## 2012-12-14 DIAGNOSIS — R5381 Other malaise: Secondary | ICD-10-CM | POA: Diagnosis not present

## 2012-12-14 DIAGNOSIS — M25676 Stiffness of unspecified foot, not elsewhere classified: Secondary | ICD-10-CM | POA: Diagnosis not present

## 2012-12-14 DIAGNOSIS — IMO0001 Reserved for inherently not codable concepts without codable children: Secondary | ICD-10-CM | POA: Diagnosis not present

## 2012-12-14 DIAGNOSIS — M25579 Pain in unspecified ankle and joints of unspecified foot: Secondary | ICD-10-CM | POA: Diagnosis not present

## 2012-12-18 ENCOUNTER — Ambulatory Visit: Payer: Medicare Other | Admitting: Rehabilitative and Restorative Service Providers"

## 2012-12-18 DIAGNOSIS — R5381 Other malaise: Secondary | ICD-10-CM | POA: Diagnosis not present

## 2012-12-18 DIAGNOSIS — IMO0001 Reserved for inherently not codable concepts without codable children: Secondary | ICD-10-CM | POA: Diagnosis not present

## 2012-12-18 DIAGNOSIS — M25579 Pain in unspecified ankle and joints of unspecified foot: Secondary | ICD-10-CM | POA: Diagnosis not present

## 2012-12-18 DIAGNOSIS — M25676 Stiffness of unspecified foot, not elsewhere classified: Secondary | ICD-10-CM | POA: Diagnosis not present

## 2012-12-22 ENCOUNTER — Ambulatory Visit: Payer: Medicare Other | Admitting: Rehabilitative and Restorative Service Providers"

## 2012-12-22 DIAGNOSIS — M25676 Stiffness of unspecified foot, not elsewhere classified: Secondary | ICD-10-CM | POA: Diagnosis not present

## 2012-12-22 DIAGNOSIS — IMO0001 Reserved for inherently not codable concepts without codable children: Secondary | ICD-10-CM | POA: Diagnosis not present

## 2012-12-22 DIAGNOSIS — M25579 Pain in unspecified ankle and joints of unspecified foot: Secondary | ICD-10-CM | POA: Diagnosis not present

## 2012-12-22 DIAGNOSIS — R5381 Other malaise: Secondary | ICD-10-CM | POA: Diagnosis not present

## 2012-12-25 ENCOUNTER — Ambulatory Visit: Payer: Medicare Other | Admitting: Rehabilitative and Restorative Service Providers"

## 2012-12-25 DIAGNOSIS — M25579 Pain in unspecified ankle and joints of unspecified foot: Secondary | ICD-10-CM | POA: Diagnosis not present

## 2012-12-25 DIAGNOSIS — R5381 Other malaise: Secondary | ICD-10-CM | POA: Diagnosis not present

## 2012-12-25 DIAGNOSIS — IMO0001 Reserved for inherently not codable concepts without codable children: Secondary | ICD-10-CM | POA: Diagnosis not present

## 2012-12-25 DIAGNOSIS — M25676 Stiffness of unspecified foot, not elsewhere classified: Secondary | ICD-10-CM | POA: Diagnosis not present

## 2012-12-28 ENCOUNTER — Ambulatory Visit: Payer: Medicare Other | Admitting: Rehabilitative and Restorative Service Providers"

## 2012-12-31 DIAGNOSIS — L738 Other specified follicular disorders: Secondary | ICD-10-CM | POA: Diagnosis not present

## 2012-12-31 DIAGNOSIS — L570 Actinic keratosis: Secondary | ICD-10-CM | POA: Diagnosis not present

## 2013-01-01 ENCOUNTER — Ambulatory Visit: Payer: Medicare Other | Admitting: Rehabilitative and Restorative Service Providers"

## 2013-01-01 DIAGNOSIS — H02409 Unspecified ptosis of unspecified eyelid: Secondary | ICD-10-CM | POA: Diagnosis not present

## 2013-01-15 ENCOUNTER — Ambulatory Visit: Payer: Medicare Other | Attending: Podiatry | Admitting: Rehabilitative and Restorative Service Providers"

## 2013-01-15 DIAGNOSIS — M25579 Pain in unspecified ankle and joints of unspecified foot: Secondary | ICD-10-CM | POA: Diagnosis not present

## 2013-01-15 DIAGNOSIS — M25673 Stiffness of unspecified ankle, not elsewhere classified: Secondary | ICD-10-CM | POA: Diagnosis not present

## 2013-01-15 DIAGNOSIS — IMO0001 Reserved for inherently not codable concepts without codable children: Secondary | ICD-10-CM | POA: Insufficient documentation

## 2013-01-15 DIAGNOSIS — R5381 Other malaise: Secondary | ICD-10-CM | POA: Insufficient documentation

## 2013-01-15 DIAGNOSIS — M25676 Stiffness of unspecified foot, not elsewhere classified: Secondary | ICD-10-CM | POA: Insufficient documentation

## 2013-01-25 DIAGNOSIS — H02409 Unspecified ptosis of unspecified eyelid: Secondary | ICD-10-CM | POA: Diagnosis not present

## 2013-02-10 DIAGNOSIS — B351 Tinea unguium: Secondary | ICD-10-CM | POA: Diagnosis not present

## 2013-02-10 DIAGNOSIS — M79609 Pain in unspecified limb: Secondary | ICD-10-CM | POA: Diagnosis not present

## 2013-03-09 ENCOUNTER — Other Ambulatory Visit: Payer: Self-pay | Admitting: Internal Medicine

## 2013-04-30 ENCOUNTER — Encounter: Payer: Self-pay | Admitting: Internal Medicine

## 2013-04-30 ENCOUNTER — Ambulatory Visit (INDEPENDENT_AMBULATORY_CARE_PROVIDER_SITE_OTHER): Payer: Medicare Other | Admitting: Internal Medicine

## 2013-04-30 VITALS — BP 150/90 | HR 74 | Temp 97.7°F | Resp 20 | Wt 183.0 lb

## 2013-04-30 DIAGNOSIS — I10 Essential (primary) hypertension: Secondary | ICD-10-CM | POA: Diagnosis not present

## 2013-04-30 DIAGNOSIS — Z23 Encounter for immunization: Secondary | ICD-10-CM | POA: Diagnosis not present

## 2013-04-30 DIAGNOSIS — E785 Hyperlipidemia, unspecified: Secondary | ICD-10-CM | POA: Diagnosis not present

## 2013-04-30 DIAGNOSIS — M1990 Unspecified osteoarthritis, unspecified site: Secondary | ICD-10-CM

## 2013-04-30 MED ORDER — TRAZODONE HCL 50 MG PO TABS: 25.0000 mg | ORAL_TABLET | Freq: Every evening | ORAL | Status: DC | PRN

## 2013-04-30 NOTE — Patient Instructions (Signed)
Limit your sodium (Salt) intake  Return in 6 months for follow-up  

## 2013-04-30 NOTE — Progress Notes (Signed)
Subjective:    Patient ID: Shelley Robinson, female    DOB: Jun 21, 1927, 79 y.o.   MRN: [id number]  HPI  79 year old patient who is seen today for her six-month followup. She has a history of dyslipidemia and has been on Crestor. Recently she has self discontinued due to proceed side effects and over the past couple weeks has resumed at a lower dose. At the present time she seems to be tolerating this medication well She complains of some recent onset insomnia She has treated hypertension as well as a history of osteoarthritis. In general doing quite well. No new concerns or complaints She was seen 6 months ago for her annual physical  Wt Readings from Last 3 Encounters:  04/30/13 183 lb (83.008 kg)  10/29/12 190 lb (86.183 kg)  09/17/12 198 lb (89.812 kg)    Past Medical History  Diagnosis Date  . Hypertension   . GERD (gastroesophageal reflux disease)   . Arthritis     History   Social History  . Marital Status: Widowed    Spouse Name: N/A    Number of Children: N/A  . Years of Education: N/A   Occupational History  . Not on file.   Social History Main Topics  . Smoking status: Former Smoker    Quit date: 07/08/1978  . Smokeless tobacco: Not on file  . Alcohol Use: 0.6 oz/week    1 Glasses of wine per week     Comment: once or twice a month  . Drug Use: Not on file  . Sexual Activity: Not on file   Other Topics Concern  . Not on file   Social History Narrative  . No narrative on file    Past Surgical History  Procedure Laterality Date  . Cholecystectomy    . Abdominal hysterectomy    . Joint replacement    . Tonsillectomy      No family history on file.  Allergies  Allergen Reactions  . Lisinopril Cough  . Piroxicam Other (See Comments)    unknown  . Prednisone Other (See Comments)    hypertensive  . Penicillins Rash    Current Outpatient Prescriptions on File Prior to Visit  Medication Sig Dispense Refill  . acetaminophen (TYLENOL) 500 MG  tablet Take 1 tablet (500 mg total) by mouth every 6 (six) hours as needed for pain. Take one  in AM and one at qhs, and one at noon if needed  30 tablet  0  . aspirin 81 MG EC tablet Take 81 mg by mouth daily.       . calcium-vitamin D (OSCAL WITH D 500-200) 500-200 MG-UNIT per tablet Take 1 tablet by mouth daily.       . diphenhydramine-acetaminophen (TYLENOL PM) 25-500 MG TABS Take 1 tablet by mouth at bedtime as needed. For sleep      . fish oil-omega-3 fatty acids 1000 MG capsule Take 1 g by mouth daily.        . hydrochlorothiazide (HYDRODIURIL) 25 MG tablet TAKE ONE TABLET BY MOUTH DAILY.  90 tablet  3  . hydroxypropyl methylcellulose (ISOPTO TEARS) 2.5 % ophthalmic solution Place 1 drop into both eyes daily as needed.      . Multiple Vitamin (MULTIVITAMIN) capsule Take 1 capsule by mouth daily.       . mupirocin (BACTROBAN) 2 % ointment Apply 1 application topically 2 (two) times daily as needed.       Marland Kitchen omeprazole (PRILOSEC) 20 MG capsule TAKE ONE CAPSULE (  20 MG TOTAL) BY MOUTH TWO TIMES DAILY.  180 capsule  3  . rosuvastatin (CRESTOR) 5 MG tablet Take 5 mg by mouth at bedtime.       No current facility-administered medications on file prior to visit.    BP 150/90  Pulse 74  Temp(Src) 97.7 F (36.5 C) (Oral)  Resp 20  Wt 183 lb (83.008 kg)  BMI 34.86 kg/m2  SpO2 95%     Review of Systems  Constitutional: Negative.   HENT: Negative for congestion, dental problem, hearing loss, rhinorrhea, sinus pressure, sore throat and tinnitus.   Eyes: Negative for pain, discharge and visual disturbance.  Respiratory: Negative for cough and shortness of breath.   Cardiovascular: Negative for chest pain, palpitations and leg swelling.  Gastrointestinal: Negative for nausea, vomiting, abdominal pain, diarrhea, constipation, blood in stool and abdominal distention.  Genitourinary: Negative for dysuria, urgency, frequency, hematuria, flank pain, vaginal bleeding, vaginal discharge, difficulty  urinating, vaginal pain and pelvic pain.  Musculoskeletal: Positive for arthralgias, back pain and myalgias. Negative for gait problem and joint swelling.  Skin: Negative for rash.  Neurological: Negative for dizziness, syncope, speech difficulty, weakness, numbness and headaches.  Hematological: Negative for adenopathy.  Psychiatric/Behavioral: Positive for sleep disturbance. Negative for behavioral problems, dysphoric mood and agitation. The patient is not nervous/anxious.        Objective:   Physical Exam  Constitutional: She is oriented to person, place, and time. She appears well-developed and well-nourished.  HENT:  Head: Normocephalic.  Right Ear: External ear normal.  Left Ear: External ear normal.  Mouth/Throat: Oropharynx is clear and moist.  Eyes: Conjunctivae and EOM are normal. Pupils are equal, round, and reactive to light.  Neck: Normal range of motion. Neck supple. No thyromegaly present.  Cardiovascular: Normal rate, regular rhythm, normal heart sounds and intact distal pulses.   Pulmonary/Chest: Effort normal and breath sounds normal.  Abdominal: Soft. Bowel sounds are normal. She exhibits no mass. There is no tenderness.  Musculoskeletal: Normal range of motion.  Lymphadenopathy:    She has no cervical adenopathy.  Neurological: She is alert and oriented to person, place, and time.  Skin: Skin is warm and dry. No rash noted.  Psychiatric: She has a normal mood and affect. Her behavior is normal.          Assessment & Plan:   Dyslipidemia. The patient resume Crestor and since the time of this medication well we'll continue Hypertension well controlled. Blood pressure 130/80 Insomnia. Will treat with short term trazodone Osteoarthritis   CPX 6 months

## 2013-05-19 ENCOUNTER — Encounter: Payer: Self-pay | Admitting: Podiatrist

## 2013-05-19 ENCOUNTER — Ambulatory Visit (INDEPENDENT_AMBULATORY_CARE_PROVIDER_SITE_OTHER): Payer: Medicare Other | Admitting: Podiatrist

## 2013-05-19 VITALS — BP 166/83 | HR 67 | Resp 16

## 2013-05-19 DIAGNOSIS — B351 Tinea unguium: Secondary | ICD-10-CM

## 2013-05-19 DIAGNOSIS — L923 Foreign body granuloma of the skin and subcutaneous tissue: Secondary | ICD-10-CM | POA: Diagnosis not present

## 2013-05-19 DIAGNOSIS — M79609 Pain in unspecified limb: Secondary | ICD-10-CM

## 2013-05-19 NOTE — Progress Notes (Signed)
HPI:  Patient presents today for follow up of foot and nail care. Denies any new complaints today.  Objective:  Patients chart is reviewed.  Neurovascular status unchanged.  Patients nails are thickened, discolored, distrophic, friable and brittle with yellow-brown discoloration. Patient subjectively relates they are painful with shoes and with ambulation.both feet.  There is also a soft tissue lesion on the dorsal lateral aspect of the right foot with a central hyperkeratotic core noted. It's painful with shoe gear and with pressure.  Assessment:  Symptomatic onychomycosis, soft tissue lesion of skin  Plan:  Discussed treatment options and alternatives.  The symptomatic toenails were debrided through manual an mechanical means without complication.  Anesthetized the soft tissue lesion with lidocaine Marcaine plain and excised and the central core of the lesion. Applied antibiotic ointment and a Band-Aid and gave her instructions for aftercare. If this does not help her with the problem we will consider an excision at the next visit. Return appointment recommended at routine intervals of 3 months

## 2013-05-19 NOTE — Patient Instructions (Signed)
Call if you  Have any questions or concerns

## 2013-06-09 ENCOUNTER — Other Ambulatory Visit: Payer: Self-pay | Admitting: *Deleted

## 2013-06-09 MED ORDER — ROSUVASTATIN CALCIUM 5 MG PO TABS: 5.0000 mg | ORAL_TABLET | Freq: Every day | ORAL | Status: DC

## 2013-06-17 ENCOUNTER — Ambulatory Visit (INDEPENDENT_AMBULATORY_CARE_PROVIDER_SITE_OTHER): Payer: Medicare Other | Admitting: Internal Medicine

## 2013-06-17 ENCOUNTER — Encounter: Payer: Self-pay | Admitting: Internal Medicine

## 2013-06-17 VITALS — BP 140/80 | HR 84 | Temp 98.4°F | Resp 20 | Wt 180.0 lb

## 2013-06-17 DIAGNOSIS — E785 Hyperlipidemia, unspecified: Secondary | ICD-10-CM | POA: Diagnosis not present

## 2013-06-17 DIAGNOSIS — I10 Essential (primary) hypertension: Secondary | ICD-10-CM | POA: Diagnosis not present

## 2013-06-17 DIAGNOSIS — N952 Postmenopausal atrophic vaginitis: Secondary | ICD-10-CM

## 2013-06-17 MED ORDER — ESTROGENS, CONJUGATED 0.625 MG/GM VA CREA: 1.0000 | TOPICAL_CREAM | Freq: Every day | VAGINAL | Status: DC

## 2013-06-17 NOTE — Progress Notes (Signed)
Subjective:    Patient ID: Shelley Robinson, female    DOB: [birth year], 79 y.o.   MRN: [id number]  HPI 79 year old patient who has a history of hypertension dyslipidemia and diastolic dysfunction. She presents with a chief complaint of a slight bulge in the vaginal opening. There is mild vaginal discomfort. No vaginal bleeding. No change in her bowel habits or rectal bleeding. Her hypertension has been stable. She remains on Crestor for dyslipidemia  Past Medical History  Diagnosis Date  . Hypertension   . GERD (gastroesophageal reflux disease)   . Arthritis     History   Social History  . Marital Status: Widowed    Spouse Name: N/A    Number of Children: N/A  . Years of Education: N/A   Occupational History  . Not on file.   Social History Main Topics  . Smoking status: Former Smoker    Quit date: 07/08/1978  . Smokeless tobacco: Not on file  . Alcohol Use: 0.6 oz/week    1 Glasses of wine per week     Comment: once or twice a month  . Drug Use: Not on file  . Sexual Activity: Not on file   Other Topics Concern  . Not on file   Social History Narrative  . No narrative on file    Past Surgical History  Procedure Laterality Date  . Cholecystectomy    . Abdominal hysterectomy    . Joint replacement    . Tonsillectomy      No family history on file.  Allergies  Allergen Reactions  . Lisinopril Cough  . Piroxicam Other (See Comments)    unknown  . Prednisone Other (See Comments)    hypertensive  . Penicillins Rash    Current Outpatient Prescriptions on File Prior to Visit  Medication Sig Dispense Refill  . acetaminophen (TYLENOL) 500 MG tablet Take 1 tablet (500 mg total) by mouth every 6 (six) hours as needed for pain. Take one  in AM and one at qhs, and one at noon if needed  30 tablet  0  . aspirin 81 MG EC tablet Take 81 mg by mouth daily.       . calcium-vitamin D (OSCAL WITH D 500-200) 500-200 MG-UNIT per tablet Take 1 tablet by mouth daily.        . diphenhydramine-acetaminophen (TYLENOL PM) 25-500 MG TABS Take 1 tablet by mouth at bedtime as needed. For sleep      . fish oil-omega-3 fatty acids 1000 MG capsule Take 1 g by mouth daily.        . hydrochlorothiazide (HYDRODIURIL) 25 MG tablet TAKE ONE TABLET BY MOUTH DAILY.  90 tablet  3  . hydroxypropyl methylcellulose (ISOPTO TEARS) 2.5 % ophthalmic solution Place 1 drop into both eyes daily as needed.      . Multiple Vitamin (MULTIVITAMIN) capsule Take 1 capsule by mouth daily.       . Multiple Vitamins-Minerals (PRESERVISION AREDS 2 PO) Take 1 tablet by mouth daily.      . mupirocin (BACTROBAN) 2 % ointment Apply 1 application topically 2 (two) times daily as needed.       Marland Kitchen omeprazole (PRILOSEC) 20 MG capsule TAKE ONE CAPSULE (20 MG TOTAL) BY MOUTH TWO TIMES DAILY.  180 capsule  3  . rosuvastatin (CRESTOR) 5 MG tablet Take 1 tablet (5 mg total) by mouth at bedtime.  90 tablet  3  . traZODone (DESYREL) 50 MG tablet Take 0.5-1 tablets (25-50 mg total)  by mouth at bedtime as needed for sleep.  30 tablet  3   No current facility-administered medications on file prior to visit.    BP 140/80  Pulse 84  Temp(Src) 98.4 F (36.9 C) (Oral)  Resp 20  Wt 180 lb (81.647 kg)  SpO2 97%       Review of Systems  Constitutional: Negative.   HENT: Negative for congestion, dental problem, hearing loss, rhinorrhea, sinus pressure, sore throat and tinnitus.   Eyes: Negative for pain, discharge and visual disturbance.  Respiratory: Negative for cough and shortness of breath.   Cardiovascular: Negative for chest pain, palpitations and leg swelling.  Gastrointestinal: Negative for nausea, vomiting, abdominal pain, diarrhea, constipation, blood in stool and abdominal distention.  Genitourinary: Positive for vaginal pain and pelvic pain. Negative for dysuria, urgency, frequency, hematuria, flank pain, vaginal bleeding, vaginal discharge and difficulty urinating.  Musculoskeletal: Negative for  arthralgias, gait problem and joint swelling.  Skin: Negative for rash.  Neurological: Negative for dizziness, syncope, speech difficulty, weakness, numbness and headaches.  Hematological: Negative for adenopathy.  Psychiatric/Behavioral: Negative for behavioral problems, dysphoric mood and agitation. The patient is not nervous/anxious.        Objective:   Physical Exam  Constitutional: She appears well-developed and well-nourished. No distress.  Abdominal: Soft. Bowel sounds are normal. She exhibits no distension and no mass. There is no tenderness. There is no rebound and no guarding.  Genitourinary: Guaiac negative stool. No vaginal discharge found.  Vaginal atrophy Possible mild cystocele Status post hysterectomy          Assessment & Plan:   Probable atrophic vaginitis Mild uterine prolapse Hypertension stable Dyslipidemia. Continue Crestor  We'll treat with Premarin vaginal cream daily for a couple weeks and then twice weekly. We'll call if unimproved. May require gynecologic referral

## 2013-06-17 NOTE — Progress Notes (Signed)
Pre-visit discussion using our clinic review tool. No additional management support is needed unless otherwise documented below in the visit note.  

## 2013-06-17 NOTE — Patient Instructions (Signed)
Call or return to clinic prn if these symptoms worsen or fail to improve as anticipated. Atrophic Vaginitis Atrophic vaginitis is a problem of low levels of estrogen in women. This problem can happen at any age. It is most common in women who have gone through menopause ("the change").  HOW WILL I KNOW IF I HAVE THIS PROBLEM? You may have:  Trouble with peeing (urinating), such as:  Going to the bathroom often.  A hard time holding your pee until you reach a bathroom.  Leaking pee.  Having pain when you pee.  Itching or a burning feeling.  Vaginal bleeding and spotting.  Pain during sex.  Dryness of the vagina.  A yellow, bad-smelling fluid (discharge) coming from the vagina. HOW WILL MY DOCTOR CHECK FOR THIS PROBLEM?  During your exam, your doctor will likely find the problem.  If there is a vaginal fluid, it may be checked for infection. HOW WILL THIS PROBLEM BE TREATED? Keep the vulvar skin as clean as possible. Moisturizers and lubricants can help with some of the symptoms. Estrogen replacement can help. There are 2 ways to take estrogen:  Systemic estrogen gets estrogen to your whole body. It takes many weeks or months before the symptoms get better.  You take an estrogen pill.  You use a skin patch. This is a patch that you put on your skin.  If you still have your uterus, your doctor may ask you to take a hormone. Talk to your doctor about the right medicine for you.  Estrogen cream.  This puts estrogen only at the part of your body where you apply it. The cream is put into the vagina or put on the vulvar skin. For some women, estrogen cream works faster than pills or the patch. CAN ALL WOMEN WITH THIS PROBLEM USE ESTROGEN? No. Women with certain types of cancer, liver problems, or problems with blood clots should not take estrogen. Your doctor can help you decide the best treatment for your symptoms. Document Released: 12/11/2007 Document Revised: 09/16/2011  Document Reviewed: 12/11/2007 [facility] Patient Information 2014 [place], [place].

## 2013-06-25 DIAGNOSIS — L570 Actinic keratosis: Secondary | ICD-10-CM | POA: Diagnosis not present

## 2013-06-25 DIAGNOSIS — D485 Neoplasm of uncertain behavior of skin: Secondary | ICD-10-CM | POA: Diagnosis not present

## 2013-08-06 DIAGNOSIS — L259 Unspecified contact dermatitis, unspecified cause: Secondary | ICD-10-CM | POA: Diagnosis not present

## 2013-08-25 ENCOUNTER — Ambulatory Visit: Payer: 59 | Admitting: Podiatrist

## 2013-09-01 ENCOUNTER — Other Ambulatory Visit: Payer: Self-pay | Admitting: Internal Medicine

## 2013-09-02 ENCOUNTER — Ambulatory Visit: Payer: 59 | Admitting: Podiatrist

## 2013-09-10 ENCOUNTER — Other Ambulatory Visit: Payer: Self-pay

## 2013-09-10 DIAGNOSIS — Z1231 Encounter for screening mammogram for malignant neoplasm of breast: Secondary | ICD-10-CM

## 2013-09-23 ENCOUNTER — Ambulatory Visit (INDEPENDENT_AMBULATORY_CARE_PROVIDER_SITE_OTHER): Payer: Medicare Other | Admitting: Podiatrist

## 2013-09-23 ENCOUNTER — Encounter: Payer: Self-pay | Admitting: Podiatrist

## 2013-09-23 VITALS — BP 166/82 | HR 76 | Resp 18

## 2013-09-23 DIAGNOSIS — M79609 Pain in unspecified limb: Secondary | ICD-10-CM

## 2013-09-23 DIAGNOSIS — B351 Tinea unguium: Secondary | ICD-10-CM | POA: Diagnosis not present

## 2013-09-23 NOTE — Progress Notes (Signed)
I am to get my toenails trimmed up  HPI:  Patient presents today for follow up of foot and nail care. Denies any new complaints today.  Objective:  Patients chart is reviewed.  Vascular status reveals pedal pulses noted at 2 out of 4 dp and pt bilateral .  Neurological sensation is Normal to [employer] monofilament bilateral.  Patients nails are thickened, discolored, distrophic, friable and brittle with yellow-brown discoloration. Patient subjectively relates they are painful with shoes and with ambulation of bilateral feet.  Assessment:  Symptomatic onychomycosis  Plan:  Discussed treatment options and alternatives.  The symptomatic toenails were debrided through manual an mechanical means without complication.  Return appointment recommended at routine intervals of 3 months    Trudie Buckler, DPM

## 2013-09-28 ENCOUNTER — Telehealth: Payer: Self-pay | Admitting: Internal Medicine

## 2013-09-28 NOTE — Telephone Encounter (Signed)
[facility] requesting refill of mupirocin (BACTROBAN) 2 % ointment

## 2013-09-29 MED ORDER — MUPIROCIN 2 % EX OINT: 1.0000 "application " | TOPICAL_OINTMENT | Freq: Two times a day (BID) | CUTANEOUS | Status: DC | PRN

## 2013-09-29 NOTE — Telephone Encounter (Signed)
Rx sent 

## 2013-10-12 ENCOUNTER — Ambulatory Visit: Payer: Medicare Other

## 2013-10-15 ENCOUNTER — Ambulatory Visit
Admission: RE | Admit: 2013-10-15 | Discharge: 2013-10-15 | Disposition: A | Discharge status: Home / Self Care | Payer: Medicare Other | Source: Ambulatory Visit

## 2013-10-15 ENCOUNTER — Ambulatory Visit: Payer: Medicare Other

## 2013-10-15 DIAGNOSIS — Z1231 Encounter for screening mammogram for malignant neoplasm of breast: Secondary | ICD-10-CM | POA: Diagnosis not present

## 2013-11-02 ENCOUNTER — Ambulatory Visit (INDEPENDENT_AMBULATORY_CARE_PROVIDER_SITE_OTHER): Payer: Medicare Other | Admitting: Internal Medicine

## 2013-11-02 ENCOUNTER — Encounter: Payer: Self-pay | Admitting: Internal Medicine

## 2013-11-02 VITALS — BP 136/90 | HR 75 | Temp 98.1°F | Resp 20 | Ht 61.0 in | Wt 187.0 lb

## 2013-11-02 DIAGNOSIS — I10 Essential (primary) hypertension: Secondary | ICD-10-CM | POA: Diagnosis not present

## 2013-11-02 DIAGNOSIS — Z8679 Personal history of other diseases of the circulatory system: Secondary | ICD-10-CM

## 2013-11-02 DIAGNOSIS — E785 Hyperlipidemia, unspecified: Secondary | ICD-10-CM

## 2013-11-02 DIAGNOSIS — Z0000 Encounter for general adult medical examination without abnormal findings: Secondary | ICD-10-CM | POA: Diagnosis not present

## 2013-11-02 DIAGNOSIS — Z23 Encounter for immunization: Secondary | ICD-10-CM | POA: Diagnosis not present

## 2013-11-02 DIAGNOSIS — M1990 Unspecified osteoarthritis, unspecified site: Secondary | ICD-10-CM | POA: Diagnosis not present

## 2013-11-02 LAB — CBC WITH DIFFERENTIAL/PLATELET
BASOS PCT: 0.2 % (ref 0.0–3.0)
Basophils Absolute: 0 10*3/uL (ref 0.0–0.1)
EOS ABS: 0.1 10*3/uL (ref 0.0–0.7)
EOS PCT: 1.7 % (ref 0.0–5.0)
HCT: 44.7 % (ref 36.0–46.0)
HEMOGLOBIN: 15 g/dL (ref 12.0–15.0)
LYMPHS PCT: 29.2 % (ref 12.0–46.0)
Lymphs Abs: 1.4 10*3/uL (ref 0.7–4.0)
MCHC: 33.5 g/dL (ref 30.0–36.0)
MCV: 97.1 fl (ref 78.0–100.0)
MONOS PCT: 8.8 % (ref 3.0–12.0)
Monocytes Absolute: 0.4 10*3/uL (ref 0.1–1.0)
NEUTROS ABS: 2.8 10*3/uL (ref 1.4–7.7)
Neutrophils Relative %: 60.1 % (ref 43.0–77.0)
Platelets: 186 10*3/uL (ref 150.0–400.0)
RBC: 4.61 Mil/uL (ref 3.87–5.11)
RDW: 13.2 % (ref 11.5–14.6)
WBC: 4.6 10*3/uL (ref 4.5–10.5)

## 2013-11-02 LAB — TSH: TSH: 1.09 u[IU]/mL (ref 0.35–5.50)

## 2013-11-02 LAB — COMPREHENSIVE METABOLIC PANEL
ALK PHOS: 88 U/L (ref 39–117)
ALT: 19 U/L (ref 0–35)
AST: 26 U/L (ref 0–37)
Albumin: 3.9 g/dL (ref 3.5–5.2)
BUN: 20 mg/dL (ref 6–23)
CO2: 31 mEq/L (ref 19–32)
CREATININE: 1 mg/dL (ref 0.4–1.2)
Calcium: 9.5 mg/dL (ref 8.4–10.5)
Chloride: 102 mEq/L (ref 96–112)
GFR: 56.67 mL/min — ABNORMAL LOW (ref >60.00)
GLUCOSE: 88 mg/dL (ref 70–99)
Potassium: 4.4 mEq/L (ref 3.5–5.1)
Sodium: 141 mEq/L (ref 135–145)
Total Bilirubin: 0.7 mg/dL (ref 0.3–1.2)
Total Protein: 7 g/dL (ref 6.0–8.3)

## 2013-11-02 MED ORDER — ROSUVASTATIN CALCIUM 10 MG PO TABS: 10.0000 mg | ORAL_TABLET | Freq: Every day | ORAL | Status: DC

## 2013-11-02 NOTE — Addendum Note (Signed)
Addended by: Marian Sorrow on: 11/02/2013 12:13 PM   Modules accepted: Orders

## 2013-11-02 NOTE — Progress Notes (Signed)
Patient ID: Shelley Robinson, female   DOB: 06-27-1927, 80 y.o.   MRN: [id number]  Subjective:    Patient ID: Shelley Robinson, female    DOB: 16-Jul-1926, 80 y.o.   MRN: [id number]  HPI  CC: cpx - doing ok.   History of Present Illness:   Wt Readings from Last 3 Encounters:  06/17/13 180 lb (81.647 kg)  04/30/13 183 lb (83.008 kg)  10/29/12 190 lb (86.88 kg)    80  year-old patient who is seen today for a comprehensive evaluation.  She fractured her left humerus in October of 2010 and had surgery at [facility]. She states that she had a 2-D echocardiogram at that time. She has long a long history of dyspnea on exertion. She has treated hypertension, dyslipidemia, and significant osteoarthritis. She has a history of exogenous obesity.  Problems include hypertension, dyslipidemia, and osteoarthritis  Here for Medicare AWV:   1. Risk factors based on Past M, S, F history: risk factors include hypertension, and dyslipidemia.  2. Physical Activities: very sedentary due to obesity, arthritis, and dyspnea on exertion  3. Depression/mood: history depression, or mood disorder  4. Hearing: no deficits  5. ADL's: independent in all aspects of daily living, but sedentary  6. Fall Risk: moderate due to age, obesity, arthritis  7. Home Safety: no problems identified  8. Height, weight, &visual acuity: there continues to be modest weight gain. No difficulty with visual acuity  9. Counseling: weight loss encouraged  10. Labs ordered based on risk factors: laboratory profile, including lipid panel will be reviewed  11. Referral Coordination-  N/A 12. Care Plan- weight loss, heart healthy diet or exercise. All encouraged  13. Cognitive Assessment- alert and oriented, with a normal affect. She feels that she may have become slightly more forgetful. 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 No family concerns with memory   Preventive Screening-Counseling & Management   Alcohol-Tobacco  Smoking Status: quit   Allergies:  1) ! Penicillin G Pot in Dextrose (Penicillin G Potassium in D5w)  2) ! Feldene   Past History:  Past Medical History:   Hyperlipidemia  Hypertension  DJD  Low back pain  Knee pain  exogenous obesity  Osteoarthritis  mild hypoxemia   Past Surgical History:   Appendectomy  Cholecystectomy  Hysterectomy  Tonsillectomy  cataract surgery OD  2 D Echo 10-11 ([facility])  colonoscopy none- prior sigmoidoscopy 2003  surgery for fracture, left humerus September 2010   Family History:    Father ded age 44: cerebrovascular disease CAD, prostate cancer  mother died at age 8, senile dementia  one sister died of a lymphoma   Social History:   3 children  [place]-October 2006    Review of Systems  Constitutional: Negative for fever, appetite change, fatigue and unexpected weight change.  HENT: Negative for congestion, dental problem, ear pain, hearing loss, mouth sores, nosebleeds, sinus pressure, sore throat, tinnitus, trouble swallowing and voice change.   Eyes: Negative for photophobia, pain, redness and visual disturbance.  Respiratory: Negative for cough, chest tightness and shortness of breath.   Cardiovascular: Negative for chest pain, palpitations and leg swelling.  Gastrointestinal: Negative for nausea, vomiting, abdominal pain, diarrhea, constipation, blood in stool, abdominal distention and rectal pain.  Genitourinary: Negative for dysuria, urgency, frequency, hematuria, flank pain, vaginal bleeding, vaginal discharge, difficulty urinating, genital sores, vaginal pain, menstrual problem and pelvic pain.  Musculoskeletal: Positive for back pain and gait problem. Negative for arthralgias and neck stiffness.  Skin:  Negative for rash.  Neurological: Negative for dizziness, syncope, speech difficulty, weakness, light-headedness, numbness and headaches.  Hematological: Negative for adenopathy. Does not bruise/bleed  easily.  Psychiatric/Behavioral: Negative for suicidal ideas, behavioral problems, self-injury, dysphoric mood and agitation. The patient is not nervous/anxious.        Objective:   Physical Exam  Constitutional: She is oriented to person, place, and time. She appears well-developed and well-nourished.  Repeat blood pressure 130/86  HENT:  Head: Normocephalic and atraumatic.  Right Ear: External ear normal.  Left Ear: External ear normal.  Mouth/Throat: Oropharynx is clear and moist.  Dentures in place  Pharyngeal crowding  Eyes: Conjunctivae and EOM are normal.  Neck: Normal range of motion. Neck supple. No JVD present. No thyromegaly present.  Cardiovascular: Normal rate, regular rhythm and normal heart sounds.   No murmur heard. Pedal pulses full, except for an absent right dorsalis pedis pulse  Pulmonary/Chest: Effort normal and breath sounds normal. She has no wheezes. She has no rales.  Abdominal: Soft. Bowel sounds are normal. She exhibits no distension and no mass. There is no tenderness. There is no rebound and no guarding.  Genitourinary: Vagina normal.  Musculoskeletal: Normal range of motion. She exhibits edema. She exhibits no tenderness.  Trace lower extremity edema  Neurological: She is alert and oriented to person, place, and time. She has normal reflexes. No cranial nerve deficit. She exhibits normal muscle tone. Coordination normal.  Skin: Skin is warm and dry. No rash noted.  Dry flaky skin of the feet especially laterally Onychomycotic toes  Psychiatric: She has a normal mood and affect. Her behavior is normal.          Assessment & Plan:   Preventive health examination Hypertension stable Exogenous obesity. Weight loss encouraged  Dyslipidemia. We'll check a lipid panel continue Crestor Osteoarthritis with chronic low back pain   Recheck 6 months

## 2013-11-02 NOTE — Progress Notes (Signed)
Pre-visit discussion using our clinic review tool. No additional management support is needed unless otherwise documented below in the visit note.  

## 2013-11-02 NOTE — Patient Instructions (Signed)

## 2013-12-23 ENCOUNTER — Ambulatory Visit: Payer: 59 | Admitting: Podiatrist

## 2013-12-24 ENCOUNTER — Ambulatory Visit (INDEPENDENT_AMBULATORY_CARE_PROVIDER_SITE_OTHER): Payer: Medicare Other | Admitting: Podiatrist

## 2013-12-24 DIAGNOSIS — M79609 Pain in unspecified limb: Secondary | ICD-10-CM

## 2013-12-24 DIAGNOSIS — B351 Tinea unguium: Secondary | ICD-10-CM | POA: Diagnosis not present

## 2013-12-24 DIAGNOSIS — M79606 Pain in leg, unspecified: Secondary | ICD-10-CM

## 2013-12-28 DIAGNOSIS — H5210 Myopia, unspecified eye: Secondary | ICD-10-CM | POA: Diagnosis not present

## 2013-12-28 DIAGNOSIS — H3530 Unspecified macular degeneration: Secondary | ICD-10-CM | POA: Diagnosis not present

## 2013-12-28 DIAGNOSIS — Z961 Presence of intraocular lens: Secondary | ICD-10-CM | POA: Diagnosis not present

## 2013-12-28 NOTE — Progress Notes (Signed)
HPI:  Patient presents today for follow up of foot and nail care. Denies any new complaints today.  Objective:  Patients chart is reviewed.  Vascular status reveals pedal pulses noted at 2 out of 4 dp and pt bilateral .  Neurological sensation is Normal to Semmes Weinstein monofilament bilateral.  Patients nails are thickened, discolored, distrophic, friable and brittle with yellow-brown discoloration. Patient subjectively relates they are painful with shoes and with ambulation of bilateral feet.  Assessment:  Symptomatic onychomycosis  Plan:  Discussed treatment options and alternatives.  The symptomatic toenails were debrided through manual an mechanical means without complication.  Return appointment recommended at routine intervals of 3 months    Kathryn Egerton, DPM   

## 2014-03-11 ENCOUNTER — Other Ambulatory Visit: Payer: Self-pay | Admitting: Internal Medicine

## 2014-04-01 ENCOUNTER — Ambulatory Visit: Payer: Medicare Other | Admitting: Podiatrist

## 2014-04-13 ENCOUNTER — Ambulatory Visit (INDEPENDENT_AMBULATORY_CARE_PROVIDER_SITE_OTHER): Payer: Medicare Other

## 2014-04-13 DIAGNOSIS — Z23 Encounter for immunization: Secondary | ICD-10-CM | POA: Diagnosis not present

## 2014-04-15 ENCOUNTER — Ambulatory Visit (INDEPENDENT_AMBULATORY_CARE_PROVIDER_SITE_OTHER): Payer: Medicare Other | Admitting: Podiatrist

## 2014-04-15 ENCOUNTER — Encounter: Payer: Self-pay | Admitting: Podiatrist

## 2014-04-15 DIAGNOSIS — B351 Tinea unguium: Secondary | ICD-10-CM | POA: Diagnosis not present

## 2014-04-15 DIAGNOSIS — M79606 Pain in leg, unspecified: Secondary | ICD-10-CM | POA: Diagnosis not present

## 2014-04-19 NOTE — Progress Notes (Signed)
HPI: Patient presents today for follow up of foot and nail care. Denies any new complaints today.  Objective: Patients chart is reviewed. Vascular status reveals pedal pulses noted at 2 out of 4 dp and pt bilateral . Neurological sensation is Normal to Semmes Weinstein monofilament bilateral. Patients nails are thickened, discolored, distrophic, friable and brittle with yellow-brown discoloration. Patient subjectively relates they are painful with shoes and with ambulation of bilateral feet.  Assessment: Symptomatic onychomycosis  Plan: Discussed treatment options and alternatives. The symptomatic toenails were debrided through manual an mechanical means without complication. Return appointment recommended at routine intervals of 3 months   

## 2014-05-06 ENCOUNTER — Ambulatory Visit (INDEPENDENT_AMBULATORY_CARE_PROVIDER_SITE_OTHER): Payer: Medicare Other | Admitting: Internal Medicine

## 2014-05-06 ENCOUNTER — Encounter: Payer: Self-pay | Admitting: Internal Medicine

## 2014-05-06 VITALS — BP 138/90 | HR 93 | Temp 98.0°F | Resp 20 | Ht 61.0 in | Wt 188.0 lb

## 2014-05-06 DIAGNOSIS — M545 Low back pain, unspecified: Secondary | ICD-10-CM

## 2014-05-06 DIAGNOSIS — M159 Polyosteoarthritis, unspecified: Secondary | ICD-10-CM

## 2014-05-06 DIAGNOSIS — I10 Essential (primary) hypertension: Secondary | ICD-10-CM | POA: Diagnosis not present

## 2014-05-06 DIAGNOSIS — E785 Hyperlipidemia, unspecified: Secondary | ICD-10-CM | POA: Diagnosis not present

## 2014-05-06 DIAGNOSIS — M150 Primary generalized (osteo)arthritis: Secondary | ICD-10-CM

## 2014-05-06 NOTE — Patient Instructions (Signed)
Limit your sodium (Salt) intake  You need to lose weight.  Consider a lower calorie diet and regular exercise.    It is important that you exercise regularly, at least 20 minutes 3 to 4 times per week.  If you develop chest pain or shortness of breath seek  medical attention.  Return in 6 months for follow-up

## 2014-05-06 NOTE — Progress Notes (Signed)
Pre visit review using our clinic review tool, if applicable. No additional management support is needed unless otherwise documented below in the visit note. 

## 2014-05-06 NOTE — Progress Notes (Signed)
Subjective:    Patient ID: Shelley Robinson, female    DOB: 23-Jul-1926, 80 y.o.   MRN: [id number]  HPI 80 year old patient who is seen today for her biannual follow-up.  She has a resolving URI, but in general doing quite well.  She has a history of diastolic dysfunction and her cardiac status has been stable.  She has treated dyslipidemia, essential hypertension and osteoarthritis.  Her back pain has been fairly stable.  Denies any exertional chest pain.  She remains on Crestor, which she continues to tolerate well.  Her blood pressure has been well controlled on her present regimen  BP Readings from Last 3 Encounters:  05/06/14 138/90  11/02/13 136/90  09/23/13 166/82    Past Medical History  Diagnosis Date  . Hypertension   . GERD (gastroesophageal reflux disease)   . Arthritis     History   Social History  . Marital Status: Widowed    Spouse Name: N/A    Number of Children: N/A  . Years of Education: N/A   Occupational History  . Not on file.   Social History Main Topics  . Smoking status: Former Smoker    Quit date: 07/08/1978  . Smokeless tobacco: Not on file  . Alcohol Use: 0.6 oz/week    1 Glasses of wine per week     Comment: once or twice a month  . Drug Use: Not on file  . Sexual Activity: Not on file   Other Topics Concern  . Not on file   Social History Narrative  . No narrative on file    Past Surgical History  Procedure Laterality Date  . Cholecystectomy    . Abdominal hysterectomy    . Joint replacement    . Tonsillectomy      No family history on file.  Allergies  Allergen Reactions  . Lisinopril Cough  . Piroxicam Other (See Comments)    unknown  . Prednisone Other (See Comments)    hypertensive  . Penicillins Rash    Current Outpatient Prescriptions on File Prior to Visit  Medication Sig Dispense Refill  . acetaminophen (TYLENOL) 500 MG tablet Take 1 tablet (500 mg total) by mouth every 6 (six) hours as needed for pain. Take  one  in AM and one at qhs, and one at noon if needed  30 tablet  0  . aspirin 81 MG EC tablet Take 81 mg by mouth daily.       . calcium-vitamin D (OSCAL WITH D 500-200) 500-200 MG-UNIT per tablet Take 1 tablet by mouth daily.       Marland Kitchen conjugated estrogens (PREMARIN) vaginal cream Place 1 Applicatorful vaginally as needed.      . diphenhydramine-acetaminophen (TYLENOL PM) 25-500 MG TABS Take 1 tablet by mouth at bedtime as needed. For sleep      . fish oil-omega-3 fatty acids 1000 MG capsule Take 1 g by mouth daily.        . hydrochlorothiazide (HYDRODIURIL) 25 MG tablet TAKE ONE TABLET BY MOUTH DAILY.  90 tablet  3  . hydroxypropyl methylcellulose (ISOPTO TEARS) 2.5 % ophthalmic solution Place 1 drop into both eyes daily as needed.      . loratadine (CLARITIN) 10 MG tablet Take 10 mg by mouth daily. Not sure of the mg/lc      . Multiple Vitamins-Minerals (PRESERVISION AREDS 2 PO) Take 1 tablet by mouth daily.      . mupirocin ointment (BACTROBAN) 2 % Apply 1 application topically  2 (two) times daily as needed.  22 g  2  . omeprazole (PRILOSEC) 20 MG capsule TAKE ONE CAPSULE (20 MG TOTAL) BY MOUTH TWO TIMES DAILY.  180 capsule  3  . omeprazole (PRILOSEC) 20 MG capsule TAKE ONE CAPSULE (20 MG TOTAL) BY MOUTH TWO TIMES DAILY.  180 capsule  3  . rosuvastatin (CRESTOR) 10 MG tablet Take 1 tablet (10 mg total) by mouth daily.  90 tablet  3  . traZODone (DESYREL) 50 MG tablet Take 0.5-1 tablets (25-50 mg total) by mouth at bedtime as needed for sleep.  30 tablet  3   No current facility-administered medications on file prior to visit.    BP 138/90  Pulse 93  Temp(Src) 98 F (36.7 C) (Oral)  Resp 20  Ht 5\' 1"  (1.549 m)  Wt 188 lb (85.276 kg)  BMI 35.54 kg/m2  SpO2 94%      Review of Systems  Constitutional: Negative.   HENT: Negative for congestion, dental problem, hearing loss, rhinorrhea, sinus pressure, sore throat and tinnitus.   Eyes: Negative for pain, discharge and visual  disturbance.  Respiratory: Negative for cough and shortness of breath.   Cardiovascular: Negative for chest pain, palpitations and leg swelling.  Gastrointestinal: Negative for nausea, vomiting, abdominal pain, diarrhea, constipation, blood in stool and abdominal distention.  Genitourinary: Negative for dysuria, urgency, frequency, hematuria, flank pain, vaginal bleeding, vaginal discharge, difficulty urinating, vaginal pain and pelvic pain.  Musculoskeletal: Positive for arthralgias, back pain and gait problem. Negative for joint swelling.  Skin: Negative for rash.  Neurological: Positive for light-headedness. Negative for dizziness, syncope, speech difficulty, weakness, numbness and headaches.  Hematological: Negative for adenopathy.  Psychiatric/Behavioral: Negative for behavioral problems, dysphoric mood and agitation. The patient is not nervous/anxious.        Objective:   Physical Exam  Constitutional: She is oriented to person, place, and time. She appears well-developed and well-nourished.  Repeat blood pressure 130/84  HENT:  Head: Normocephalic.  Right Ear: External ear normal.  Left Ear: External ear normal.  Mouth/Throat: Oropharynx is clear and moist.  Eyes: Conjunctivae and EOM are normal. Pupils are equal, round, and reactive to light.  Neck: Normal range of motion. Neck supple. No thyromegaly present.  Cardiovascular: Normal rate, regular rhythm, normal heart sounds and intact distal pulses.   Pulmonary/Chest: Effort normal and breath sounds normal.  Abdominal: Soft. Bowel sounds are normal. She exhibits no mass. There is no tenderness.  Musculoskeletal: Normal range of motion.  Lymphadenopathy:    She has no cervical adenopathy.  Neurological: She is alert and oriented to person, place, and time.  Skin: Skin is warm and dry. No rash noted.  Psychiatric: She has a normal mood and affect. Her behavior is normal.          Assessment & Plan:   Hypertension,  well-controlled Osteoarthritis, stable Dyslipidemia.  Continue Crestor History diastolic dysfunction.  Stable  CPX 6 months Weight loss, low-salt diet recommended

## 2014-05-09 ENCOUNTER — Telehealth: Payer: Self-pay | Admitting: Internal Medicine

## 2014-05-09 NOTE — Telephone Encounter (Signed)
emmi mailed  °

## 2014-07-20 ENCOUNTER — Ambulatory Visit: Payer: Medicare Other | Admitting: Podiatrist

## 2014-07-27 ENCOUNTER — Ambulatory Visit: Payer: Medicare Other | Admitting: Podiatrist

## 2014-08-12 ENCOUNTER — Encounter: Payer: Self-pay | Admitting: Podiatrist

## 2014-08-12 ENCOUNTER — Ambulatory Visit (INDEPENDENT_AMBULATORY_CARE_PROVIDER_SITE_OTHER): Payer: Medicare Other | Admitting: Podiatrist

## 2014-08-12 DIAGNOSIS — B351 Tinea unguium: Secondary | ICD-10-CM

## 2014-08-12 DIAGNOSIS — M79676 Pain in unspecified toe(s): Secondary | ICD-10-CM

## 2014-08-26 NOTE — Progress Notes (Signed)
HPI: Patient presents today for follow up of foot and nail care. Denies any new complaints today.  Objective: Patients chart is reviewed. Vascular status reveals pedal pulses noted at 2 out of 4 dp and pt bilateral . Neurological sensation is Normal to Semmes Weinstein monofilament bilateral. Patients nails are thickened, discolored, distrophic, friable and brittle with yellow-brown discoloration. Patient subjectively relates they are painful with shoes and with ambulation of bilateral feet.  Assessment: Symptomatic onychomycosis  Plan: Discussed treatment options and alternatives. The symptomatic toenails were debrided through manual an mechanical means without complication. Return appointment recommended at routine intervals of 3 months   

## 2014-09-16 ENCOUNTER — Other Ambulatory Visit: Payer: Self-pay

## 2014-09-16 DIAGNOSIS — Z1231 Encounter for screening mammogram for malignant neoplasm of breast: Secondary | ICD-10-CM

## 2014-10-06 ENCOUNTER — Telehealth: Payer: Self-pay | Admitting: Internal Medicine

## 2014-10-06 NOTE — Telephone Encounter (Signed)
[facility] - Client [facility]  Patient Name: Shelley Robinson  DOB: 1926-11-09    Initial Comment Caller states her eye is swollen and itching, her lip is also swollen. She is not sure if it is a reaction to something.   Nurse Assessment  Nurse: Justine Null, RN, Rodena Piety Date/Time Eilene Ghazi Time): 10/06/2014 4:21:18 PM  Confirm and document reason for call. If symptomatic, describe symptoms. ---Caller states her eye is swollen and itching, her lip is also swollen. She is not sure if it is a reaction to something. caller stated that she has no difficulty breathing and has happened 2 days ago and has been thinking she has had itchiness of the eye on the right side and has the lip and has a split on the bottom on the lip and has swelling on the bottom lip area and ahs had no fevers and has no food allergies  Has the patient traveled out of the country within the last 30 days? ---No  Does the patient require triage? ---Yes  Related visit to physician within the last 2 weeks? ---No  Does the PT have any chronic conditions? (i.e. diabetes, asthma, etc.) ---Yes  List chronic conditions. ---HTN     Guidelines    Guideline Title Affirmed Question Affirmed Notes  Face Swelling [1] Mild facial swelling (puffiness) AND [2] persists > 3 days    Final Disposition User   See PCP When Office is Open (within 3 days) Justine Null, Therapist, sports, Rodena Piety

## 2014-10-06 NOTE — Telephone Encounter (Signed)
Noted  

## 2014-10-07 ENCOUNTER — Ambulatory Visit (INDEPENDENT_AMBULATORY_CARE_PROVIDER_SITE_OTHER): Payer: Medicare Other | Admitting: Internal Medicine

## 2014-10-07 ENCOUNTER — Encounter: Payer: Self-pay | Admitting: Internal Medicine

## 2014-10-07 VITALS — BP 130/70 | HR 86 | Temp 97.9°F | Resp 20 | Ht 61.0 in | Wt 191.0 lb

## 2014-10-07 DIAGNOSIS — I10 Essential (primary) hypertension: Secondary | ICD-10-CM | POA: Diagnosis not present

## 2014-10-07 MED ORDER — PREDNISONE 10 MG PO TABS: 10.0000 mg | ORAL_TABLET | Freq: Two times a day (BID) | ORAL | Status: DC

## 2014-10-07 NOTE — Patient Instructions (Signed)
Limit your sodium (Salt) intake  Please check your blood pressure on a regular basis.  If it is consistently greater than 150/90, please make an office appointment.  Return in 6 months for follow-up   

## 2014-10-07 NOTE — Progress Notes (Signed)
Pre visit review using our clinic review tool, if applicable. No additional management support is needed unless otherwise documented below in the visit note. 

## 2014-10-07 NOTE — Progress Notes (Signed)
Subjective:    Patient ID: Shelley Robinson, female    DOB: [birth year], 81 y.o.   MRN: [id number]  HPI 81 year old patient who has a history of allergic rhinitis.  She does use Claritin and nasal steroids in a regular basis.  For the past few days she has had some redness, swelling about both eyes and also some conjunctival itching and redness.  She has some mild associated headache and some facial swelling Past Medical History  Diagnosis Date  . Hypertension   . GERD (gastroesophageal reflux disease)   . Arthritis     History   Social History  . Marital Status: Widowed    Spouse Name: N/A  . Number of Children: N/A  . Years of Education: N/A   Occupational History  . Not on file.   Social History Main Topics  . Smoking status: Former Smoker    Quit date: 07/08/1978  . Smokeless tobacco: Not on file  . Alcohol Use: 0.6 oz/week    1 Glasses of wine per week     Comment: once or twice a month  . Drug Use: Not on file  . Sexual Activity: Not on file   Other Topics Concern  . Not on file   Social History Narrative    Past Surgical History  Procedure Laterality Date  . Cholecystectomy    . Abdominal hysterectomy    . Joint replacement    . Tonsillectomy      No family history on file.  Allergies  Allergen Reactions  . Lisinopril Cough  . Piroxicam Other (See Comments)    unknown  . Prednisone Other (See Comments)    hypertensive  . Penicillins Rash    Current Outpatient Prescriptions on File Prior to Visit  Medication Sig Dispense Refill  . acetaminophen (TYLENOL) 500 MG tablet Take 1 tablet (500 mg total) by mouth every 6 (six) hours as needed for pain. Take one  in AM and one at qhs, and one at noon if needed 30 tablet 0  . aspirin 81 MG EC tablet Take 81 mg by mouth daily.     . calcium-vitamin D (OSCAL WITH D 500-200) 500-200 MG-UNIT per tablet Take 1 tablet by mouth daily.     Marland Kitchen conjugated estrogens (PREMARIN) vaginal cream Place 1 Applicatorful  vaginally as needed.    . diphenhydramine-acetaminophen (TYLENOL PM) 25-500 MG TABS Take 1 tablet by mouth at bedtime as needed. For sleep    . fish oil-omega-3 fatty acids 1000 MG capsule Take 1 g by mouth daily.      Marland Kitchen guaiFENesin (MUCINEX) 600 MG 12 hr tablet Take 600 mg by mouth 2 (two) times daily.    . hydrochlorothiazide (HYDRODIURIL) 25 MG tablet TAKE ONE TABLET BY MOUTH DAILY. 90 tablet 3  . hydroxypropyl methylcellulose (ISOPTO TEARS) 2.5 % ophthalmic solution Place 1 drop into both eyes daily as needed.    . loratadine (CLARITIN) 10 MG tablet Take 10 mg by mouth daily. Not sure of the mg/lc    . Multiple Vitamins-Minerals (PRESERVISION AREDS 2 PO) Take 1 tablet by mouth daily.    . mupirocin ointment (BACTROBAN) 2 % Apply 1 application topically 2 (two) times daily as needed. 22 g 2  . omeprazole (PRILOSEC) 20 MG capsule TAKE ONE CAPSULE (20 MG TOTAL) BY MOUTH TWO TIMES DAILY. 180 capsule 3  . rosuvastatin (CRESTOR) 10 MG tablet Take 1 tablet (10 mg total) by mouth daily. 90 tablet 3  . traZODone (DESYREL) 50 MG  tablet Take 0.5-1 tablets (25-50 mg total) by mouth at bedtime as needed for sleep. 30 tablet 3  . Triamcinolone Acetonide (NASACORT ALLERGY 24HR NA) Place 1 spray into the nose daily.     No current facility-administered medications on file prior to visit.    BP 130/70 mmHg  Pulse 86  Temp(Src) 97.9 F (36.6 C) (Oral)  Resp 20  Ht 5\' 1"  (1.549 m)  Wt 191 lb (86.637 kg)  BMI 36.11 kg/m2  SpO2 95%       Review of Systems  Constitutional: Negative.   HENT: Positive for congestion, postnasal drip and rhinorrhea. Negative for dental problem, hearing loss, sinus pressure, sore throat and tinnitus.   Eyes: Positive for redness and itching. Negative for pain, discharge and visual disturbance.  Respiratory: Negative for cough and shortness of breath.   Cardiovascular: Negative for chest pain, palpitations and leg swelling.  Gastrointestinal: Negative for nausea,  vomiting, abdominal pain, diarrhea, constipation, blood in stool and abdominal distention.  Genitourinary: Negative for dysuria, urgency, frequency, hematuria, flank pain, vaginal bleeding, vaginal discharge, difficulty urinating, vaginal pain and pelvic pain.  Musculoskeletal: Negative for joint swelling, arthralgias and gait problem.  Skin: Negative for rash.  Neurological: Negative for dizziness, syncope, speech difficulty, weakness, numbness and headaches.  Hematological: Negative for adenopathy.  Psychiatric/Behavioral: Negative for behavioral problems, dysphoric mood and agitation. The patient is not nervous/anxious.        Objective:   Physical Exam  Constitutional: She is oriented to person, place, and time. She appears well-developed and well-nourished.  HENT:  Head: Normocephalic.  Right Ear: External ear normal.  Left Ear: External ear normal.  Mouth/Throat: Oropharynx is clear and moist.  Mild conjunctival injection Minimal periorbital puffiness  Eyes: Conjunctivae and EOM are normal. Pupils are equal, round, and reactive to light.  Neck: Normal range of motion. Neck supple. No thyromegaly present.  Cardiovascular: Normal rate, regular rhythm, normal heart sounds and intact distal pulses.   Pulmonary/Chest: Effort normal and breath sounds normal.  Abdominal: Soft. Bowel sounds are normal. She exhibits no mass. There is no tenderness.  Musculoskeletal: Normal range of motion.  Lymphadenopathy:    She has no cervical adenopathy.  Neurological: She is alert and oriented to person, place, and time.  Skin: Skin is warm and dry. No rash noted.  Psychiatric: She has a normal mood and affect. Her behavior is normal.          Assessment & Plan:    Flare allergic rhinitis.  Will treat with prednisone 10 mg twice a day for 7 days.  Will continue Claritin and nasal steroids Hypertension, stable

## 2014-10-21 ENCOUNTER — Ambulatory Visit
Admission: RE | Admit: 2014-10-21 | Discharge: 2014-10-21 | Disposition: A | Discharge status: Home / Self Care | Payer: Medicare Other | Source: Ambulatory Visit

## 2014-10-21 DIAGNOSIS — Z1231 Encounter for screening mammogram for malignant neoplasm of breast: Secondary | ICD-10-CM | POA: Diagnosis not present

## 2014-10-28 ENCOUNTER — Ambulatory Visit (INDEPENDENT_AMBULATORY_CARE_PROVIDER_SITE_OTHER): Payer: Medicare Other | Admitting: Family Medicine

## 2014-10-28 ENCOUNTER — Encounter: Payer: Self-pay | Admitting: Family Medicine

## 2014-10-28 VITALS — BP 140/88 | HR 90 | Temp 97.8°F | Ht 61.0 in | Wt 192.3 lb

## 2014-10-28 DIAGNOSIS — B001 Herpesviral vesicular dermatitis: Secondary | ICD-10-CM

## 2014-10-28 MED ORDER — VALACYCLOVIR HCL 1 G PO TABS: 1000.0000 mg | ORAL_TABLET | Freq: Two times a day (BID) | ORAL | Status: DC

## 2014-10-28 NOTE — Patient Instructions (Signed)
Cold Sore  A cold sore (fever blister) is a skin infection caused by the herpes simplex virus (HSV-1). HSV-1 is closely related to the virus that causes genital herpes (HSV-2), but they are not the same even though both viruses can cause oral and genital infections. Cold sores are small, fluid-filled sores inside of the mouth or on the lips, gums, nose, chin, cheeks, or fingers.   The herpes simplex virus can be easily passed (contagious) to other people through close personal contact, such as kissing or sharing personal items. The virus can also spread to other parts of the body, such as the eyes or genitals. Cold sores are contagious until the sores crust over completely. They often heal within 2 weeks.   Once a person is infected, the herpes simplex virus remains permanently in the body. Therefore, there is no cure for cold sores, and they often recur when a person is tired, stressed, sick, or gets too much sun. Additional factors that can cause a recurrence include hormone changes in menstruation or pregnancy, certain drugs, and cold weather.   CAUSES   Cold sores are caused by the herpes simplex virus. The virus is spread from person to person through close contact, such as through kissing, touching the affected area, or sharing personal items such as lip balm, razors, or eating utensils.   SYMPTOMS   The first infection may not cause symptoms. If symptoms develop, the symptoms often go through different stages. Here is how a cold sore develops:   · Tingling, itching, or burning is felt 1-2 days before the outbreak.    · Fluid-filled blisters appear on the lips, inside the mouth, nose, or on the cheeks.    · The blisters start to ooze clear fluid.    · The blisters dry up and a yellow crust appears in its place.    · The crust falls off.    Symptoms depend on whether it is the initial outbreak or a recurrence. Some other symptoms with the first outbreak may include:   · Fever.    · Sore throat.    · Headache.     · Muscle aches.    · Swollen neck glands.    DIAGNOSIS   A diagnosis is often made based on your symptoms and looking at the sores. Sometimes, a sore may be swabbed and then examined in the lab to make a final diagnosis. If the sores are not present, blood tests can find the herpes simplex virus.   TREATMENT   There is no cure for cold sores and no vaccine for the herpes simplex virus. Within 2 weeks, most cold sores go away on their own without treatment. Medicines cannot make the infection go away, but medicine can help relieve some of the pain associated with the sores, can work to stop the virus from multiplying, and can also shorten healing time. Medicine may be in the form of creams, gels, pills, or a shot.   HOME CARE INSTRUCTIONS   · Only take over-the-counter or prescription medicines for pain, discomfort, or fever as directed by your caregiver. Do not use aspirin.    · Use a cotton-tip swab to apply creams or gels to your sores.    · Do not touch the sores or pick the scabs. Wash your hands often. Do not touch your eyes without washing your hands first.    · Avoid kissing, oral sex, and sharing personal items until sores heal.    · Apply an ice pack on your sores for 10-15 minutes to ease any discomfort.    ·   Avoid hot, cold, or salty foods because they may hurt your mouth. Eat a soft, bland diet to avoid irritating the sores. Use a straw to drink if you have pain when drinking out of a glass.    · Keep sores clean and dry to prevent an infection of other tissues.    · Avoid the sun and limit stress if these things trigger outbreaks. If sun causes cold sores, apply sunscreen on the lips before being out in the sun.    SEEK MEDICAL CARE IF:   · You have a fever or persistent symptoms for more than 2-3 days.    · You have a fever and your symptoms suddenly get worse.    · You have pus, not clear fluid, coming from the sores.    · You have redness that is spreading.    · You have pain or irritation in your  eye.    · You get sores on your genitals.    · Your sores do not heal within 2 weeks.    · You have a weakened immune system.    · You have frequent recurrences of cold sores.    MAKE SURE YOU:   · Understand these instructions.  · Will watch your condition.  · Will get help right away if you are not doing well or get worse.  Document Released: 06/21/2000 Document Revised: 11/08/2013 Document Reviewed: 11/06/2011  [employer]® Patient Information ©2015 [employer]. This information is not intended to replace advice given to you by your health care provider. Make sure you discuss any questions you have with your health care provider.

## 2014-10-28 NOTE — Progress Notes (Signed)
Pre visit review using our clinic review tool, if applicable. No additional management support is needed unless otherwise documented below in the visit note. 

## 2014-10-28 NOTE — Progress Notes (Signed)
HPI:    Blister on lower lip: -started a few days ago -has hx of this happening several time in the past remotely -blister and then crusted over, tingling pain -denies: fever, chills, lesions elsewhere  ROS: See pertinent positives and negatives per HPI.  Past Medical History  Diagnosis Date  . Hypertension   . GERD (gastroesophageal reflux disease)   . Arthritis     Past Surgical History  Procedure Laterality Date  . Cholecystectomy    . Abdominal hysterectomy    . Joint replacement    . Tonsillectomy      No family history on file.  History   Social History  . Marital Status: Widowed    Spouse Name: N/A  . Number of Children: N/A  . Years of Education: N/A   Social History Main Topics  . Smoking status: Former Smoker    Quit date: 07/08/1978  . Smokeless tobacco: Not on file  . Alcohol Use: 0.6 oz/week    1 Glasses of wine per week     Comment: once or twice a month  . Drug Use: Not on file  . Sexual Activity: Not on file   Other Topics Concern  . None   Social History Narrative     Current outpatient prescriptions:  .  acetaminophen (TYLENOL) 500 MG tablet, Take 1 tablet (500 mg total) by mouth every 6 (six) hours as needed for pain. Take one  in AM and one at qhs, and one at noon if needed, Disp: 30 tablet, Rfl: 0 .  aspirin 81 MG EC tablet, Take 81 mg by mouth daily. , Disp: , Rfl:  .  calcium-vitamin D (OSCAL WITH D 500-200) 500-200 MG-UNIT per tablet, Take 1 tablet by mouth daily. , Disp: , Rfl:  .  conjugated estrogens (PREMARIN) vaginal cream, Place 1 Applicatorful vaginally as needed., Disp: , Rfl:  .  diphenhydramine-acetaminophen (TYLENOL PM) 25-500 MG TABS, Take 1 tablet by mouth at bedtime as needed. For sleep, Disp: , Rfl:  .  fish oil-omega-3 fatty acids 1000 MG capsule, Take 1 g by mouth daily.  , Disp: , Rfl:  .  guaiFENesin (MUCINEX) 600 MG 12 hr tablet, Take 600 mg by mouth 2 (two) times daily., Disp: , Rfl:  .  hydrochlorothiazide  (HYDRODIURIL) 25 MG tablet, TAKE ONE TABLET BY MOUTH DAILY., Disp: 90 tablet, Rfl: 3 .  hydroxypropyl methylcellulose (ISOPTO TEARS) 2.5 % ophthalmic solution, Place 1 drop into both eyes daily as needed., Disp: , Rfl:  .  loratadine (CLARITIN) 10 MG tablet, Take 10 mg by mouth daily. Not sure of the mg/lc, Disp: , Rfl:  .  Multiple Vitamins-Minerals (PRESERVISION AREDS 2 PO), Take 1 tablet by mouth daily., Disp: , Rfl:  .  mupirocin ointment (BACTROBAN) 2 %, Apply 1 application topically 2 (two) times daily as needed., Disp: 22 g, Rfl: 2 .  omeprazole (PRILOSEC) 20 MG capsule, TAKE ONE CAPSULE (20 MG TOTAL) BY MOUTH TWO TIMES DAILY., Disp: 180 capsule, Rfl: 3 .  rosuvastatin (CRESTOR) 10 MG tablet, Take 1 tablet (10 mg total) by mouth daily., Disp: 90 tablet, Rfl: 3 .  traZODone (DESYREL) 50 MG tablet, Take 0.5-1 tablets (25-50 mg total) by mouth at bedtime as needed for sleep., Disp: 30 tablet, Rfl: 3 .  Triamcinolone Acetonide (NASACORT ALLERGY 24HR NA), Place 1 spray into the nose daily., Disp: , Rfl:  .  valACYclovir (VALTREX) 1000 MG tablet, Take 1 tablet (1,000 mg total) by mouth 2 (two) times daily.  For 1 day, Disp: 4 tablet, Rfl: 0  EXAM:  Filed Vitals:   10/28/14 1423  BP: 140/88  Pulse: 90  Temp: 97.8 F (36.6 C)    Body mass index is 36.35 kg/(m^2).  GENERAL: vitals reviewed and listed above, alert, oriented, appears well hydrated and in no acute distress  HEENT: atraumatic, conjunttiva clear, no obvious abnormalities on inspection of external nose and ears  NECK: no obvious masses on inspection  SKIN: ulcer lower lip with some crusting c/w herpes  MS: moves all extremities without noticeable abnormality  PSYCH: pleasant and cooperative, no obvious depression or anxiety  ASSESSMENT AND PLAN:  Discussed the following assessment and plan:  Cold sore - Plan: valACYclovir (VALTREX) 1000 MG tablet  -herpes likely given hx and exam findings -opted for trial lower dose  valtrex given age and renal function, gave enough tablets to try if ever recurs - has follow up with PCP and can recheck then -Patient advised to return or notify a doctor immediately if symptoms worsen or persist or new concerns arise.  There are no Patient Instructions on file for this visit.   Colin Benton R.

## 2014-10-31 ENCOUNTER — Ambulatory Visit: Payer: Medicare Other | Admitting: Internal Medicine

## 2014-11-10 ENCOUNTER — Ambulatory Visit: Payer: Medicare Other

## 2014-11-14 ENCOUNTER — Ambulatory Visit: Payer: Medicare Other | Admitting: Internal Medicine

## 2014-11-17 IMAGING — CR DG KNEE COMPLETE 4+V*R*
1 series · 1 of 1 positions shown · non-contrast
Comparison: None.

CLINICAL DATA: Fall

RIGHT KNEE - COMPLETE 4+ VIEW

[view not recorded]
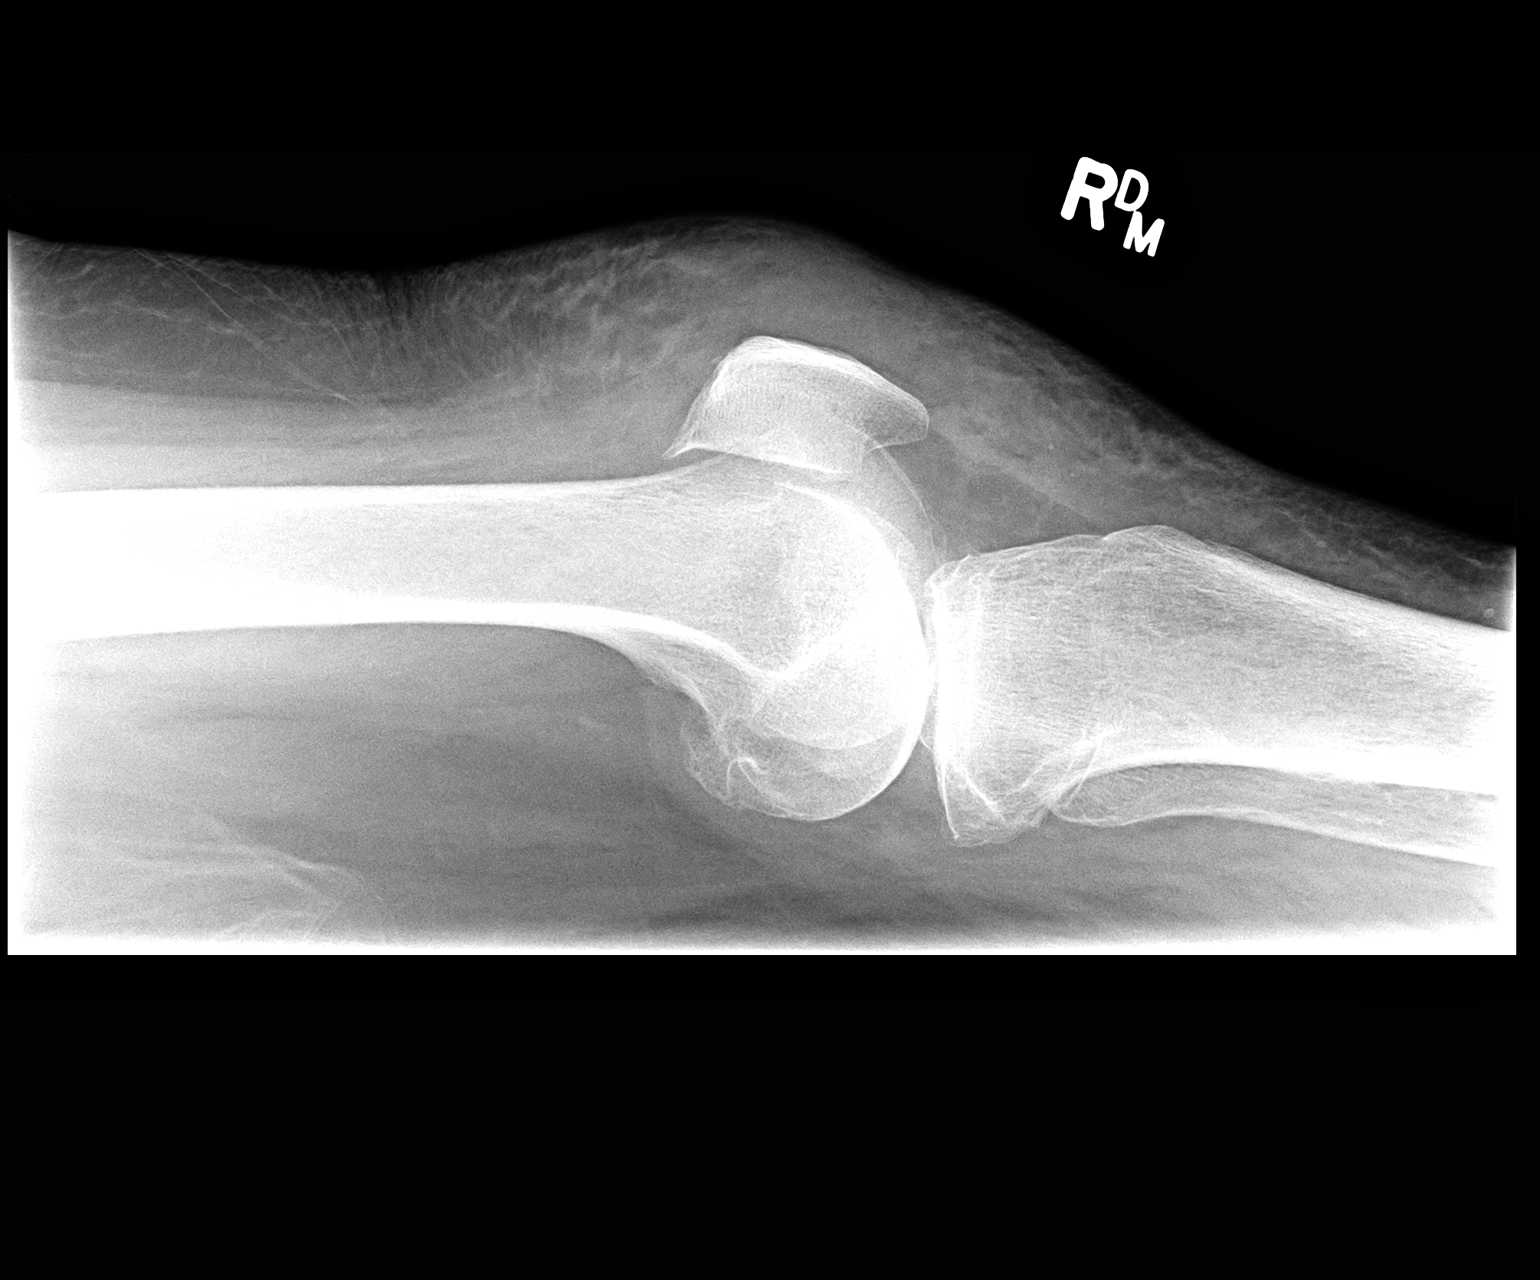

[1 of 1 positions shown; findings below may reference images not displayed]

FINDINGS: Negative for fracture or malalignment.  Joint space
narrowing and spurring in the medial compartment.  Soft tissue
swelling overlying the patella.
IMPRESSION: Negative for fracture.

## 2014-11-18 ENCOUNTER — Ambulatory Visit (INDEPENDENT_AMBULATORY_CARE_PROVIDER_SITE_OTHER): Payer: Medicare Other | Admitting: Internal Medicine

## 2014-11-18 ENCOUNTER — Encounter: Payer: Self-pay | Admitting: Internal Medicine

## 2014-11-18 ENCOUNTER — Ambulatory Visit: Payer: Medicare Other | Admitting: Internal Medicine

## 2014-11-18 ENCOUNTER — Other Ambulatory Visit: Payer: Self-pay | Admitting: *Deleted

## 2014-11-18 VITALS — BP 130/90 | HR 88 | Temp 97.8°F | Resp 20 | Ht 61.0 in | Wt 192.0 lb

## 2014-11-18 DIAGNOSIS — E785 Hyperlipidemia, unspecified: Secondary | ICD-10-CM

## 2014-11-18 DIAGNOSIS — Z0000 Encounter for general adult medical examination without abnormal findings: Secondary | ICD-10-CM

## 2014-11-18 DIAGNOSIS — I10 Essential (primary) hypertension: Secondary | ICD-10-CM | POA: Diagnosis not present

## 2014-11-18 DIAGNOSIS — M150 Primary generalized (osteo)arthritis: Secondary | ICD-10-CM

## 2014-11-18 DIAGNOSIS — M159 Polyosteoarthritis, unspecified: Secondary | ICD-10-CM

## 2014-11-18 LAB — COMPREHENSIVE METABOLIC PANEL
ALBUMIN: 3.9 g/dL (ref 3.5–5.2)
ALT: 18 U/L (ref 0–35)
AST: 23 U/L (ref 0–37)
Alkaline Phosphatase: 105 U/L (ref 39–117)
BUN: 18 mg/dL (ref 6–23)
CO2: 33 meq/L — AB (ref 19–32)
CREATININE: 1 mg/dL (ref 0.40–1.20)
Calcium: 9.5 mg/dL (ref 8.4–10.5)
Chloride: 99 mEq/L (ref 96–112)
GFR: 55.88 mL/min — AB (ref >60.00)
Glucose, Bld: 84 mg/dL (ref 70–99)
Potassium: 3.4 mEq/L — ABNORMAL LOW (ref 3.5–5.1)
Sodium: 139 mEq/L (ref 135–145)
TOTAL PROTEIN: 7 g/dL (ref 6.0–8.3)
Total Bilirubin: 0.6 mg/dL (ref 0.2–1.2)

## 2014-11-18 LAB — CBC WITH DIFFERENTIAL/PLATELET
BASOS ABS: 0 10*3/uL (ref 0.0–0.1)
Basophils Relative: 0.3 % (ref 0.0–3.0)
EOS PCT: 1.1 % (ref 0.0–5.0)
Eosinophils Absolute: 0.1 10*3/uL (ref 0.0–0.7)
HEMATOCRIT: 45.4 % (ref 36.0–46.0)
Hemoglobin: 15.5 g/dL — ABNORMAL HIGH (ref 12.0–15.0)
LYMPHS PCT: 21.6 % (ref 12.0–46.0)
Lymphs Abs: 1.5 10*3/uL (ref 0.7–4.0)
MCHC: 34.2 g/dL (ref 30.0–36.0)
MCV: 95.2 fl (ref 78.0–100.0)
MONO ABS: 0.5 10*3/uL (ref 0.1–1.0)
MONOS PCT: 7.9 % (ref 3.0–12.0)
NEUTROS PCT: 69.1 % (ref 43.0–77.0)
Neutro Abs: 4.7 10*3/uL (ref 1.4–7.7)
Platelets: 213 10*3/uL (ref 150.0–400.0)
RBC: 4.76 Mil/uL (ref 3.87–5.11)
RDW: 13.3 % (ref 11.5–15.5)
WBC: 6.8 10*3/uL (ref 4.0–10.5)

## 2014-11-18 LAB — LIPID PANEL
CHOL/HDL RATIO: 3
CHOLESTEROL: 192 mg/dL (ref 0–200)
HDL: 75.3 mg/dL (ref >39.00)
LDL Cholesterol: 85 mg/dL (ref 0–99)
NonHDL: 116.7
Triglycerides: 161 mg/dL — ABNORMAL HIGH (ref 0.0–149.0)
VLDL: 32.2 mg/dL (ref 0.0–40.0)

## 2014-11-18 LAB — TSH: TSH: 2.82 u[IU]/mL (ref 0.35–4.50)

## 2014-11-18 MED ORDER — HYDROCHLOROTHIAZIDE 25 MG PO TABS: 25.0000 mg | ORAL_TABLET | Freq: Every day | ORAL | Status: DC

## 2014-11-18 MED ORDER — TRAZODONE HCL 50 MG PO TABS: 25.0000 mg | ORAL_TABLET | Freq: Every evening | ORAL | Status: DC | PRN

## 2014-11-18 MED ORDER — MUPIROCIN 2 % EX OINT: 1.0000 "application " | TOPICAL_OINTMENT | Freq: Two times a day (BID) | CUTANEOUS | Status: DC | PRN

## 2014-11-18 NOTE — Progress Notes (Signed)
Pre visit review using our clinic review tool, if applicable. No additional management support is needed unless otherwise documented below in the visit note. 

## 2014-11-18 NOTE — Patient Instructions (Signed)
Limit your sodium (Salt) intake  You need to lose weight.  Consider a lower calorie diet and regular exercise.  Please check your blood pressure on a regular basis.  If it is consistently greater than 150/90, please make an office appointment.  Cardiac Diet This diet can help prevent heart disease and stroke. Many factors influence your heart health, including eating and exercise habits. Coronary risk rises a lot with abnormal blood fat (lipid) levels. Cardiac meal planning includes limiting unhealthy fats, increasing healthy fats, and making other small dietary changes. General guidelines are as follows:  Adjust calorie intake to reach and maintain desirable body weight.  Limit total fat intake to less than 30% of total calories. Saturated fat should be less than 7% of calories.  Saturated fats are found in animal products and in some vegetable products. Saturated vegetable fats are found in coconut oil, cocoa butter, palm oil, and palm kernel oil. Read labels carefully to avoid these products as much as possible. Use butter in moderation. Choose tub margarines and oils that have 2 grams of fat or less. Good cooking oils are canola and olive oils.  Practice low-fat cooking techniques. Do not fry food. Instead, broil, bake, boil, steam, grill, roast on a rack, stir-fry, or microwave it. Other fat reducing suggestions include:  Remove the skin from poultry.  Remove all visible fat from meats.  Skim the fat off stews, soups, and gravies before serving them.  Steam vegetables in water or broth instead of sauting them in fat.  Avoid foods with trans fat (or hydrogenated oils), such as commercially fried foods and commercially baked goods. Commercial shortening and deep-frying fats will contain trans fat.  Increase intake of fruits, vegetables, whole grains, and legumes to replace foods high in fat.  Increase consumption of nuts, legumes, and seeds to at least 4 servings weekly. One serving  of a legume equals  cup, and 1 serving of nuts or seeds equals  cup.  Choose whole grains more often. Have 3 servings per day (a serving is 1 ounce [oz]).  Eat 4 to 5 servings of vegetables per day. A serving of vegetables is 1 cup of raw leafy vegetables;  cup of raw or cooked cut-up vegetables;  cup of vegetable juice.  Eat 4 to 5 servings of fruit per day. A serving of fruit is 1 medium whole fruit;  cup of dried fruit;  cup of fresh, frozen, or canned fruit;  cup of 100% fruit juice.  Increase your intake of dietary fiber to 20 to 30 grams per day. Insoluble fiber may help lower your risk of heart disease and may help curb your appetite.  Soluble fiber binds cholesterol to be removed from the blood. Foods high in soluble fiber are dried beans, citrus fruits, oats, apples, bananas, broccoli, Brussels sprouts, and eggplant.  Try to include foods fortified with plant sterols or stanols, such as yogurt, breads, juices, or margarines. Choose several fortified foods to achieve a daily intake of 2 to 3 grams of plant sterols or stanols.  Foods with omega-3 fats can help reduce your risk of heart disease. Aim to have a 3.5 oz portion of fatty fish twice per week, such as salmon, mackerel, albacore tuna, sardines, lake trout, or herring. If you wish to take a fish oil supplement, choose one that contains 1 gram of both DHA and EPA.  Limit processed meats to 2 servings (3 oz portion) weekly.  Limit the sodium in your diet to 1500 milligrams (  mg) per day. If you have high blood pressure, talk to a registered dietitian about a DASH (Dietary Approaches to Stop Hypertension) eating plan.  Limit sweets and beverages with added sugar, such as soda, to no more than 5 servings per week. One serving is:   1 tablespoon sugar.  1 tablespoon jelly or jam.   cup sorbet.  1 cup lemonade.   cup regular soda. CHOOSING FOODS Starches  Allowed: Breads: All kinds (wheat, rye, raisin, white,  oatmeal, [place], [place], and English muffin bread). Low-fat rolls: English muffins, frankfurter and hamburger buns, bagels, pita bread, tortillas (not fried). Pancakes, waffles, biscuits, and muffins made with recommended oil.  Avoid: Products made with saturated or trans fats, oils, or whole milk products. Butter rolls, cheese breads, croissants. Commercial doughnuts, muffins, sweet rolls, biscuits, waffles, pancakes, store-bought mixes. Crackers  Allowed: Low-fat crackers and snacks: Animal, graham, rye, saltine (with recommended oil, no lard), oyster, and matzo crackers. Bread sticks, melba toast, rusks, flatbread, pretzels, and light popcorn.  Avoid: High-fat crackers: cheese crackers, butter crackers, and those made with coconut, palm oil, or trans fat (hydrogenated oils). Buttered popcorn. Cereals  Allowed: Hot or cold whole-grain cereals.  Avoid: Cereals containing coconut, hydrogenated vegetable fat, or animal fat. Potatoes / Pasta / Rice  Allowed: All kinds of potatoes, rice, and pasta (such as macaroni, spaghetti, and noodles).  Avoid: Pasta or rice prepared with cream sauce or high-fat cheese. Chow mein noodles, [place] fries. Vegetables  Allowed: All vegetables and vegetable juices.  Avoid: Fried vegetables. Vegetables in cream, butter, or high-fat cheese sauces. Limit coconut. Fruit in cream or custard. Protein  Allowed: Limit your intake of meat, seafood, and poultry to no more than 6 oz (cooked weight) per day. All lean, well-trimmed beef, veal, pork, and lamb. All chicken and [place] without skin. All fish and shellfish. Wild game: wild duck, rabbit, pheasant, and venison. Egg whites or low-cholesterol egg substitutes may be used as desired. Meatless dishes: recipes with dried beans, peas, lentils, and tofu (soybean curd). Seeds and nuts: all seeds and most nuts.  Avoid: Prime grade and other heavily marbled and fatty meats, such as short ribs, spare ribs, rib eye roast or  steak, frankfurters, sausage, bacon, and high-fat luncheon meats, mutton. Caviar. Commercially fried fish. Domestic duck, goose, venison sausage. Organ meats: liver, gizzard, heart, chitterlings, brains, kidney, sweetbreads. Dairy  Allowed: Low-fat cheeses: nonfat or low-fat cottage cheese (1% or 2% fat), cheeses made with part skim milk, such as mozzarella, farmers, string, or ricotta. (Cheeses should be labeled no more than 2 to 6 grams fat per oz.). Skim (or 1%) milk: liquid, powdered, or evaporated. Buttermilk made with low-fat milk. Drinks made with skim or low-fat milk or cocoa. Chocolate milk or cocoa made with skim or low-fat (1%) milk. Nonfat or low-fat yogurt.  Avoid: Whole milk cheeses, including colby, cheddar, [place], Monte[place] Jack, [place], [place], [place], American, Swiss, and blue. Creamed cottage cheese, cream cheese. Whole milk and whole milk products, including buttermilk or yogurt made from whole milk, drinks made from whole milk. Condensed milk, evaporated whole milk, and 2% milk. Soups and Combination Foods  Allowed: Low-fat low-sodium soups: broth, dehydrated soups, homemade broth, soups with the fat removed, homemade cream soups made with skim or low-fat milk. Low-fat spaghetti, lasagna, chili, and Spanish rice if low-fat ingredients and low-fat cooking techniques are used.  Avoid: Cream soups made with whole milk, cream, or high-fat cheese. All other soups. Desserts and Sweets  Allowed: Sherbet, fruit ices, gelatins,  meringues, and angel food cake. Homemade desserts with recommended fats, oils, and milk products. Jam, jelly, honey, marmalade, sugars, and syrups. Pure sugar candy, such as gum drops, hard candy, jelly beans, marshmallows, mints, and small amounts of dark chocolate.  Avoid: Commercially prepared cakes, pies, cookies, frosting, pudding, or mixes for these products. Desserts containing whole milk products, chocolate, coconut, lard, palm oil, or palm kernel oil.  Ice cream or ice cream drinks. Candy that contains chocolate, coconut, butter, hydrogenated fat, or unknown ingredients. Buttered syrups. Fats and Oils  Allowed: Vegetable oils: safflower, sunflower, corn, soybean, cottonseed, sesame, canola, olive, or peanut. Non-hydrogenated margarines. Salad dressing or mayonnaise: homemade or commercial, made with a recommended oil. Low or nonfat salad dressing or mayonnaise.  Limit added fats and oils to 6 to 8 tsp per day (includes fats used in cooking, baking, salads, and spreads on bread). Remember to count the "hidden fats" in foods.  Avoid: Solid fats and shortenings: butter, lard, salt pork, bacon drippings. Gravy containing meat fat, shortening, or suet. Cocoa butter, coconut. Coconut oil, palm oil, palm kernel oil, or hydrogenated oils: these ingredients are often used in bakery products, nondairy creamers, whipped toppings, candy, and commercially fried foods. Read labels carefully. Salad dressings made of unknown oils, sour cream, or cheese, such as blue cheese and Roquefort. Cream, all kinds: half-and-half, light, heavy, or whipping. Sour cream or cream cheese (even if "light" or low-fat). Nondairy cream substitutes: coffee creamers and sour cream substitutes made with palm, palm kernel, hydrogenated oils, or coconut oil. Beverages  Allowed: Coffee (regular or decaffeinated), tea. Diet carbonated beverages, mineral water. Alcohol: Check with your caregiver. Moderation is recommended.  Avoid: Whole milk, regular sodas, and juice drinks with added sugar. Condiments  Allowed: All seasonings and condiments. Cocoa powder. "Cream" sauces made with recommended ingredients.  Avoid: Carob powder made with hydrogenated fats. SAMPLE MENU Breakfast   cup orange juice   cup oatmeal  1 slice toast  1 tsp margarine  1 cup skim milk Lunch  [place] sandwich with 2 oz [place], 2 slices bread  Lettuce and tomato slices  Fresh fruit  Carrot  sticks  Coffee or tea Snack  Fresh fruit or low-fat crackers Dinner  3 oz lean ground beef  1 baked potato  1 tsp margarine   cup asparagus  Lettuce salad  1 tbs non-creamy dressing   cup peach slices  1 cup skim milk Document Released: 04/02/2008 Document Revised: 12/24/2011 Document Reviewed: 08/24/2013 [employer] Patient Information 2015 [place], [place]. This information is not intended to replace advice given to you by your health care provider. Make sure you discuss any questions you have with your health care provider. Health Maintenance Adopting a healthy lifestyle and getting preventive care can go a long way to promote health and wellness. Talk with your health care provider about what schedule of regular examinations is right for you. This is a good chance for you to check in with your provider about disease prevention and staying healthy. In between checkups, there are plenty of things you can do on your own. Experts have done a lot of research about which lifestyle changes and preventive measures are most likely to keep you healthy. Ask your health care provider for more information. WEIGHT AND DIET  Eat a healthy diet  Be sure to include plenty of vegetables, fruits, low-fat dairy products, and lean protein.  Do not eat a lot of foods high in solid fats, added sugars, or salt.  Get regular exercise.  This is one of the most important things you can do for your health.  Most adults should exercise for at least 150 minutes each week. The exercise should increase your heart rate and make you sweat (moderate-intensity exercise).  Most adults should also do strengthening exercises at least twice a week. This is in addition to the moderate-intensity exercise.  Maintain a healthy weight  Body mass index (BMI) is a measurement that can be used to identify possible weight problems. It estimates body fat based on height and weight. Your health care provider can help  determine your BMI and help you achieve or maintain a healthy weight.  For females 75 years of age and older:   A BMI below 18.5 is considered underweight.  A BMI of 18.5 to 24.9 is normal.  A BMI of 25 to 29.9 is considered overweight.  A BMI of 30 and above is considered obese.  Watch levels of cholesterol and blood lipids  You should start having your blood tested for lipids and cholesterol at 79 years of age, then have this test every 5 years.  You may need to have your cholesterol levels checked more often if:  Your lipid or cholesterol levels are high.  You are older than 79 years of age.  You are at high risk for heart disease.  CANCER SCREENING   Lung Cancer  Lung cancer screening is recommended for adults 10-58 years old who are at high risk for lung cancer because of a history of smoking.  A yearly low-dose CT scan of the lungs is recommended for people who:  Currently smoke.  Have quit within the past 15 years.  Have at least a 30-pack-year history of smoking. A pack year is smoking an average of one pack of cigarettes a day for 1 year.  Yearly screening should continue until it has been 15 years since you quit.  Yearly screening should stop if you develop a health problem that would prevent you from having lung cancer treatment.  Breast Cancer  Practice breast self-awareness. This means understanding how your breasts normally appear and feel.  It also means doing regular breast self-exams. Let your health care provider know about any changes, no matter how small.  If you are in your 20s or 30s, you should have a clinical breast exam (CBE) by a health care provider every 1-3 years as part of a regular health exam.  If you are 43 or older, have a CBE every year. Also consider having a breast X-ray (mammogram) every year.  If you have a family history of breast cancer, talk to your health care provider about genetic screening.  If you are at high risk  for breast cancer, talk to your health care provider about having an MRI and a mammogram every year.  Breast cancer gene (BRCA) assessment is recommended for women who have family members with BRCA-related cancers. BRCA-related cancers include:  Breast.  Ovarian.  Tubal.  Peritoneal cancers.  Results of the assessment will determine the need for genetic counseling and BRCA1 and BRCA2 testing. Cervical Cancer Routine pelvic examinations to screen for cervical cancer are no longer recommended for nonpregnant women who are considered low risk for cancer of the pelvic organs (ovaries, uterus, and vagina) and who do not have symptoms. A pelvic examination may be necessary if you have symptoms including those associated with pelvic infections. Ask your health care provider if a screening pelvic exam is right for you.   The Pap test  is the screening test for cervical cancer for women who are considered at risk.  If you had a hysterectomy for a problem that was not cancer or a condition that could lead to cancer, then you no longer need Pap tests.  If you are older than 65 years, and you have had normal Pap tests for the past 10 years, you no longer need to have Pap tests.  If you have had past treatment for cervical cancer or a condition that could lead to cancer, you need Pap tests and screening for cancer for at least 20 years after your treatment.  If you no longer get a Pap test, assess your risk factors if they change (such as having a new sexual partner). This can affect whether you should start being screened again.  Some women have medical problems that increase their chance of getting cervical cancer. If this is the case for you, your health care provider may recommend more frequent screening and Pap tests.  The human papillomavirus (HPV) test is another test that may be used for cervical cancer screening. The HPV test looks for the virus that can cause cell changes in the cervix. The  cells collected during the Pap test can be tested for HPV.  The HPV test can be used to screen women 24 years of age and older. Getting tested for HPV can extend the interval between normal Pap tests from three to five years.  An HPV test also should be used to screen women of any age who have unclear Pap test results.  After 79 years of age, women should have HPV testing as often as Pap tests.  Colorectal Cancer  This type of cancer can be detected and often prevented.  Routine colorectal cancer screening usually begins at 79 years of age and continues through 79 years of age.  Your health care provider may recommend screening at an earlier age if you have risk factors for colon cancer.  Your health care provider may also recommend using home test kits to check for hidden blood in the stool.  A small camera at the end of a tube can be used to examine your colon directly (sigmoidoscopy or colonoscopy). This is done to check for the earliest forms of colorectal cancer.  Routine screening usually begins at age 100.  Direct examination of the colon should be repeated every 5-10 years through 79 years of age. However, you may need to be screened more often if early forms of precancerous polyps or small growths are found. Skin Cancer  Check your skin from head to toe regularly.  Tell your health care provider about any new moles or changes in moles, especially if there is a change in a mole's shape or color.  Also tell your health care provider if you have a mole that is larger than the size of a pencil eraser.  Always use sunscreen. Apply sunscreen liberally and repeatedly throughout the day.  Protect yourself by wearing long sleeves, pants, a wide-brimmed hat, and sunglasses whenever you are outside. HEART DISEASE, DIABETES, AND HIGH BLOOD PRESSURE   Have your blood pressure checked at least every 1-2 years. High blood pressure causes heart disease and increases the risk of  stroke.  If you are between 55 years and 71 years old, ask your health care provider if you should take aspirin to prevent strokes.  Have regular diabetes screenings. This involves taking a blood sample to check your fasting blood sugar level.  If you  are at a normal weight and have a low risk for diabetes, have this test once every three years after 79 years of age.  If you are overweight and have a high risk for diabetes, consider being tested at a younger age or more often. PREVENTING INFECTION  Hepatitis B  If you have a higher risk for hepatitis B, you should be screened for this virus. You are considered at high risk for hepatitis B if:  You were born in a country where hepatitis B is common. Ask your health care provider which countries are considered high risk.  Your parents were born in a high-risk country, and you have not been immunized against hepatitis B (hepatitis B vaccine).  You have HIV or AIDS.  You use needles to inject street drugs.  You live with someone who has hepatitis B.  You have had sex with someone who has hepatitis B.  You get hemodialysis treatment.  You take certain medicines for conditions, including cancer, organ transplantation, and autoimmune conditions. Hepatitis C  Blood testing is recommended for:  Everyone born from [birth year] through 1963.  Anyone with known risk factors for hepatitis C. Sexually transmitted infections (STIs)  You should be screened for sexually transmitted infections (STIs) including gonorrhea and chlamydia if:  You are sexually active and are younger than 79 years of age.  You are older than 79 years of age and your health care provider tells you that you are at risk for this type of infection.  Your sexual activity has changed since you were last screened and you are at an increased risk for chlamydia or gonorrhea. Ask your health care provider if you are at risk.  If you do not have HIV, but are at risk, it may be  recommended that you take a prescription medicine daily to prevent HIV infection. This is called pre-exposure prophylaxis (PrEP). You are considered at risk if:  You are sexually active and do not regularly use condoms or know the HIV status of your partner(s).  You take drugs by injection.  You are sexually active with a partner who has HIV. Talk with your health care provider about whether you are at high risk of being infected with HIV. If you choose to begin PrEP, you should first be tested for HIV. You should then be tested every 3 months for as long as you are taking PrEP.  PREGNANCY   If you are premenopausal and you may become pregnant, ask your health care provider about preconception counseling.  If you may become pregnant, take 400 to 800 micrograms (mcg) of folic acid every day.  If you want to prevent pregnancy, talk to your health care provider about birth control (contraception). OSTEOPOROSIS AND MENOPAUSE   Osteoporosis is a disease in which the bones lose minerals and strength with aging. This can result in serious bone fractures. Your risk for osteoporosis can be identified using a bone density scan.  If you are 69 years of age or older, or if you are at risk for osteoporosis and fractures, ask your health care provider if you should be screened.  Ask your health care provider whether you should take a calcium or vitamin D supplement to lower your risk for osteoporosis.  Menopause may have certain physical symptoms and risks.  Hormone replacement therapy may reduce some of these symptoms and risks. Talk to your health care provider about whether hormone replacement therapy is right for you.  HOME CARE INSTRUCTIONS   Schedule regular  health, dental, and eye exams.  Stay current with your immunizations.   Do not use any tobacco products including cigarettes, chewing tobacco, or electronic cigarettes.  If you are pregnant, do not drink alcohol.  If you are  breastfeeding, limit how much and how often you drink alcohol.  Limit alcohol intake to no more than 1 drink per day for nonpregnant women. One drink equals 12 ounces of beer, 5 ounces of wine, or 1 ounces of hard liquor.  Do not use street drugs.  Do not share needles.  Ask your health care provider for help if you need support or information about quitting drugs.  Tell your health care provider if you often feel depressed.  Tell your health care provider if you have ever been abused or do not feel safe at home. Document Released: 01/07/2011 Document Revised: 11/08/2013 Document Reviewed: 05/26/2013 [facility] Patient Information 2015 [place], [place]. This information is not intended to replace advice given to you by your health care provider. Make sure you discuss any questions you have with your health care provider.

## 2014-11-18 NOTE — Progress Notes (Signed)
Patient ID: Shelley Robinson, female   DOB: 12-29-1926, 81 y.o.   MRN: [id number]  Subjective:    Patient ID: Shelley Robinson, female    DOB: 11-Oct-1926, 81 y.o.   MRN: [id number]  HPI  CC: cpx - doing ok.   History of Present Illness:   Wt Readings from Last 3 Encounters:  10/28/14 192 lb 4.8 oz (87.227 kg)  10/07/14 191 lb (86.637 kg)  05/06/14 188 lb (85.276 kg)    81   year-old patient who is seen today for a comprehensive evaluation.  She fractured her left humerus in October of 2010 and had surgery at [facility]. She states that she had a 2-D echocardiogram at that time. She has long a long history of dyspnea on exertion. She has treated hypertension, dyslipidemia, and significant osteoarthritis. She has a history of exogenous obesity.  Problems include hypertension, dyslipidemia, and osteoarthritis  Here for Medicare AWV:   1. Risk factors based on Past M, S, F history: risk factors include hypertension, and dyslipidemia.  2. Physical Activities: very sedentary due to obesity, arthritis, and dyspnea on exertion  3. Depression/mood: history depression, or mood disorder  4. Hearing: no deficits  5. ADL's: independent in all aspects of daily living, but sedentary  6. Fall Risk: moderate due to age, obesity, arthritis; does fall occasionally.  History of fracture October 2010  7. Home Safety: no problems identified  8. Height, weight, &visual acuity: there continues to be modest weight gain.  Followed by ophthalmology for macular degeneration  9. Counseling: weight loss encouraged  10. Labs ordered based on risk factors: laboratory profile, including lipid panel will be reviewed  11. Referral Coordination-  N/A 12. Care Plan- weight loss, heart healthy diet or exercise. All encouraged  13. Cognitive Assessment- alert and oriented, with a normal affect. She feels that she may have become slightly more forgetful. 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 No  family concerns with memory  14.  Preventive services will include annual clinical exams with screening lab.  Annual eye examinations.  Also encouraged.  15.  Provider list updated.  Includes primary care and ophthalmology  Preventive Screening-Counseling & Management  Alcohol-Tobacco  Smoking Status: quit   Allergies:  1) ! Penicillin G Pot in Dextrose (Penicillin G Potassium in D5w)  2) ! Feldene   Past History:  Past Medical History:   Hyperlipidemia  Hypertension  DJD  Low back pain  Knee pain  exogenous obesity  Osteoarthritis    Past Surgical History:   Appendectomy  Cholecystectomy  Hysterectomy  Tonsillectomy  cataract surgery OD  2 D Echo 10-11 ([facility])  colonoscopy none- prior sigmoidoscopy 2003  surgery for fracture, left humerus September 2010   Family History:    Father ded age 12: cerebrovascular disease CAD, prostate cancer  mother died at age 35, senile dementia  one sister died of a lymphoma   Social History:   3 children  [place]-October 2006    Review of Systems  Constitutional: Negative for fever, appetite change, fatigue and unexpected weight change.  HENT: Negative for congestion, dental problem, ear pain, hearing loss, mouth sores, nosebleeds, sinus pressure, sore throat, tinnitus, trouble swallowing and voice change.   Eyes: Negative for photophobia, pain, redness and visual disturbance.  Respiratory: Negative for cough, chest tightness and shortness of breath.   Cardiovascular: Negative for chest pain, palpitations and leg swelling.  Gastrointestinal: Negative for nausea, vomiting, abdominal pain, diarrhea, constipation, blood in stool, abdominal distention and rectal  pain.  Genitourinary: Negative for dysuria, urgency, frequency, hematuria, flank pain, vaginal bleeding, vaginal discharge, difficulty urinating, genital sores, vaginal pain, menstrual problem and pelvic pain.  Musculoskeletal: Positive for back pain and gait problem.  Negative for arthralgias and neck stiffness.  Skin: Negative for rash.  Neurological: Negative for dizziness, syncope, speech difficulty, weakness, light-headedness, numbness and headaches.  Hematological: Negative for adenopathy. Does not bruise/bleed easily.  Psychiatric/Behavioral: Negative for suicidal ideas, behavioral problems, self-injury, dysphoric mood and agitation. The patient is not nervous/anxious.        Objective:   Physical Exam  Constitutional: She is oriented to person, place, and time. She appears well-developed and well-nourished.  Repeat blood pressure 130/86  HENT:  Head: Normocephalic and atraumatic.  Right Ear: External ear normal.  Left Ear: External ear normal.  Mouth/Throat: Oropharynx is clear and moist.  Dentures in place  Pharyngeal crowding  Eyes: Conjunctivae and EOM are normal.  Neck: Normal range of motion. Neck supple. No JVD present. No thyromegaly present.  Cardiovascular: Normal rate, regular rhythm and normal heart sounds.   No murmur heard. Pedal pulses full, except for an absent right dorsalis pedis pulse  Pulmonary/Chest: Effort normal and breath sounds normal. She has no wheezes. She has no rales.  Kyphosis  Abdominal: Soft. Bowel sounds are normal. She exhibits no distension and no mass. There is no tenderness. There is no rebound and no guarding.  Genitourinary: Vagina normal.  Musculoskeletal: Normal range of motion. She exhibits edema. She exhibits no tenderness.  Trace lower extremity edema  Neurological: She is alert and oriented to person, place, and time. She has normal reflexes. No cranial nerve deficit. She exhibits normal muscle tone. Coordination normal.  Skin: Skin is warm and dry. No rash noted.  Dry flaky skin of the feet especially laterally Onychomycotic toes  Psychiatric: She has a normal mood and affect. Her behavior is normal.          Assessment & Plan:   Preventive health examination Hypertension  stable Exogenous obesity. Weight loss encouraged  Dyslipidemia. We'll check a lipid panel continue Crestor Osteoarthritis with chronic low back pain   Recheck 6 months by Dr. Ansel Bong office at 1126 (

## 2014-11-24 ENCOUNTER — Other Ambulatory Visit: Payer: Self-pay | Admitting: Family Medicine

## 2014-11-24 ENCOUNTER — Telehealth: Payer: Self-pay | Admitting: Internal Medicine

## 2014-11-24 NOTE — Telephone Encounter (Signed)
Pt would like to know if she can get order for walker w/seat. Pt is having knee pain

## 2014-11-25 NOTE — Telephone Encounter (Signed)
Spoke to pt told her order for Rollator was faxed to [facility]. Pt verbalized understanding.

## 2014-11-25 NOTE — Telephone Encounter (Signed)
Spoke to pt asked her what supply place she would like to use for walker? Pt said she did not know, gave choices to pt and she picked [facility]. Told pt okay will fax order over to them she will just need to call them to give insurance information. Gave pt address and phone number. Pt verbalized understanding.

## 2014-12-30 DIAGNOSIS — H3531 Nonexudative age-related macular degeneration: Secondary | ICD-10-CM | POA: Diagnosis not present

## 2014-12-30 DIAGNOSIS — Z961 Presence of intraocular lens: Secondary | ICD-10-CM | POA: Diagnosis not present

## 2015-03-03 ENCOUNTER — Ambulatory Visit (INDEPENDENT_AMBULATORY_CARE_PROVIDER_SITE_OTHER): Payer: Medicare Other | Admitting: Family Medicine

## 2015-03-03 ENCOUNTER — Encounter: Payer: Self-pay | Admitting: Family Medicine

## 2015-03-03 VITALS — BP 132/82 | HR 92 | Temp 97.9°F | Ht 61.0 in | Wt 190.5 lb

## 2015-03-03 DIAGNOSIS — R300 Dysuria: Secondary | ICD-10-CM | POA: Diagnosis not present

## 2015-03-03 LAB — POCT URINALYSIS DIPSTICK
Bilirubin, UA: NEGATIVE
Glucose, UA: NEGATIVE
Ketones, UA: NEGATIVE
LEUKOCYTES UA: NEGATIVE
NITRITE UA: NEGATIVE
Spec Grav, UA: >=1.03
UROBILINOGEN UA: 0.2
pH, UA: 6

## 2015-03-03 LAB — URINALYSIS, MICROSCOPIC ONLY

## 2015-03-03 MED ORDER — SULFAMETHOXAZOLE-TRIMETHOPRIM 800-160 MG PO TABS: 1.0000 | ORAL_TABLET | Freq: Two times a day (BID) | ORAL | Status: DC

## 2015-03-03 NOTE — Addendum Note (Signed)
Addended by: Lucretia Kern on: 03/03/2015 11:36 AM   Modules accepted: Orders

## 2015-03-03 NOTE — Progress Notes (Signed)
Pre visit review using our clinic review tool, if applicable. No additional management support is needed unless otherwise documented below in the visit note. 

## 2015-03-03 NOTE — Addendum Note (Signed)
Addended by: Agnes Lawrence on: 03/03/2015 11:23 AM   Modules accepted: Orders

## 2015-03-03 NOTE — Progress Notes (Addendum)
HPI:  Dysuria: -started yesterday -symptoms: urinary urgency, frequency, dysuria, urine darker -denies: fevers, NV, flank pain, abd pain, vaginal discharge  ROS: See pertinent positives and negatives per HPI.  Past Medical History  Diagnosis Date  . Hypertension   . GERD (gastroesophageal reflux disease)   . Arthritis     Past Surgical History  Procedure Laterality Date  . Cholecystectomy    . Abdominal hysterectomy    . Joint replacement    . Tonsillectomy      No family history on file.  Social History   Social History  . Marital Status: Widowed    Spouse Name: N/A  . Number of Children: N/A  . Years of Education: N/A   Social History Main Topics  . Smoking status: Former Smoker    Quit date: 07/08/1978  . Smokeless tobacco: None  . Alcohol Use: 0.6 oz/week    1 Glasses of wine per week     Comment: once or twice a month  . Drug Use: None  . Sexual Activity: Not Asked   Other Topics Concern  . None   Social History Narrative     Current outpatient prescriptions:  .  acetaminophen (TYLENOL) 500 MG tablet, Take 1 tablet (500 mg total) by mouth every 6 (six) hours as needed for pain. Take one  in AM and one at qhs, and one at noon if needed, Disp: 30 tablet, Rfl: 0 .  aspirin 81 MG EC tablet, Take 81 mg by mouth daily. , Disp: , Rfl:  .  calcium-vitamin D (OSCAL WITH D 500-200) 500-200 MG-UNIT per tablet, Take 1 tablet by mouth daily. , Disp: , Rfl:  .  conjugated estrogens (PREMARIN) vaginal cream, Place 1 Applicatorful vaginally as needed., Disp: , Rfl:  .  diphenhydramine-acetaminophen (TYLENOL PM) 25-500 MG TABS, Take 1 tablet by mouth at bedtime as needed. For sleep, Disp: , Rfl:  .  fish oil-omega-3 fatty acids 1000 MG capsule, Take 1 g by mouth daily.  , Disp: , Rfl:  .  guaiFENesin (MUCINEX) 600 MG 12 hr tablet, Take 600 mg by mouth 2 (two) times daily., Disp: , Rfl:  .  hydrochlorothiazide (HYDRODIURIL) 25 MG tablet, Take 1 tablet (25 mg total) by  mouth daily., Disp: 90 tablet, Rfl: 3 .  hydroxypropyl methylcellulose (ISOPTO TEARS) 2.5 % ophthalmic solution, Place 1 drop into both eyes daily as needed., Disp: , Rfl:  .  loratadine (CLARITIN) 10 MG tablet, Take 10 mg by mouth daily. Not sure of the mg/lc, Disp: , Rfl:  .  Multiple Vitamins-Minerals (PRESERVISION AREDS 2 PO), Take 1 tablet by mouth daily., Disp: , Rfl:  .  mupirocin ointment (BACTROBAN) 2 %, Apply 1 application topically 2 (two) times daily as needed., Disp: 30 g, Rfl: 5 .  omeprazole (PRILOSEC) 20 MG capsule, TAKE ONE CAPSULE (20 MG TOTAL) BY MOUTH TWO TIMES DAILY., Disp: 180 capsule, Rfl: 3 .  rosuvastatin (CRESTOR) 10 MG tablet, Take 1 tablet (10 mg total) by mouth daily., Disp: 90 tablet, Rfl: 3 .  traZODone (DESYREL) 50 MG tablet, Take 0.5-1 tablets (25-50 mg total) by mouth at bedtime as needed for sleep., Disp: 30 tablet, Rfl: 5 .  Triamcinolone Acetonide (NASACORT ALLERGY 24HR NA), Place 1 spray into the nose daily., Disp: , Rfl:  .  valACYclovir (VALTREX) 1000 MG tablet, TAKE 1 TABLET (1,000 MG TOTAL) BY MOUTH 2 (TWO) TIMES DAILY. FOR 1 DAY, Disp: 30 tablet, Rfl: 1 .  sulfamethoxazole-trimethoprim (BACTRIM DS,SEPTRA DS) 800-160 MG  per tablet, Take 1 tablet by mouth 2 (two) times daily., Disp: 6 tablet, Rfl: 0  EXAM:  Filed Vitals:   03/03/15 1050  BP: 132/82  Pulse: 92  Temp: 97.9 F (36.6 C)    Body mass index is 36.01 kg/(m^2).  GENERAL: vitals reviewed and listed above, alert, oriented, appears well hydrated and in no acute distress  HEENT: atraumatic, conjunttiva clear, no obvious abnormalities on inspection of external nose and ears  NECK: no obvious masses on inspection  LUNGS: clear to auscultation bilaterally, no wheezes, rales or rhonchi, good air movement  CV: HRRR, no peripheral edema  ABD: declined getting on table for exam, advised difficult to do a good exam sitting but she declined again, no CVA TTP, no abd TTP or abnormalities on seated  exam  MS: moves all extremities without noticeable abnormality  PSYCH: pleasant and cooperative, no obvious depression or anxiety  ASSESSMENT AND PLAN:  Discussed the following assessment and plan:  Dysuria - Plan: POC Urinalysis Dipstick, Urine Microscopic Only, Culture, Urine, sulfamethoxazole-trimethoprim (BACTRIM DS,SEPTRA DS) 800-160 MG per tablet  -udip and reflex culture - udip with bld and p -symptoms suggest infection, will get micro and culture -discussed empiric abx vs waiting on results - and she wants to do this as feels this is a UTI; pen allergy and insurance wont pay for macrobid so she opted for bactrim - risks discussed -heading into weekend, UCC/ED options over weekend discussed incase worsening  -Patient advised to return or notify a doctor immediately if symptoms worsen or persist or new concerns arise.  Patient Instructions  If worsening, fevers, persistent or large amount of blood in urine seek care immediately  We have ordered labs or studies at this visit. It can take up to 1-2 weeks for results and processing. We will contact you with instructions IF your results are abnormal. Normal results will be released to your [facility]. If you have not heard from [place] or can not find your results in [facility] in 2 weeks please contact our office.  -As we discussed, we have prescribed a new medication for you at this appointment. We discussed the common and serious potential adverse effects of this medication and you can review these and more with the pharmacist when you pick up your medication.  Please follow the instructions for use carefully and notify us immediately if you have any problems taking this medication.             Colin Benton R.

## 2015-03-03 NOTE — Addendum Note (Signed)
Addended by: Agnes Lawrence on: 03/03/2015 11:21 AM   Modules accepted: Orders

## 2015-03-03 NOTE — Patient Instructions (Signed)
If worsening, fevers, persistent or large amount of blood in urine seek care immediately  We have ordered labs or studies at this visit. It can take up to 1-2 weeks for results and processing. We will contact you with instructions IF your results are abnormal. Normal results will be released to your [facility]. If you have not heard from [place] or can not find your results in [facility] in 2 weeks please contact our office.  -As we discussed, we have prescribed a new medication for you at this appointment. We discussed the common and serious potential adverse effects of this medication and you can review these and more with the pharmacist when you pick up your medication.  Please follow the instructions for use carefully and notify us immediately if you have any problems taking this medication.

## 2015-03-06 LAB — URINE CULTURE: Colony Count: >=100000

## 2015-03-06 NOTE — Addendum Note (Signed)
Addended by: Agnes Lawrence on: 03/06/2015 05:22 PM   Modules accepted: Orders

## 2015-03-17 ENCOUNTER — Other Ambulatory Visit (INDEPENDENT_AMBULATORY_CARE_PROVIDER_SITE_OTHER): Payer: Medicare Other

## 2015-03-17 DIAGNOSIS — R300 Dysuria: Secondary | ICD-10-CM

## 2015-03-17 LAB — URINALYSIS, MICROSCOPIC ONLY

## 2015-05-03 ENCOUNTER — Ambulatory Visit (INDEPENDENT_AMBULATORY_CARE_PROVIDER_SITE_OTHER): Payer: Medicare Other | Admitting: Podiatry

## 2015-05-03 ENCOUNTER — Encounter: Payer: Self-pay | Admitting: Podiatry

## 2015-05-03 DIAGNOSIS — B351 Tinea unguium: Secondary | ICD-10-CM

## 2015-05-03 DIAGNOSIS — M79676 Pain in unspecified toe(s): Secondary | ICD-10-CM

## 2015-05-04 NOTE — Progress Notes (Signed)
Patient ID: Shelley Robinson, female   DOB: [birth year], 81 y.o.   MRN: [id number]  Subjective: This patient presents today at her request complaining of painful toenails when she wears shoes and is requesting nail debridement. The last visit for a similar service was on 08/12/2014  Objective: Orientated 3 No open skin lesions bilaterally The toenails are riddled, elongated, hypertrophic, discolored and tender direct palpation 6-10  Assessment: Symptomatic onychomycoses 6-10  Plan: Debridement toenails 10 mechanically and electrically without any bleeding  Reappoint when necessary or at three-month intervals

## 2015-05-09 ENCOUNTER — Ambulatory Visit (INDEPENDENT_AMBULATORY_CARE_PROVIDER_SITE_OTHER): Payer: Medicare Other | Admitting: Internal Medicine

## 2015-05-09 ENCOUNTER — Encounter: Payer: Self-pay | Admitting: Internal Medicine

## 2015-05-09 VITALS — BP 138/90 | HR 88 | Temp 98.5°F | Resp 20 | Ht 61.0 in | Wt 189.0 lb

## 2015-05-09 DIAGNOSIS — Z23 Encounter for immunization: Secondary | ICD-10-CM | POA: Diagnosis not present

## 2015-05-09 DIAGNOSIS — E785 Hyperlipidemia, unspecified: Secondary | ICD-10-CM

## 2015-05-09 DIAGNOSIS — I10 Essential (primary) hypertension: Secondary | ICD-10-CM | POA: Diagnosis not present

## 2015-05-09 MED ORDER — ROSUVASTATIN CALCIUM 10 MG PO TABS: 10.0000 mg | ORAL_TABLET | Freq: Every day | ORAL | Status: DC

## 2015-05-09 MED ORDER — TRAZODONE HCL 50 MG PO TABS: 25.0000 mg | ORAL_TABLET | Freq: Every evening | ORAL | Status: DC | PRN

## 2015-05-09 MED ORDER — OMEPRAZOLE 20 MG PO CPDR: DELAYED_RELEASE_CAPSULE | ORAL | Status: DC

## 2015-05-09 MED ORDER — HYDROCHLOROTHIAZIDE 25 MG PO TABS: 25.0000 mg | ORAL_TABLET | Freq: Every day | ORAL | Status: DC

## 2015-05-09 NOTE — Progress Notes (Signed)
Pre visit review using our clinic review tool, if applicable. No additional management support is needed unless otherwise documented below in the visit note. 

## 2015-05-09 NOTE — Patient Instructions (Signed)
Limit your sodium (Salt) intake  Return in 6 months for follow-up  

## 2015-05-09 NOTE — Progress Notes (Signed)
Subjective:    Patient ID: Shelley Robinson, female    DOB: 09-Jan-1927, 81 y.o.   MRN: [id number]  HPI  81 year old patient who is seen today for her biannual follow-up and also for a flu vaccine.  She is doing quite well.  She was seen 3 months ago for a UTI.  Today she feels well without complaints. Denies any cardiopulmonary complaints  Past Medical History  Diagnosis Date  . Hypertension   . GERD (gastroesophageal reflux disease)   . Arthritis     Social History   Social History  . Marital Status: Widowed    Spouse Name: N/A  . Number of Children: N/A  . Years of Education: N/A   Occupational History  . Not on file.   Social History Main Topics  . Smoking status: Former Smoker    Quit date: 07/08/1978  . Smokeless tobacco: Not on file  . Alcohol Use: 0.6 oz/week    1 Glasses of wine per week     Comment: once or twice a month  . Drug Use: Not on file  . Sexual Activity: Not on file   Other Topics Concern  . Not on file   Social History Narrative    Past Surgical History  Procedure Laterality Date  . Cholecystectomy    . Abdominal hysterectomy    . Joint replacement    . Tonsillectomy      No family history on file.  Allergies  Allergen Reactions  . Lisinopril Cough  . Piroxicam Other (See Comments)    unknown  . Prednisone Other (See Comments)    hypertensive  . Penicillins Rash    Current Outpatient Prescriptions on File Prior to Visit  Medication Sig Dispense Refill  . acetaminophen (TYLENOL) 500 MG tablet Take 1 tablet (500 mg total) by mouth every 6 (six) hours as needed for pain. Take one  in AM and one at qhs, and one at noon if needed 30 tablet 0  . aspirin 81 MG EC tablet Take 81 mg by mouth daily.     . calcium-vitamin D (OSCAL WITH D 500-200) 500-200 MG-UNIT per tablet Take 1 tablet by mouth daily.     Marland Kitchen conjugated estrogens (PREMARIN) vaginal cream Place 1 Applicatorful vaginally as needed.    . fish oil-omega-3 fatty acids 1000  MG capsule Take 1 g by mouth daily.      . hydrochlorothiazide (HYDRODIURIL) 25 MG tablet Take 1 tablet (25 mg total) by mouth daily. 90 tablet 3  . loratadine (CLARITIN) 10 MG tablet Take 10 mg by mouth daily. Not sure of the mg/lc    . Multiple Vitamins-Minerals (PRESERVISION AREDS 2 PO) Take 1 tablet by mouth daily.    . mupirocin ointment (BACTROBAN) 2 % Apply 1 application topically 2 (two) times daily as needed. 30 g 5  . omeprazole (PRILOSEC) 20 MG capsule TAKE ONE CAPSULE (20 MG TOTAL) BY MOUTH TWO TIMES DAILY. 180 capsule 3  . rosuvastatin (CRESTOR) 10 MG tablet Take 1 tablet (10 mg total) by mouth daily. 90 tablet 3  . traZODone (DESYREL) 50 MG tablet Take 0.5-1 tablets (25-50 mg total) by mouth at bedtime as needed for sleep. 30 tablet 5  . Triamcinolone Acetonide (NASACORT ALLERGY 24HR NA) Place 1 spray into the nose daily.    . valACYclovir (VALTREX) 1000 MG tablet TAKE 1 TABLET (1,000 MG TOTAL) BY MOUTH 2 (TWO) TIMES DAILY. FOR 1 DAY 30 tablet 1  . diphenhydramine-acetaminophen (TYLENOL PM) 25-500 MG  TABS Take 1 tablet by mouth at bedtime as needed. For sleep     No current facility-administered medications on file prior to visit.    BP 138/90 mmHg  Pulse 88  Temp(Src) 98.5 F (36.9 C) (Oral)  Resp 20  Ht 5\' 1"  (1.549 m)  Wt 189 lb (85.73 kg)  BMI 35.73 kg/m2  SpO2 95%     Review of Systems  Constitutional: Negative.   HENT: Negative for congestion, dental problem, hearing loss, rhinorrhea, sinus pressure, sore throat and tinnitus.   Eyes: Negative for pain, discharge and visual disturbance.  Respiratory: Negative for cough and shortness of breath.   Cardiovascular: Negative for chest pain, palpitations and leg swelling.  Gastrointestinal: Negative for nausea, vomiting, abdominal pain, diarrhea, constipation, blood in stool and abdominal distention.  Genitourinary: Negative for dysuria, urgency, frequency, hematuria, flank pain, vaginal bleeding, vaginal discharge,  difficulty urinating, vaginal pain and pelvic pain.  Musculoskeletal: Negative for joint swelling, arthralgias and gait problem.  Skin: Negative for rash.  Neurological: Negative for dizziness, syncope, speech difficulty, weakness, numbness and headaches.  Hematological: Negative for adenopathy.  Psychiatric/Behavioral: Negative for behavioral problems, dysphoric mood and agitation. The patient is not nervous/anxious.        Objective:   Physical Exam  Constitutional: She is oriented to person, place, and time. She appears well-developed and well-nourished.  Repeat blood pressure 120/84  HENT:  Head: Normocephalic.  Right Ear: External ear normal.  Left Ear: External ear normal.  Mouth/Throat: Oropharynx is clear and moist.  Eyes: Conjunctivae and EOM are normal. Pupils are equal, round, and reactive to light.  Neck: Normal range of motion. Neck supple. No thyromegaly present.  Cardiovascular: Normal rate, regular rhythm, normal heart sounds and intact distal pulses.   Pulmonary/Chest: Effort normal and breath sounds normal.  Abdominal: Soft. Bowel sounds are normal. She exhibits no mass. There is no tenderness.  Musculoskeletal: Normal range of motion.  Lymphadenopathy:    She has no cervical adenopathy.  Neurological: She is alert and oriented to person, place, and time.  Skin: Skin is warm and dry. No rash noted.  Psychiatric: She has a normal mood and affect. Her behavior is normal.          Assessment & Plan:   Hypertension, stable.  No change in therapy Dyslipidemia.  Continue statin therapy Osteoarthritis  CPX 6 months Preventive health.  Flu vaccine administered

## 2015-05-19 ENCOUNTER — Ambulatory Visit: Payer: Medicare Other | Admitting: Internal Medicine

## 2015-08-09 ENCOUNTER — Ambulatory Visit (INDEPENDENT_AMBULATORY_CARE_PROVIDER_SITE_OTHER): Payer: Medicare Other | Admitting: Podiatry

## 2015-08-09 ENCOUNTER — Encounter: Payer: Self-pay | Admitting: Podiatry

## 2015-08-09 DIAGNOSIS — M79676 Pain in unspecified toe(s): Secondary | ICD-10-CM

## 2015-08-09 DIAGNOSIS — B351 Tinea unguium: Secondary | ICD-10-CM | POA: Diagnosis not present

## 2015-08-10 ENCOUNTER — Other Ambulatory Visit: Payer: Self-pay | Admitting: Internal Medicine

## 2015-08-10 NOTE — Progress Notes (Signed)
Patient ID: Shelley Robinson, female   DOB: 11-Nov-1926, 82 y.o.   MRN: [id number]  Subjective: This patient presents again today for scheduled visit complaining of thickened and elongated toenails which are comfortable and walking wearing shoes and requests toenail debridement  Objective: Orientated 3 No open skin lesions bilaterally The toenails are hypertrophic, elongated, discolored, deformed and tender to direct palpation 6-10  Assessment: Symptomatic onychomycoses 6-10  Plan: Debridement toenails 6-10 mechanically and electronically without any bleeding  Reappoint 3 month

## 2015-08-25 DIAGNOSIS — H02054 Trichiasis without entropian left upper eyelid: Secondary | ICD-10-CM | POA: Diagnosis not present

## 2015-08-25 DIAGNOSIS — H02051 Trichiasis without entropian right upper eyelid: Secondary | ICD-10-CM | POA: Diagnosis not present

## 2015-08-25 DIAGNOSIS — H353222 Exudative age-related macular degeneration, left eye, with inactive choroidal neovascularization: Secondary | ICD-10-CM | POA: Diagnosis not present

## 2015-08-31 DIAGNOSIS — H353212 Exudative age-related macular degeneration, right eye, with inactive choroidal neovascularization: Secondary | ICD-10-CM | POA: Diagnosis not present

## 2015-08-31 DIAGNOSIS — H353221 Exudative age-related macular degeneration, left eye, with active choroidal neovascularization: Secondary | ICD-10-CM | POA: Diagnosis not present

## 2015-08-31 DIAGNOSIS — D3131 Benign neoplasm of right choroid: Secondary | ICD-10-CM | POA: Diagnosis not present

## 2015-08-31 DIAGNOSIS — D3132 Benign neoplasm of left choroid: Secondary | ICD-10-CM | POA: Diagnosis not present

## 2015-09-22 ENCOUNTER — Encounter: Payer: Self-pay | Admitting: Family Medicine

## 2015-09-22 ENCOUNTER — Ambulatory Visit (INDEPENDENT_AMBULATORY_CARE_PROVIDER_SITE_OTHER): Payer: Medicare Other | Admitting: Family Medicine

## 2015-09-22 VITALS — BP 152/88 | HR 87 | Temp 98.0°F | Ht 61.0 in | Wt 188.1 lb

## 2015-09-22 DIAGNOSIS — I10 Essential (primary) hypertension: Secondary | ICD-10-CM | POA: Diagnosis not present

## 2015-09-22 DIAGNOSIS — M546 Pain in thoracic spine: Secondary | ICD-10-CM | POA: Diagnosis not present

## 2015-09-22 DIAGNOSIS — R21 Rash and other nonspecific skin eruption: Secondary | ICD-10-CM | POA: Diagnosis not present

## 2015-09-22 MED ORDER — TRIAMCINOLONE ACETONIDE 0.1 % EX CREA: 1.0000 "application " | TOPICAL_CREAM | Freq: Two times a day (BID) | CUTANEOUS | Status: DC

## 2015-09-22 NOTE — Patient Instructions (Addendum)
Please keep your physical scheduled with her primary doctor.  Please try the steroid cream 1-2 times daily for 1 week on the rash, and eliminate the metal neck was. Please follow up if your rash persists.  May use heat and Tylenol 500 mg up to 3 times daily for the muscle soreness. These follow up with your doctor if this persists or worsens or new symptoms develop.

## 2015-09-22 NOTE — Progress Notes (Signed)
HPI:  Shelley Robinson is a pleasant 82 year old here for an acute visit for several issues. She is wearing a necklace around her neck and her ADT and has noticed an itchy rash in this area for a few weeks. She denies any history of allergies to medicines. She does have a history of eczema and has dry itchy skin elsewhere. She also for a muscle in her back. Thinks she slept wrong. Has had moderate achy pain in the right low to mid back for a few days that is worse with certain movements. Wonders if she can take Tylenol for this.    ROS: See pertinent positives and negatives per HPI.  Past Medical History  Diagnosis Date  . Hypertension   . GERD (gastroesophageal reflux disease)   . Arthritis     Past Surgical History  Procedure Laterality Date  . Cholecystectomy    . Abdominal hysterectomy    . Joint replacement    . Tonsillectomy      No family history on file.  Social History   Social History  . Marital Status: Widowed    Spouse Name: N/A  . Number of Children: N/A  . Years of Education: N/A   Social History Main Topics  . Smoking status: Former Smoker    Quit date: 07/08/1978  . Smokeless tobacco: None  . Alcohol Use: 0.6 oz/week    1 Glasses of wine per week     Comment: once or twice a month  . Drug Use: None  . Sexual Activity: Not Asked   Other Topics Concern  . None   Social History Narrative     Current outpatient prescriptions:  .  acetaminophen (TYLENOL) 500 MG tablet, Take 1 tablet (500 mg total) by mouth every 6 (six) hours as needed for pain. Take one  in AM and one at qhs, and one at noon if needed, Disp: 30 tablet, Rfl: 0 .  aspirin 81 MG EC tablet, Take 81 mg by mouth daily. , Disp: , Rfl:  .  calcium-vitamin D (OSCAL WITH D 500-200) 500-200 MG-UNIT per tablet, Take 1 tablet by mouth daily. , Disp: , Rfl:  .  conjugated estrogens (PREMARIN) vaginal cream, Place 1 Applicatorful vaginally as needed., Disp: , Rfl:  .  hydrochlorothiazide  (HYDRODIURIL) 25 MG tablet, Take 1 tablet (25 mg total) by mouth daily., Disp: 90 tablet, Rfl: 3 .  loratadine (CLARITIN) 10 MG tablet, Take 10 mg by mouth daily. Not sure of the mg/lc, Disp: , Rfl:  .  Multiple Vitamins-Minerals (PRESERVISION AREDS 2 PO), Take 1 tablet by mouth daily., Disp: , Rfl:  .  mupirocin ointment (BACTROBAN) 2 %, Apply 1 application topically 2 (two) times daily as needed., Disp: 30 g, Rfl: 5 .  omeprazole (PRILOSEC) 20 MG capsule, TAKE ONE CAPSULE (20 MG TOTAL) BY MOUTH TWO TIMES DAILY., Disp: 180 capsule, Rfl: 3 .  rosuvastatin (CRESTOR) 10 MG tablet, Take 1 tablet (10 mg total) by mouth daily., Disp: 90 tablet, Rfl: 3 .  traZODone (DESYREL) 50 MG tablet, TAKE 0.5-1 TABLETS (25-50 MG TOTAL) BY MOUTH AT BEDTIME AS NEEDED FOR SLEEP., Disp: 90 tablet, Rfl: 1 .  Triamcinolone Acetonide (NASACORT ALLERGY 24HR NA), Place 1 spray into the nose daily., Disp: , Rfl:  .  valACYclovir (VALTREX) 1000 MG tablet, TAKE 1 TABLET (1,000 MG TOTAL) BY MOUTH 2 (TWO) TIMES DAILY. FOR 1 DAY, Disp: 30 tablet, Rfl: 1 .  triamcinolone cream (KENALOG) 0.1 %, Apply 1 application topically 2 (  two) times daily., Disp: 80 g, Rfl: 0  EXAM:  Filed Vitals:   09/22/15 1350  BP: 152/88  Pulse: 87  Temp: 98 F (36.7 C)    Body mass index is 35.56 kg/(m^2).  GENERAL: vitals reviewed and listed above, alert, oriented, appears well hydrated and in no acute distress  HEENT: atraumatic, conjunttiva clear, no obvious abnormalities on inspection of external nose and ears  NECK: no obvious masses on inspection  LUNGS: clear to auscultation bilaterally, no wheezes, rales or rhonchi, good air movement  CV: HRRR, no peripheral edema  SKIN: Erythematous scattered papules in this region chest and upper back. Dry skin elsewhere.  MS: moves all extremities without noticeable abnormality, walks with a walker, tenderness to palpation in the muscles of the back right mid back.  PSYCH: pleasant and  cooperative, no obvious depression or anxiety  ASSESSMENT AND PLAN:  Discussed the following assessment and plan:  Rash and nonspecific skin eruption Suspect contact dermatitis, advised trial different non-mental necklaces and triamcinolone cream. Follow-up as needed.  Right-sided thoracic back pain Suspect muscular given reproducible symptoms on exam. Treat with heat, gentle stretching and Tylenol. Safe dosing discussed.  Essential hypertension Blood pressure mildly up on intake. She thinks this is due to the muscle strain pain. Wishes to recheck when she follows up per physical with her primary doctor.  -Patient advised to return or notify a doctor immediately if symptoms worsen or persist or new concerns arise.  Patient Instructions  Please keep your physical scheduled with her primary doctor.  Please try the steroid cream 1-2 times daily for 1 week on the rash, and eliminate the metal neck was. Please follow up if your rash persists.  May use heat and Tylenol 500 mg up to 3 times daily for the muscle soreness. These follow up with your doctor if this persists or worsens or new symptoms develop.    Colin Benton R.

## 2015-09-22 NOTE — Progress Notes (Signed)
Pre visit review using our clinic review tool, if applicable. No additional management support is needed unless otherwise documented below in the visit note. 

## 2015-10-05 DIAGNOSIS — H353221 Exudative age-related macular degeneration, left eye, with active choroidal neovascularization: Secondary | ICD-10-CM | POA: Diagnosis not present

## 2015-10-12 DIAGNOSIS — H353221 Exudative age-related macular degeneration, left eye, with active choroidal neovascularization: Secondary | ICD-10-CM | POA: Diagnosis not present

## 2015-10-12 DIAGNOSIS — H1132 Conjunctival hemorrhage, left eye: Secondary | ICD-10-CM | POA: Diagnosis not present

## 2015-11-14 DIAGNOSIS — H353221 Exudative age-related macular degeneration, left eye, with active choroidal neovascularization: Secondary | ICD-10-CM | POA: Diagnosis not present

## 2015-11-15 ENCOUNTER — Ambulatory Visit (INDEPENDENT_AMBULATORY_CARE_PROVIDER_SITE_OTHER): Payer: Medicare Other | Admitting: Podiatry

## 2015-11-15 ENCOUNTER — Encounter: Payer: Self-pay | Admitting: Podiatry

## 2015-11-15 DIAGNOSIS — B351 Tinea unguium: Secondary | ICD-10-CM | POA: Diagnosis not present

## 2015-11-15 DIAGNOSIS — M79676 Pain in unspecified toe(s): Secondary | ICD-10-CM | POA: Diagnosis not present

## 2015-11-15 NOTE — Progress Notes (Signed)
Patient ID: Shelley Robinson, female   DOB: Jun 28, 1927, 82 y.o.   MRN: [id number]  Subjective: This patient presents again today for scheduled visit complaining of thickened and elongated toenails which are comfortable and walking wearing shoes and requests toenail debridement  Objective: Orientated 3 No open skin lesions bilaterally The toenails are hypertrophic, elongated, discolored, deformed and tender to direct palpation 6-10  Assessment: Symptomatic onychomycoses 6-10  Plan: Debridement toenails 6-10 mechanically and electronically without any bleeding  Reappoint 3 month

## 2015-11-21 ENCOUNTER — Ambulatory Visit (INDEPENDENT_AMBULATORY_CARE_PROVIDER_SITE_OTHER): Payer: Medicare Other | Admitting: Internal Medicine

## 2015-11-21 ENCOUNTER — Encounter: Payer: Self-pay | Admitting: Internal Medicine

## 2015-11-21 VITALS — BP 122/74 | HR 67 | Temp 98.1°F | Resp 20 | Ht 60.5 in | Wt 185.0 lb

## 2015-11-21 DIAGNOSIS — M150 Primary generalized (osteo)arthritis: Secondary | ICD-10-CM

## 2015-11-21 DIAGNOSIS — I10 Essential (primary) hypertension: Secondary | ICD-10-CM | POA: Diagnosis not present

## 2015-11-21 DIAGNOSIS — Z0000 Encounter for general adult medical examination without abnormal findings: Secondary | ICD-10-CM | POA: Diagnosis not present

## 2015-11-21 DIAGNOSIS — E785 Hyperlipidemia, unspecified: Secondary | ICD-10-CM | POA: Diagnosis not present

## 2015-11-21 DIAGNOSIS — M159 Polyosteoarthritis, unspecified: Secondary | ICD-10-CM

## 2015-11-21 LAB — CBC WITH DIFFERENTIAL/PLATELET
BASOS ABS: 0 10*3/uL (ref 0.0–0.1)
Basophils Relative: 0.3 % (ref 0.0–3.0)
EOS ABS: 0.1 10*3/uL (ref 0.0–0.7)
Eosinophils Relative: 2.1 % (ref 0.0–5.0)
HEMATOCRIT: 45.3 % (ref 36.0–46.0)
Hemoglobin: 15.2 g/dL — ABNORMAL HIGH (ref 12.0–15.0)
LYMPHS PCT: 24.7 % (ref 12.0–46.0)
Lymphs Abs: 1.2 10*3/uL (ref 0.7–4.0)
MCHC: 33.6 g/dL (ref 30.0–36.0)
MCV: 96.2 fl (ref 78.0–100.0)
MONOS PCT: 9.4 % (ref 3.0–12.0)
Monocytes Absolute: 0.5 10*3/uL (ref 0.1–1.0)
NEUTROS ABS: 3.2 10*3/uL (ref 1.4–7.7)
NEUTROS PCT: 63.5 % (ref 43.0–77.0)
PLATELETS: 184 10*3/uL (ref 150.0–400.0)
RBC: 4.71 Mil/uL (ref 3.87–5.11)
RDW: 13.5 % (ref 11.5–15.5)
WBC: 5 10*3/uL (ref 4.0–10.5)

## 2015-11-21 LAB — COMPREHENSIVE METABOLIC PANEL
ALT: 17 U/L (ref 0–35)
AST: 21 U/L (ref 0–37)
Albumin: 3.9 g/dL (ref 3.5–5.2)
Alkaline Phosphatase: 96 U/L (ref 39–117)
BUN: 26 mg/dL — ABNORMAL HIGH (ref 6–23)
CHLORIDE: 104 meq/L (ref 96–112)
CO2: 30 mEq/L (ref 19–32)
Calcium: 9.4 mg/dL (ref 8.4–10.5)
Creatinine, Ser: 1.24 mg/dL — ABNORMAL HIGH (ref 0.40–1.20)
GFR: 43.49 mL/min — AB (ref >60.00)
GLUCOSE: 98 mg/dL (ref 70–99)
POTASSIUM: 4 meq/L (ref 3.5–5.1)
Sodium: 142 mEq/L (ref 135–145)
Total Bilirubin: 0.5 mg/dL (ref 0.2–1.2)
Total Protein: 6.7 g/dL (ref 6.0–8.3)

## 2015-11-21 LAB — LIPID PANEL
CHOL/HDL RATIO: 3
Cholesterol: 201 mg/dL — ABNORMAL HIGH (ref 0–200)
HDL: 58.6 mg/dL (ref >39.00)
LDL CALC: 110 mg/dL — AB (ref 0–99)
NONHDL: 142.8
Triglycerides: 164 mg/dL — ABNORMAL HIGH (ref 0.0–149.0)
VLDL: 32.8 mg/dL (ref 0.0–40.0)

## 2015-11-21 LAB — TSH: TSH: 2.79 u[IU]/mL (ref 0.35–4.50)

## 2015-11-21 NOTE — Progress Notes (Signed)
Pre visit review using our clinic review tool, if applicable. No additional management support is needed unless otherwise documented below in the visit note. 

## 2015-11-21 NOTE — Progress Notes (Signed)
Patient ID: SURY FIDALGO, female   DOB: August 11, 1926, 82 y.o.   MRN: [id number]  Subjective:    Patient ID: AILEAH KRIZAN, female    DOB: 1927-01-05, 82 y.o.   MRN: [id number]  HPI  CC: cpx - doing ok.   History of Present Illness:   Wt Readings from Last 3 Encounters:  11/21/15 185 lb (83.915 kg)  09/22/15 188 lb 1.6 oz (85.322 kg)  05/09/15 189 lb (85.73 kg)    82    year-old patient who is seen today for a comprehensive evaluation.  She fractured her left humerus in October of 2010 and had surgery at [facility]. She states that she had a 2-D echocardiogram at that time. She has long a long history of dyspnea on exertion. She has treated hypertension, dyslipidemia, and significant osteoarthritis. She has a history of exogenous obesity.  Problems include hypertension, dyslipidemia, and osteoarthritis  Here for Medicare AWV:   1. Risk factors based on Past M, S, F history: risk factors include hypertension, and dyslipidemia.  2. Physical Activities: very sedentary due to obesity, arthritis, and dyspnea on exertion  3. Depression/mood: history depression, or mood disorder  4. Hearing: no deficits  5. ADL's: independent in all aspects of daily living, but sedentary  6. Fall Risk: moderate due to age, obesity, arthritis; does fall occasionally.  History of fracture October 2010 - uses a walker 7. Home Safety: no problems identified  8. Height, weight, &visual acuity: there continues to be modest weight gain.  Followed by ophthalmology for macular degeneration  9. Counseling: weight loss encouraged  10. Labs ordered based on risk factors: laboratory profile, including lipid panel will be reviewed  11. Referral Coordination-  N/A 12. Care Plan- weight loss, heart healthy diet or exercise. All encouraged  13. Cognitive Assessment- alert and oriented, with a normal affect. She feels that she may have become slightly more forgetful. Handles all her executive functions without  limitations. No family concerns with memory  14.  Preventive services will include annual clinical exams with screening lab.  Annual eye examinations.  Also encouraged.  15.  Provider list updated.  Includes primary care and ophthalmology as well as podiatry   Preventive Screening-Counseling & Management  Alcohol-Tobacco  Smoking Status: quit   Allergies:  1) ! Penicillin G Pot in Dextrose (Penicillin G Potassium in D5w)  2) ! Feldene   Past History:  Past Medical History:   Hyperlipidemia  Hypertension  DJD  Low back pain  Knee pain  exogenous obesity  Osteoarthritis    Past Surgical History:   Appendectomy  Cholecystectomy  Hysterectomy  Tonsillectomy  cataract surgery OD  2 D Echo 10-11 ([facility])  colonoscopy none- prior sigmoidoscopy 2003  surgery for fracture, left humerus September 2010   Family History:    Father ded age 71: cerebrovascular disease CAD, prostate cancer  mother died at age 59, senile dementia  one sister died of a lymphoma   Social History:   3 children  [place]-October 2006    Review of Systems  Constitutional: Negative for fever, appetite change, fatigue and unexpected weight change.  HENT: Negative for congestion, dental problem, ear pain, hearing loss, mouth sores, nosebleeds, sinus pressure, sore throat, tinnitus, trouble swallowing and voice change.   Eyes: Negative for photophobia, pain, redness and visual disturbance.  Respiratory: Negative for cough, chest tightness and shortness of breath.   Cardiovascular: Negative for chest pain, palpitations and leg swelling.  Gastrointestinal: Negative for nausea, vomiting, abdominal pain,  diarrhea, constipation, blood in stool, abdominal distention and rectal pain.  Genitourinary: Negative for dysuria, urgency, frequency, hematuria, flank pain, vaginal bleeding, vaginal discharge, difficulty urinating, genital sores, vaginal pain, menstrual problem and pelvic pain.  Musculoskeletal:  Positive for back pain and gait problem. Negative for arthralgias and neck stiffness.  Skin: Negative for rash.  Neurological: Negative for dizziness, syncope, speech difficulty, weakness, light-headedness, numbness and headaches.  Hematological: Negative for adenopathy. Does not bruise/bleed easily.  Psychiatric/Behavioral: Negative for suicidal ideas, behavioral problems, self-injury, dysphoric mood and agitation. The patient is not nervous/anxious.        Objective:   Physical Exam  Constitutional: She is oriented to person, place, and time. She appears well-developed and well-nourished.  Repeat blood pressure 130/86  HENT:  Head: Normocephalic and atraumatic.  Right Ear: External ear normal.  Left Ear: External ear normal.  Mouth/Throat: Oropharynx is clear and moist.  Dentures in place  Pharyngeal crowding  Eyes: Conjunctivae and EOM are normal.  Neck: Normal range of motion. Neck supple. No JVD present. No thyromegaly present.  Cardiovascular: Normal rate, regular rhythm and normal heart sounds.   No murmur heard. Pedal pulses full, except for an absent right dorsalis pedis pulse  Pulmonary/Chest: Effort normal and breath sounds normal. She has no wheezes. She has no rales.  Kyphosis  Abdominal: Soft. Bowel sounds are normal. She exhibits no distension and no mass. There is no tenderness. There is no rebound and no guarding.  Genitourinary: Vagina normal.  Musculoskeletal: Normal range of motion. She exhibits edema. She exhibits no tenderness.  Trace lower extremity edema  Neurological: She is alert and oriented to person, place, and time. She has normal reflexes. No cranial nerve deficit. She exhibits normal muscle tone. Coordination normal.  Absent vibratory sensation distally  Skin: Skin is warm and dry. No rash noted.  Dry flaky skin of the feet especially laterally Onychomycotic toes  Psychiatric: She has a normal mood and affect. Her behavior is normal.           Assessment & Plan:   Preventive health examination Hypertension stable Exogenous obesity. Weight loss encouraged  Dyslipidemia. We'll check a lipid panel continue Crestor Osteoarthritis with chronic low back pain We'll check a  laboratory update

## 2015-11-21 NOTE — Patient Instructions (Addendum)
Limit your sodium (Salt) intake  Take a calcium supplement, plus [phone] units of vitamin D    It is important that you exercise regularly, at least 20 minutes 3 to 4 times per week.  If you develop chest pain or shortness of breath seek  medical attention.  Return in 6 months for follow-up

## 2015-11-29 ENCOUNTER — Other Ambulatory Visit: Payer: Self-pay | Admitting: Internal Medicine

## 2015-12-21 DIAGNOSIS — H353222 Exudative age-related macular degeneration, left eye, with inactive choroidal neovascularization: Secondary | ICD-10-CM | POA: Diagnosis not present

## 2016-02-07 DIAGNOSIS — H353211 Exudative age-related macular degeneration, right eye, with active choroidal neovascularization: Secondary | ICD-10-CM | POA: Diagnosis not present

## 2016-02-07 DIAGNOSIS — H353221 Exudative age-related macular degeneration, left eye, with active choroidal neovascularization: Secondary | ICD-10-CM | POA: Diagnosis not present

## 2016-02-21 ENCOUNTER — Ambulatory Visit: Payer: Medicare Other | Admitting: Podiatry

## 2016-02-23 ENCOUNTER — Other Ambulatory Visit: Payer: Self-pay | Admitting: Internal Medicine

## 2016-02-23 MED ORDER — ESOMEPRAZOLE MAGNESIUM 40 MG PO CPDR: 40.0000 mg | DELAYED_RELEASE_CAPSULE | Freq: Every day | ORAL | 1 refills | Status: DC

## 2016-02-23 NOTE — Telephone Encounter (Signed)
Rx sent in

## 2016-02-23 NOTE — Telephone Encounter (Signed)
Okay for generic Nexium but make it 40 mg rather than 20 mg

## 2016-02-23 NOTE — Telephone Encounter (Signed)
Pt states she is having severe acid reflux and the omeprazole (PRILOSEC) 20 MG capsule  Is not working.  Pt states she would like to try Nexium 20 mg.  [facility]/ [address]  Pt is leaving town on Monday for 3 weeks.

## 2016-02-28 ENCOUNTER — Telehealth: Payer: Self-pay | Admitting: Internal Medicine

## 2016-02-28 NOTE — Telephone Encounter (Signed)
°  Pt insurance will not pay for Nexium but they will pay for PROTONIX 40 MG. Pt is asking for a rx for this med   80 day supply   [facility]    [place]

## 2016-02-28 NOTE — Telephone Encounter (Signed)
OK to change?

## 2016-02-29 ENCOUNTER — Other Ambulatory Visit: Payer: Self-pay | Admitting: *Deleted

## 2016-02-29 MED ORDER — PANTOPRAZOLE SODIUM 40 MG PO TBEC: 40.0000 mg | DELAYED_RELEASE_TABLET | Freq: Every day | ORAL | 3 refills | Status: DC

## 2016-02-29 NOTE — Telephone Encounter (Signed)
Spoke to pt, told her Rx for Nexium had to be changed to Protonix due to insurance would not cover. New Rx Protonix 40 mg one tablet daily sent to pharmacy. Pt verbalized understanding.

## 2016-03-06 DIAGNOSIS — M25562 Pain in left knee: Secondary | ICD-10-CM | POA: Diagnosis not present

## 2016-03-06 DIAGNOSIS — M25561 Pain in right knee: Secondary | ICD-10-CM | POA: Diagnosis not present

## 2016-03-06 DIAGNOSIS — M170 Bilateral primary osteoarthritis of knee: Secondary | ICD-10-CM | POA: Diagnosis not present

## 2016-03-27 DIAGNOSIS — H353221 Exudative age-related macular degeneration, left eye, with active choroidal neovascularization: Secondary | ICD-10-CM | POA: Diagnosis not present

## 2016-05-13 DIAGNOSIS — Z23 Encounter for immunization: Secondary | ICD-10-CM | POA: Diagnosis not present

## 2016-05-15 DIAGNOSIS — H353221 Exudative age-related macular degeneration, left eye, with active choroidal neovascularization: Secondary | ICD-10-CM | POA: Diagnosis not present

## 2016-05-21 ENCOUNTER — Encounter: Payer: Self-pay | Admitting: Internal Medicine

## 2016-05-21 ENCOUNTER — Ambulatory Visit (INDEPENDENT_AMBULATORY_CARE_PROVIDER_SITE_OTHER): Payer: Medicare Other | Admitting: Internal Medicine

## 2016-05-21 VITALS — BP 142/74 | HR 70 | Temp 97.8°F | Ht 60.5 in | Wt 184.0 lb

## 2016-05-21 DIAGNOSIS — M150 Primary generalized (osteo)arthritis: Secondary | ICD-10-CM | POA: Diagnosis not present

## 2016-05-21 DIAGNOSIS — I10 Essential (primary) hypertension: Secondary | ICD-10-CM

## 2016-05-21 DIAGNOSIS — M159 Polyosteoarthritis, unspecified: Secondary | ICD-10-CM

## 2016-05-21 DIAGNOSIS — Z8679 Personal history of other diseases of the circulatory system: Secondary | ICD-10-CM | POA: Diagnosis not present

## 2016-05-21 DIAGNOSIS — E785 Hyperlipidemia, unspecified: Secondary | ICD-10-CM

## 2016-05-21 MED ORDER — ESTROGENS, CONJUGATED 0.625 MG/GM VA CREA: 1.0000 | TOPICAL_CREAM | VAGINAL | 3 refills | Status: DC | PRN

## 2016-05-21 NOTE — Progress Notes (Signed)
Pre visit review using our clinic review tool, if applicable. No additional management support is needed unless otherwise documented below in the visit note. 

## 2016-05-21 NOTE — Progress Notes (Signed)
Subjective:    Patient ID: Shelley Robinson, female    DOB: 1927-01-24, 82 y.o.   MRN: [id number]  HPI  82 year old patient who is seen today for her six-month follow-up.  She has a history of essential hypertension, dyslipidemia and also a history of diastolic heart failure.  Patient denies any cardiopulmonary complaints.  She has dyslipidemia and remains on statin therapy.  No concerns or complaints. Has a history of osteoarthritis and chronic low back pain which has been fairly stable.  Past Medical History:  Diagnosis Date  . Arthritis   . GERD (gastroesophageal reflux disease)   . Hypertension      Social History   Social History  . Marital status: Widowed    Spouse name: N/A  . Number of children: N/A  . Years of education: N/A   Occupational History  . Not on file.   Social History Main Topics  . Smoking status: Former Smoker    Quit date: 07/08/1978  . Smokeless tobacco: Not on file  . Alcohol use 0.6 oz/week    1 Glasses of wine per week     Comment: once or twice a month  . Drug use: Unknown  . Sexual activity: Not on file   Other Topics Concern  . Not on file   Social History Narrative  . No narrative on file    Past Surgical History:  Procedure Laterality Date  . ABDOMINAL HYSTERECTOMY    . CHOLECYSTECTOMY    . JOINT REPLACEMENT    . TONSILLECTOMY      No family history on file.  Allergies  Allergen Reactions  . Lisinopril Cough  . Piroxicam Other (See Comments)    unknown  . Prednisone Other (See Comments)    hypertensive  . Penicillins Rash    Current Outpatient Prescriptions on File Prior to Visit  Medication Sig Dispense Refill  . acetaminophen (TYLENOL) 500 MG tablet Take 1 tablet (500 mg total) by mouth every 6 (six) hours as needed for pain. Take one  in AM and one at qhs, and one at noon if needed 30 tablet 0  . aspirin 81 MG EC tablet Take 81 mg by mouth daily.     . calcium-vitamin D (OSCAL WITH D 500-200) 500-200 MG-UNIT per  tablet Take 1 tablet by mouth daily.     . hydrochlorothiazide (HYDRODIURIL) 25 MG tablet Take 1 tablet (25 mg total) by mouth daily. 90 tablet 3  . loratadine (CLARITIN) 10 MG tablet Take 10 mg by mouth daily. Not sure of the mg/lc    . Multiple Vitamins-Minerals (PRESERVISION AREDS 2 PO) Take 2 tablets by mouth daily.     . mupirocin ointment (BACTROBAN) 2 % Apply 1 application topically 2 (two) times daily as needed. 30 g 5  . pantoprazole (PROTONIX) 40 MG tablet Take 1 tablet (40 mg total) by mouth daily. 90 tablet 3  . rosuvastatin (CRESTOR) 10 MG tablet Take 1 tablet (10 mg total) by mouth daily. 90 tablet 3  . traZODone (DESYREL) 50 MG tablet TAKE 0.5-1 TABLETS (25-50 MG TOTAL) BY MOUTH AT BEDTIME AS NEEDED FOR SLEEP. 90 tablet 1  . Triamcinolone Acetonide (NASACORT ALLERGY 24HR NA) Place 1 spray into the nose daily.    Marland Kitchen triamcinolone cream (KENALOG) 0.1 % Apply 1 application topically 2 (two) times daily. 80 g 0  . valACYclovir (VALTREX) 1000 MG tablet TAKE 1 TABLET BY MOUTH TWICE A DAY FOR 1 DAY. 30 tablet 5   No current  facility-administered medications on file prior to visit.     BP (!) 142/74 (BP Location: Right Arm, Patient Position: Sitting, Cuff Size: Normal)   Pulse 70   Temp 97.8 F (36.6 C) (Oral)   Ht 5' 0.5" (1.537 m)   Wt 184 lb (83.5 kg)   SpO2 92%   BMI 35.34 kg/m     Review of Systems  Constitutional: Negative.   HENT: Negative for congestion, dental problem, hearing loss, rhinorrhea, sinus pressure, sore throat and tinnitus.   Eyes: Negative for pain, discharge and visual disturbance.  Respiratory: Negative for cough and shortness of breath.   Cardiovascular: Negative for chest pain, palpitations and leg swelling.  Gastrointestinal: Negative for abdominal distention, abdominal pain, blood in stool, constipation, diarrhea, nausea and vomiting.  Genitourinary: Negative for difficulty urinating, dysuria, flank pain, frequency, hematuria, pelvic pain, urgency,  vaginal bleeding, vaginal discharge and vaginal pain.  Musculoskeletal: Positive for arthralgias and back pain. Negative for gait problem and joint swelling.  Skin: Negative for rash.  Neurological: Negative for dizziness, syncope, speech difficulty, weakness, numbness and headaches.  Hematological: Negative for adenopathy.  Psychiatric/Behavioral: Negative for agitation, behavioral problems and dysphoric mood. The patient is not nervous/anxious.        Objective:   Physical Exam  Constitutional: She is oriented to person, place, and time. She appears well-developed and well-nourished.  Blood pressure 140/70  HENT:  Head: Normocephalic.  Right Ear: External ear normal.  Left Ear: External ear normal.  Mouth/Throat: Oropharynx is clear and moist.  Eyes: Conjunctivae and EOM are normal. Pupils are equal, round, and reactive to light.  Neck: Normal range of motion. Neck supple. No thyromegaly present.  Cardiovascular: Normal rate, regular rhythm, normal heart sounds and intact distal pulses.   Pulmonary/Chest: Effort normal and breath sounds normal.  Abdominal: Soft. Bowel sounds are normal. She exhibits no mass. There is no tenderness.  Musculoskeletal: Normal range of motion. She exhibits no edema.  Lymphadenopathy:    She has no cervical adenopathy.  Neurological: She is alert and oriented to person, place, and time.  Skin: Skin is warm and dry. No rash noted.  Psychiatric: She has a normal mood and affect. Her behavior is normal.          Assessment & Plan:   Essential hypertension.  Well-controlled.  No change in medical regimen Dyslipidemia.  Continue statin therapy Osteoarthritis with low back pain.  Stable History diastolic heart failure, compensated  No change in medical regimen Medicines updated CPX 6 months  Ilisa Hayworth [name]

## 2016-05-21 NOTE — Patient Instructions (Signed)
Limit your sodium (Salt) intake Take a calcium supplement, plus [phone] units of vitamin D  Return in 6 months for follow-up

## 2016-05-22 ENCOUNTER — Ambulatory Visit: Payer: Medicare Other | Admitting: Internal Medicine

## 2016-06-11 ENCOUNTER — Telehealth: Payer: Self-pay | Admitting: Internal Medicine

## 2016-06-11 NOTE — Telephone Encounter (Signed)
[facility]  Patient Name: TAJ ARTEAGA  DOB: Aug 25, 1926    Initial Comment Caller states she was spotting and had a small lump burst inside the vagina   Nurse Assessment  Nurse: Harlow Mares, RN, Suanne Marker Date/Time Eilene Ghazi Time): 06/11/2016 3:02:44 PM  Confirm and document reason for call. If symptomatic, describe symptoms. ---Caller states she was spotting and had a small lump burst inside the vagina yesterday, slight pink spotting. Reports that she did pass a small blood clot. Just inside the labia. She continued to spot an hour after the lump burst. No longer bleeding. No foul odor. Denies pain but reports that she is a little sore, slightly uncomfortable with sitting. No burning with urination.  Does the patient have any new or worsening symptoms? ---Yes  Will a triage be completed? ---Yes  Related visit to physician within the last 2 weeks? ---No  Does the PT have any chronic conditions? (i.e. diabetes, asthma, etc.) ---Yes  List chronic conditions. ---HTN  Is this a behavioral health or substance abuse call? ---No     Guidelines    Guideline Title Affirmed Question Affirmed Notes  Vulvar Symptoms Genital area looks infected (e.g., draining sore, spreading redness)    Final Disposition User   See Physician within 24 Hours [place], RN, Suanne Marker    Comments  Caller scheduled with Dr. Betty Martinique for MD appt tomorrow 06/12/16 at 10am at the [place] location.   Referrals  REFERRED TO PCP OFFICE   Disagree/Comply: Comply

## 2016-06-11 NOTE — Telephone Encounter (Signed)
Noted  

## 2016-06-12 ENCOUNTER — Encounter: Payer: Self-pay | Admitting: Family Medicine

## 2016-06-12 ENCOUNTER — Ambulatory Visit (INDEPENDENT_AMBULATORY_CARE_PROVIDER_SITE_OTHER): Payer: Medicare Other | Admitting: Family Medicine

## 2016-06-12 VITALS — BP 144/90 | HR 98 | Resp 12 | Ht 60.5 in | Wt 181.1 lb

## 2016-06-12 DIAGNOSIS — N751 Abscess of Bartholin's gland: Secondary | ICD-10-CM

## 2016-06-12 MED ORDER — SULFAMETHOXAZOLE-TRIMETHOPRIM 800-160 MG PO TABS: 1.0000 | ORAL_TABLET | Freq: Two times a day (BID) | ORAL | 0 refills | Status: AC

## 2016-06-12 NOTE — Progress Notes (Signed)
Pre visit review using our clinic review tool, if applicable. No additional management support is needed unless otherwise documented below in the visit note. 

## 2016-06-12 NOTE — Patient Instructions (Signed)
  [name] I have seen you today for an acute visit.  1. Bartholin's gland abscess  - sulfamethoxazole-trimethoprim (BACTRIM DS,SEPTRA DS) 800-160 MG tablet; Take 1 tablet by mouth 2 (two) times daily.  Dispense: 14 tablet; Refill: 0 - Ambulatory referral to Gynecology    In general please monitor for signs of worsening symptoms and seek immediate medical attention if any concerning/warning symptom as we discussed: fever, chills, abdominal pain, redness.  Appointment with gynecologists will be arranged. Sitz baths.  Monitor blood pressure at home and take medication around same time.    Please be sure you have an appointment already scheduled with your PCP before you leave today.

## 2016-06-12 NOTE — Progress Notes (Signed)
HPI:  ACUTE VISIT:  Chief Complaint  Patient presents with  . vulvar cyst    [name] is a 82 y.o. female, who is here today complaining of vaginal spotting and lesion. According to patient about 3 days ago she noted some "pinkish" on toilet paper of wiping, she explored vaginal area with finger and felt a lesion, noted dry blood and a small "blood clot."  She states that lesion is not painful. She has not noted fever, chills, abdominal pain, nausea, vomiting, dysuria, gross hematuria, or vaginal discharge. She has history of lower back pain, unchanged. She is not sure if the lesion has decreased in size. She has not used any OTC treatment.    Review of Systems  Constitutional: Negative for appetite change, chills, fatigue and fever.  Gastrointestinal: Negative for abdominal pain, blood in stool, nausea and vomiting.  Genitourinary: Positive for vaginal bleeding. Negative for dysuria, hematuria, pelvic pain, vaginal discharge and vaginal pain.  Musculoskeletal: Positive for back pain.  Psychiatric/Behavioral: Negative for confusion.      Current Outpatient Prescriptions on File Prior to Visit  Medication Sig Dispense Refill  . acetaminophen (TYLENOL) 500 MG tablet Take 1 tablet (500 mg total) by mouth every 6 (six) hours as needed for pain. Take one  in AM and one at qhs, and one at noon if needed 30 tablet 0  . aspirin 81 MG EC tablet Take 81 mg by mouth daily.     . calcium-vitamin D (OSCAL WITH D 500-200) 500-200 MG-UNIT per tablet Take 1 tablet by mouth daily.     Marland Kitchen conjugated estrogens (PREMARIN) vaginal cream Place 1 Applicatorful vaginally as needed. 42.5 g 3  . diphenhydramine-acetaminophen (TYLENOL PM) 25-500 MG TABS tablet Take 1 tablet by mouth at bedtime as needed.    . hydrochlorothiazide (HYDRODIURIL) 25 MG tablet Take 1 tablet (25 mg total) by mouth daily. 90 tablet 3  . loratadine (CLARITIN) 10 MG tablet Take 10 mg by mouth daily. Not sure of  the mg/lc    . Multiple Vitamins-Minerals (PRESERVISION AREDS 2 PO) Take 2 tablets by mouth daily.     . mupirocin ointment (BACTROBAN) 2 % Apply 1 application topically 2 (two) times daily as needed. 30 g 5  . pantoprazole (PROTONIX) 40 MG tablet Take 1 tablet (40 mg total) by mouth daily. 90 tablet 3  . rosuvastatin (CRESTOR) 10 MG tablet Take 1 tablet (10 mg total) by mouth daily. 90 tablet 3  . traZODone (DESYREL) 50 MG tablet TAKE 0.5-1 TABLETS (25-50 MG TOTAL) BY MOUTH AT BEDTIME AS NEEDED FOR SLEEP. 90 tablet 1  . Triamcinolone Acetonide (NASACORT ALLERGY 24HR NA) Place 1 spray into the nose daily.    Marland Kitchen triamcinolone cream (KENALOG) 0.1 % Apply 1 application topically 2 (two) times daily. 80 g 0  . valACYclovir (VALTREX) 1000 MG tablet TAKE 1 TABLET BY MOUTH TWICE A DAY FOR 1 DAY. 30 tablet 5   No current facility-administered medications on file prior to visit.      Past Medical History:  Diagnosis Date  . Arthritis   . GERD (gastroesophageal reflux disease)   . Hypertension    Allergies  Allergen Reactions  . Lisinopril Cough  . Piroxicam Other (See Comments)    unknown  . Prednisone Other (See Comments)    hypertensive  . Penicillins Rash    Social History   Social History  . Marital status: Widowed    Spouse name: N/A  . Number  of children: N/A  . Years of education: N/A   Social History Main Topics  . Smoking status: Former Smoker    Quit date: 07/08/1978  . Smokeless tobacco: None  . Alcohol use 0.6 oz/week    1 Glasses of wine per week     Comment: once or twice a month  . Drug use: Unknown  . Sexual activity: Not Asked   Other Topics Concern  . None   Social History Narrative  . None    Vitals:   06/12/16 0951  BP: (!) 144/90  Pulse: 98  Resp: 12   Body mass index is 34.79 kg/m.      Physical Exam  Nursing note and vitals reviewed. Constitutional: She is oriented to person, place, and time. She appears well-developed. She does not  appear ill. No distress.  HENT:  Head: Atraumatic.  Eyes: Conjunctivae are normal.  Respiratory: Effort normal. No respiratory distress.  GI: Soft. She exhibits no mass. There is no tenderness.  Genitourinary:    No bleeding in the vagina. No foreign body in the vagina. No vaginal discharge found.  Genitourinary Comments: Difficult examination: right side vaginal introitus and under minor labia induration , about 3 cm, erythema, and tenderness. 1-2 cm nodular hard lesion. Whitish vaginal mucosa, I do not appreciate ulcers.  Musculoskeletal: She exhibits no edema.  Lymphadenopathy:       Right: No inguinal adenopathy present.  Neurological: She is alert and oriented to person, place, and time. Coordination normal.  Stable gait with walker.  Skin: Skin is warm. No rash noted.  Psychiatric: She has a normal mood and affect.  Well groomed, good eye contact.      ASSESSMENT AND PLAN:     Shelley Robinson was seen today for vulvar cyst.  Diagnoses and all orders for this visit:  Bartholin's gland abscess -     sulfamethoxazole-trimethoprim (BACTRIM DS,SEPTRA DS) 800-160 MG tablet; Take 1 tablet by mouth 2 (two) times daily. -     Ambulatory referral to Gynecology   Examination was difficult because she does not tolerate supine position due to history of vertigo,so I could not perform speculum exam to inspect vagina and cervix and due to body habits vulvar inspection was limited. It seems to be mild inflammation, I recommend abx treatment and baths in warm water with epson salt a couple times per day.   Appt with gyn will be arrange for follow up. Clearly instructed about warning signs and some side effects of abx treatment discussed.   -BP 144/90  today, she has not taken her antihypertensive medication today. Recommend monitoring BP at home.   Return if symptoms worsen or fail to improve, for Gyn appt will be arranged.     -[name] was advised to return or notify a  doctor immediately if symptoms worsen or new concerns arise, She voices understanding.       America Sandall G. Martinique, MD  [facility]. [place] office.

## 2016-06-13 ENCOUNTER — Ambulatory Visit (INDEPENDENT_AMBULATORY_CARE_PROVIDER_SITE_OTHER): Payer: Medicare Other | Admitting: Obstetrics and Gynecology

## 2016-06-13 ENCOUNTER — Encounter: Payer: Self-pay | Admitting: Obstetrics and Gynecology

## 2016-06-13 VITALS — BP 142/80 | HR 84 | Resp 16 | Wt 182.0 lb

## 2016-06-13 DIAGNOSIS — Z90710 Acquired absence of both cervix and uterus: Secondary | ICD-10-CM

## 2016-06-13 DIAGNOSIS — N952 Postmenopausal atrophic vaginitis: Secondary | ICD-10-CM | POA: Diagnosis not present

## 2016-06-13 DIAGNOSIS — N950 Postmenopausal bleeding: Secondary | ICD-10-CM | POA: Diagnosis not present

## 2016-06-13 NOTE — Patient Instructions (Signed)
You can use 0.5 grams of the premarin cream 2 x a week at bedtime as needed.

## 2016-06-13 NOTE — Progress Notes (Signed)
82 y.o. [id number] WidowedCaucasianF here for a consult from Dr Martinique for evaluation of a vulvar lesion (possible bartholin's gland cyst)  The patient noticed spotting on her underwear on Sunday. She felt a small bump on her right vulva and felt the bleeding was coming from that side. No real pain, feels something, but not really pain. She might have had a BM prior to noticing the blood, she isn't sure.  She has a h/o a hysterectomy. Not sexually active. She has a script for premarin vaginal cream which she hasn't been using.     No LMP recorded. Patient is postmenopausal.          Sexually active: No.  The current method of family planning is post menopausal status.    Exercising: No.  The patient does not participate in regular exercise at present. Smoker:  Former smoker  Health Maintenance: Pap:  2016 WNL per patient  History of abnormal Pap:  no MMG:  10-24-14 WNL Colonoscopy: Years ago  BMD:   2009 Osteopenia  TDaP:  06-17-11  Gardasil: N/A   reports that she quit smoking about 37 years ago. She has never used smokeless tobacco. She reports that she drinks about 0.6 oz of alcohol per week . She reports that she does not use drugs.  Past Medical History:  Diagnosis Date  . Anxiety   . Arthritis   . GERD (gastroesophageal reflux disease)   . Hyperlipidemia   . Hypertension   . Osteopenia     Past Surgical History:  Procedure Laterality Date  . ABDOMINAL HYSTERECTOMY    . CHOLECYSTECTOMY    . JOINT REPLACEMENT    . TONSILLECTOMY      Current Outpatient Prescriptions  Medication Sig Dispense Refill  . acetaminophen (TYLENOL) 500 MG tablet Take 1 tablet (500 mg total) by mouth every 6 (six) hours as needed for pain. Take one  in AM and one at qhs, and one at noon if needed 30 tablet 0  . aspirin 81 MG EC tablet Take 81 mg by mouth daily.     . calcium-vitamin D (OSCAL WITH D 500-200) 500-200 MG-UNIT per tablet Take 1 tablet by mouth daily.     Marland Kitchen conjugated estrogens  (PREMARIN) vaginal cream Place 1 Applicatorful vaginally as needed. 42.5 g 3  . diphenhydramine-acetaminophen (TYLENOL PM) 25-500 MG TABS tablet Take 1 tablet by mouth at bedtime as needed.    . hydrochlorothiazide (HYDRODIURIL) 25 MG tablet Take 1 tablet (25 mg total) by mouth daily. 90 tablet 3  . loratadine (CLARITIN) 10 MG tablet Take 10 mg by mouth daily. Not sure of the mg/lc    . Multiple Vitamins-Minerals (PRESERVISION AREDS 2 PO) Take 2 tablets by mouth daily.     . mupirocin ointment (BACTROBAN) 2 % Apply 1 application topically 2 (two) times daily as needed. 30 g 5  . pantoprazole (PROTONIX) 40 MG tablet Take 1 tablet (40 mg total) by mouth daily. 90 tablet 3  . rosuvastatin (CRESTOR) 10 MG tablet Take 1 tablet (10 mg total) by mouth daily. 90 tablet 3  . sulfamethoxazole-trimethoprim (BACTRIM DS,SEPTRA DS) 800-160 MG tablet Take 1 tablet by mouth 2 (two) times daily. 14 tablet 0  . traZODone (DESYREL) 50 MG tablet TAKE 0.5-1 TABLETS (25-50 MG TOTAL) BY MOUTH AT BEDTIME AS NEEDED FOR SLEEP. 90 tablet 1  . Triamcinolone Acetonide (NASACORT ALLERGY 24HR NA) Place 1 spray into the nose daily.    Marland Kitchen triamcinolone cream (KENALOG) 0.1 % Apply 1 application  topically 2 (two) times daily. 80 g 0  . valACYclovir (VALTREX) 1000 MG tablet TAKE 1 TABLET BY MOUTH TWICE A DAY FOR 1 DAY. 30 tablet 5   No current facility-administered medications for this visit.     Family History  Problem Relation Age of Onset  . Alzheimer's disease Mother   . Heart disease Father   . Cancer Sister   . Lymphoma Sister     Review of Systems  Constitutional: Negative.   HENT: Negative.   Eyes: Negative.   Respiratory: Negative.   Cardiovascular: Negative.   Gastrointestinal: Negative.   Endocrine: Negative.   Genitourinary: Negative.        Vaginal cycst  Musculoskeletal: Negative.   Skin: Negative.   Allergic/Immunologic: Negative.   Neurological: Negative.   Psychiatric/Behavioral: Negative.      Exam:   BP (!) 142/80 (BP Location: Right Arm, Patient Position: Sitting, Cuff Size: Normal)   Pulse 84   Resp 16   Wt 182 lb (82.6 kg)   BMI 34.96 kg/m   Weight change: @WEIGHTCHANGE @ Height:      Ht Readings from Last 3 Encounters:  06/12/16 5' 0.5" (1.537 m)  05/21/16 5' 0.5" (1.537 m)  11/21/15 5' 0.5" (1.537 m)    General appearance: alert, cooperative and appears stated age   Pelvic: External genitalia:  no lesions, she has a small vulvar varicosity inside the right labia majora. No area's of whitening seen. Dry skin noted. Bartholin's gland is not swollen. No vulvar lesions seen.               Urethra:  normal appearing urethra with no masses, tenderness or lesions              Bartholins and Skenes: normal                 Vagina: atrophic appearing vagina, there are a few area's near her vaginal cuff that are erythematous and appear friable (looks atrophic, no lesions). No bleeding when touched gently with a cotton swab.   Cuff is well healed               Bimanual Exam:  Uterus:  uterus absent              Adnexa: no masses, slightly tender with palpation of the right adnexa (only with the abdominal hand)   Anus: no fissures or hemorrhoids seen                 Chaperone was present for exam.  A:  Postmenopausal spotting, s/p hysterectomy. The patient thought the bleeding was coming from her vulva. No source of bleeding from the vulva seen  The lesion seen by her primary is not seen today  Vaginal atrophy, the vagina appears as though it could bleed from the atrophy    P:   If the patient has continued episodes of spotting, I would recommend she try the premarin vaginal cream that she has (only 0.5 grams 2 x a week at bedtime). If she continues to bleed she should be re-evaluated  Advised her to pay close attention to any possible bleeding with BM  CC: Dr Betty Martinique Letter sent

## 2016-07-17 DIAGNOSIS — H353221 Exudative age-related macular degeneration, left eye, with active choroidal neovascularization: Secondary | ICD-10-CM | POA: Diagnosis not present

## 2016-08-17 ENCOUNTER — Other Ambulatory Visit: Payer: Self-pay | Admitting: Internal Medicine

## 2016-09-11 DIAGNOSIS — H353221 Exudative age-related macular degeneration, left eye, with active choroidal neovascularization: Secondary | ICD-10-CM | POA: Diagnosis not present

## 2016-11-06 DIAGNOSIS — H353221 Exudative age-related macular degeneration, left eye, with active choroidal neovascularization: Secondary | ICD-10-CM | POA: Diagnosis not present

## 2016-11-20 ENCOUNTER — Other Ambulatory Visit: Payer: Self-pay | Admitting: Internal Medicine

## 2016-11-20 ENCOUNTER — Encounter: Payer: Self-pay | Admitting: Internal Medicine

## 2016-11-20 ENCOUNTER — Ambulatory Visit (INDEPENDENT_AMBULATORY_CARE_PROVIDER_SITE_OTHER): Payer: Medicare Other | Admitting: Internal Medicine

## 2016-11-20 VITALS — BP 138/98 | HR 88 | Temp 97.8°F | Ht 60.0 in | Wt 182.6 lb

## 2016-11-20 DIAGNOSIS — E785 Hyperlipidemia, unspecified: Secondary | ICD-10-CM

## 2016-11-20 DIAGNOSIS — Z8679 Personal history of other diseases of the circulatory system: Secondary | ICD-10-CM

## 2016-11-20 DIAGNOSIS — I10 Essential (primary) hypertension: Secondary | ICD-10-CM | POA: Diagnosis not present

## 2016-11-20 LAB — CBC WITH DIFFERENTIAL/PLATELET
Basophils Absolute: 0 10*3/uL (ref 0.0–0.1)
Basophils Relative: 0.4 % (ref 0.0–3.0)
EOS PCT: 1.6 % (ref 0.0–5.0)
Eosinophils Absolute: 0.1 10*3/uL (ref 0.0–0.7)
HCT: 45.3 % (ref 36.0–46.0)
Hemoglobin: 15.1 g/dL — ABNORMAL HIGH (ref 12.0–15.0)
LYMPHS ABS: 0.9 10*3/uL (ref 0.7–4.0)
Lymphocytes Relative: 18.9 % (ref 12.0–46.0)
MCHC: 33.4 g/dL (ref 30.0–36.0)
MCV: 99.2 fl (ref 78.0–100.0)
MONO ABS: 0.4 10*3/uL (ref 0.1–1.0)
MONOS PCT: 8.8 % (ref 3.0–12.0)
NEUTROS ABS: 3.2 10*3/uL (ref 1.4–7.7)
NEUTROS PCT: 70.3 % (ref 43.0–77.0)
PLATELETS: 200 10*3/uL (ref 150.0–400.0)
RBC: 4.57 Mil/uL (ref 3.87–5.11)
RDW: 13.5 % (ref 11.5–15.5)
WBC: 4.6 10*3/uL (ref 4.0–10.5)

## 2016-11-20 LAB — COMPREHENSIVE METABOLIC PANEL
ALT: 20 U/L (ref 0–35)
AST: 21 U/L (ref 0–37)
Albumin: 4 g/dL (ref 3.5–5.2)
Alkaline Phosphatase: 96 U/L (ref 39–117)
BILIRUBIN TOTAL: 0.6 mg/dL (ref 0.2–1.2)
BUN: 19 mg/dL (ref 6–23)
CO2: 34 meq/L — AB (ref 19–32)
Calcium: 9.5 mg/dL (ref 8.4–10.5)
Chloride: 104 mEq/L (ref 96–112)
Creatinine, Ser: 1.06 mg/dL (ref 0.40–1.20)
GFR: 52 mL/min — AB (ref >60.00)
GLUCOSE: 91 mg/dL (ref 70–99)
POTASSIUM: 4.2 meq/L (ref 3.5–5.1)
SODIUM: 144 meq/L (ref 135–145)
TOTAL PROTEIN: 6.5 g/dL (ref 6.0–8.3)

## 2016-11-20 LAB — TSH: TSH: 3.67 u[IU]/mL (ref 0.35–4.50)

## 2016-11-20 LAB — LIPID PANEL
CHOL/HDL RATIO: 3
Cholesterol: 192 mg/dL (ref 0–200)
HDL: 67.6 mg/dL (ref >39.00)
LDL Cholesterol: 89 mg/dL (ref 0–99)
NONHDL: 124.55
Triglycerides: 179 mg/dL — ABNORMAL HIGH (ref 0.0–149.0)
VLDL: 35.8 mg/dL (ref 0.0–40.0)

## 2016-11-20 NOTE — Progress Notes (Signed)
Subjective:    Patient ID: Shelley Robinson, female    DOB: 1926-11-27, 83 y.o.   MRN: [id number]  HPI  83 year old patient who is seen for follow-up and for a Medicare wellness visit. She has had a gynecologic check.  Earlier due to some mild vaginal bleeding.  This was felt related to atrophic vaginitis She has a history of hypertension, mild dyslipidemia.  Her main problem is osteoarthritis involving back and knees primarily.  She does have a history of diastolic heart failure that has been quite stable. She has a history of macular degeneration and is followed closely by ophthalmology. She is also seen regular by orthopedics.  BP Readings from Last 3 Encounters:  11/20/16 (!) 138/98  06/13/16 (!) 142/80  06/12/16 (!) 144/90    Past Medical History:  Diagnosis Date  . Anxiety   . Arthritis   . GERD (gastroesophageal reflux disease)   . Hyperlipidemia   . Hypertension   . Osteopenia      Social History   Social History  . Marital status: Widowed    Spouse name: N/A  . Number of children: N/A  . Years of education: N/A   Occupational History  . Not on file.   Social History Main Topics  . Smoking status: Former Smoker    Quit date: 07/08/1978  . Smokeless tobacco: Never Used  . Alcohol use 0.6 oz/week    1 Glasses of wine per week     Comment: once or twice a month  . Drug use: No  . Sexual activity: No   Other Topics Concern  . Not on file   Social History Narrative  . No narrative on file    Past Surgical History:  Procedure Laterality Date  . ABDOMINAL HYSTERECTOMY    . CHOLECYSTECTOMY    . JOINT REPLACEMENT    . TONSILLECTOMY      Family History  Problem Relation Age of Onset  . Alzheimer's disease Mother   . Heart disease Father   . Cancer Sister   . Lymphoma Sister     Allergies  Allergen Reactions  . Lisinopril Cough  . Piroxicam Other (See Comments)    unknown  . Prednisone Other (See Comments)    hypertensive  . Penicillins  Rash    Current Outpatient Prescriptions on File Prior to Visit  Medication Sig Dispense Refill  . acetaminophen (TYLENOL) 500 MG tablet Take 1 tablet (500 mg total) by mouth every 6 (six) hours as needed for pain. Take one  in AM and one at qhs, and one at noon if needed 30 tablet 0  . aspirin 81 MG EC tablet Take 81 mg by mouth daily.     . calcium-vitamin D (OSCAL WITH D 500-200) 500-200 MG-UNIT per tablet Take 1 tablet by mouth daily.     Marland Kitchen conjugated estrogens (PREMARIN) vaginal cream Place 1 Applicatorful vaginally as needed. 42.5 g 3  . diphenhydramine-acetaminophen (TYLENOL PM) 25-500 MG TABS tablet Take 1 tablet by mouth at bedtime as needed.    . hydrochlorothiazide (HYDRODIURIL) 25 MG tablet TAKE 1 TABLET (25 MG TOTAL) BY MOUTH DAILY. 90 tablet 2  . loratadine (CLARITIN) 10 MG tablet Take 10 mg by mouth daily. Not sure of the mg/lc    . Multiple Vitamins-Minerals (PRESERVISION AREDS 2 PO) Take 2 tablets by mouth daily.     . mupirocin ointment (BACTROBAN) 2 % Apply 1 application topically 2 (two) times daily as needed. 30 g 5  .  pantoprazole (PROTONIX) 40 MG tablet Take 1 tablet (40 mg total) by mouth daily. 90 tablet 3  . rosuvastatin (CRESTOR) 10 MG tablet Take 1 tablet (10 mg total) by mouth daily. 90 tablet 3  . traZODone (DESYREL) 50 MG tablet TAKE 0.5-1 TABLETS (25-50 MG TOTAL) BY MOUTH AT BEDTIME AS NEEDED FOR SLEEP. 90 tablet 1  . Triamcinolone Acetonide (NASACORT ALLERGY 24HR NA) Place 1 spray into the nose daily.    Marland Kitchen triamcinolone cream (KENALOG) 0.1 % Apply 1 application topically 2 (two) times daily. 80 g 0  . valACYclovir (VALTREX) 1000 MG tablet TAKE 1 TABLET BY MOUTH TWICE A DAY FOR 1 DAY. 30 tablet 5   No current facility-administered medications on file prior to visit.     BP (!) 138/98 (BP Location: Right Arm, Patient Position: Sitting, Cuff Size: Normal)   Pulse 88   Temp 97.8 F (36.6 C) (Oral)   Ht 5' (1.524 m)   Wt 182 lb 9.6 oz (82.8 kg)   SpO2 96%    BMI 35.66 kg/m   Medicare wellness visit  1. Risk factors, based on past  M,S,F history.  Current vascular risk factors include hypertension and dyslipidemia.  She has a history of diastolic heart failure  2.  Physical activities: very sedentary due to arthritis and obesity.  Has considerable knee pain  3.  Depression/mood:no history of major depression or mood disorder  4.  Hearing:mild deficits only  5.  ADL's:independent  6.  Fall risk:high fall risk due to knee arthritis, obesityand age  83.  Home safety:no problems identified  8.  Height weight, and visual acuity;height and weight stable is followed closely ophthalmology for macular degeneration receives injections every 6 weeks  9.  Counseling:continue efforts at weight loss and more activities  10. Lab orders based on risk factors:laboratory update will be reviewed  11. Referral :follow-up ophthalmology, orthopedics  12. Care plan:continue efforts at risk factor modification  13. Cognitive assessment: alert and normal with normal affect.  No cognitive dysfunction  14. Screening: Patient provided with a written and personalized 5-10 year screening schedule in the AVS.    15. Provider List Update: GYN and ophthalmology, orthopedics and primary care as well as podiatry    Review of Systems  Constitutional: Negative.   HENT: Negative for congestion, dental problem, hearing loss, rhinorrhea, sinus pressure, sore throat and tinnitus.   Eyes: Negative for pain, discharge and visual disturbance.  Respiratory: Negative for cough and shortness of breath.   Cardiovascular: Negative for chest pain, palpitations and leg swelling.  Gastrointestinal: Negative for abdominal distention, abdominal pain, blood in stool, constipation, diarrhea, nausea and vomiting.  Genitourinary: Negative for difficulty urinating, dysuria, flank pain, frequency, hematuria, pelvic pain, urgency, vaginal bleeding, vaginal discharge and vaginal pain.    Musculoskeletal: Positive for arthralgias, back pain and joint swelling. Negative for gait problem.  Skin: Negative for rash.  Neurological: Negative for dizziness, syncope, speech difficulty, weakness, numbness and headaches.  Hematological: Negative for adenopathy.  Psychiatric/Behavioral: Negative for agitation, behavioral problems and dysphoric mood. The patient is not nervous/anxious.        Objective:   Physical Exam  Constitutional: She is oriented to person, place, and time. She appears well-developed and well-nourished.  Blood pressure 140/88  HENT:  Head: Normocephalic.  Right Ear: External ear normal.  Left Ear: External ear normal.  Mouth/Throat: Oropharynx is clear and moist.  edentulous  Eyes: Conjunctivae and EOM are normal. Pupils are equal, round, and reactive to light.  Neck: Normal range of motion. Neck supple. No thyromegaly present.  Cardiovascular: Normal rate, regular rhythm and normal heart sounds.   Left dorsalis pedis pulses full.  Other peripheral pulses not easily palpable.  No ischemic changes  Pulmonary/Chest: Effort normal and breath sounds normal.  Abdominal: Soft. Bowel sounds are normal. She exhibits no mass. There is no tenderness.  Lower midline scar  Musculoskeletal: Normal range of motion.  Lymphadenopathy:    She has no cervical adenopathy.  Neurological: She is alert and oriented to person, place, and time.  Skin: Skin is warm and dry. No rash noted.  Surgical scar left knee  Psychiatric: She has a normal mood and affect. Her behavior is normal.          Assessment & Plan:   Medicare wellness visit Essential hypertension.  Blood pressure bit elevated today but no medications.  We'll continue to observe on present regimen Rule out chronic kidney disease.  Last creatinine was elevated 1.24 with a GFR of 43.  Will review Macular degeneration.  Follow-up ophthalmology Osteoarthritis.  Follow-up orthopedic  Return here 6  months Review lab Medications updated  Nyoka Cowden

## 2016-11-20 NOTE — Patient Instructions (Addendum)
WE NOW OFFER   [facility] FAST TRACK!!!  SAME DAY Appointments for ACUTE CARE  Such as: Sprains, Injuries, cuts, abrasions, rashes, muscle pain, joint pain, back pain Colds, flu, sore throats, headache, allergies, cough, fever  Ear pain, sinus and eye infections Abdominal pain, nausea, vomiting, diarrhea, upset stomach Animal/insect bites  3 Easy Ways to Schedule: Walk-In Scheduling Call in scheduling Mychart Sign-up: [email or web address]    Limit your sodium (Salt) intake  You need to lose weight.  Consider a lower calorie diet and regular exercise.  Please check your blood pressure on a regular basis.  If it is consistently greater than 150/90, please make an office appointment.  Return in 6 months for follow-up

## 2016-12-11 DIAGNOSIS — M25561 Pain in right knee: Secondary | ICD-10-CM | POA: Diagnosis not present

## 2016-12-11 DIAGNOSIS — M25562 Pain in left knee: Secondary | ICD-10-CM | POA: Diagnosis not present

## 2016-12-11 DIAGNOSIS — M170 Bilateral primary osteoarthritis of knee: Secondary | ICD-10-CM | POA: Diagnosis not present

## 2017-01-01 DIAGNOSIS — H353221 Exudative age-related macular degeneration, left eye, with active choroidal neovascularization: Secondary | ICD-10-CM | POA: Diagnosis not present

## 2017-01-02 ENCOUNTER — Telehealth: Payer: Self-pay | Admitting: Internal Medicine

## 2017-01-02 NOTE — Telephone Encounter (Signed)
Patient dropped off form for [employer] Dis[employer] form to be filled out.    -Mail in self-addressed stamped envelope from patient to:      [employer].      [place], [place] [id number]  -Form placed in Dr's folder

## 2017-01-06 NOTE — Telephone Encounter (Signed)
Form filled out and Dr. Raliegh Ip made a letter.  Sent by mail per the pt request.

## 2017-01-31 DIAGNOSIS — M25561 Pain in right knee: Secondary | ICD-10-CM | POA: Diagnosis not present

## 2017-01-31 DIAGNOSIS — M25562 Pain in left knee: Secondary | ICD-10-CM | POA: Diagnosis not present

## 2017-01-31 DIAGNOSIS — M170 Bilateral primary osteoarthritis of knee: Secondary | ICD-10-CM | POA: Diagnosis not present

## 2017-02-22 ENCOUNTER — Other Ambulatory Visit: Payer: Self-pay | Admitting: Internal Medicine

## 2017-02-26 DIAGNOSIS — D3131 Benign neoplasm of right choroid: Secondary | ICD-10-CM | POA: Diagnosis not present

## 2017-02-26 DIAGNOSIS — D3132 Benign neoplasm of left choroid: Secondary | ICD-10-CM | POA: Diagnosis not present

## 2017-02-26 DIAGNOSIS — H353221 Exudative age-related macular degeneration, left eye, with active choroidal neovascularization: Secondary | ICD-10-CM | POA: Diagnosis not present

## 2017-04-22 DIAGNOSIS — Z23 Encounter for immunization: Secondary | ICD-10-CM | POA: Diagnosis not present

## 2017-04-23 DIAGNOSIS — H353221 Exudative age-related macular degeneration, left eye, with active choroidal neovascularization: Secondary | ICD-10-CM | POA: Diagnosis not present

## 2017-05-10 ENCOUNTER — Other Ambulatory Visit: Payer: Self-pay | Admitting: Internal Medicine

## 2017-05-27 ENCOUNTER — Ambulatory Visit: Payer: Medicare Other | Admitting: Internal Medicine

## 2017-06-04 ENCOUNTER — Other Ambulatory Visit: Payer: Self-pay | Admitting: Internal Medicine

## 2017-06-11 DIAGNOSIS — M25511 Pain in right shoulder: Secondary | ICD-10-CM | POA: Diagnosis not present

## 2017-06-11 DIAGNOSIS — M7551 Bursitis of right shoulder: Secondary | ICD-10-CM | POA: Diagnosis not present

## 2017-06-16 ENCOUNTER — Ambulatory Visit: Payer: Medicare Other | Admitting: Internal Medicine

## 2017-06-25 DIAGNOSIS — H353221 Exudative age-related macular degeneration, left eye, with active choroidal neovascularization: Secondary | ICD-10-CM | POA: Diagnosis not present

## 2017-07-15 ENCOUNTER — Encounter: Payer: Self-pay | Admitting: Internal Medicine

## 2017-07-15 ENCOUNTER — Ambulatory Visit (INDEPENDENT_AMBULATORY_CARE_PROVIDER_SITE_OTHER): Payer: Medicare Other | Admitting: Internal Medicine

## 2017-07-15 VITALS — BP 158/88 | HR 97 | Temp 97.8°F | Ht 60.0 in | Wt 180.4 lb

## 2017-07-15 DIAGNOSIS — I10 Essential (primary) hypertension: Secondary | ICD-10-CM

## 2017-07-15 DIAGNOSIS — M545 Low back pain, unspecified: Secondary | ICD-10-CM

## 2017-07-15 DIAGNOSIS — G8929 Other chronic pain: Secondary | ICD-10-CM

## 2017-07-15 DIAGNOSIS — E785 Hyperlipidemia, unspecified: Secondary | ICD-10-CM | POA: Diagnosis not present

## 2017-07-15 DIAGNOSIS — M159 Polyosteoarthritis, unspecified: Secondary | ICD-10-CM

## 2017-07-15 DIAGNOSIS — M150 Primary generalized (osteo)arthritis: Secondary | ICD-10-CM | POA: Diagnosis not present

## 2017-07-15 MED ORDER — LOSARTAN POTASSIUM-HCTZ 50-12.5 MG PO TABS: 1.0000 | ORAL_TABLET | Freq: Every day | ORAL | 3 refills | Status: DC

## 2017-07-15 NOTE — Patient Instructions (Signed)
Losartan/hydrochlorothiazide 1 tablet daily  Return in 4 weeks for follow-up

## 2017-07-15 NOTE — Progress Notes (Signed)
Subjective:    Patient ID: Shelley Robinson, female    DOB: Mar 07, 1927, 84 y.o.   MRN: [id number]  HPI  84 year old patient seen today for follow-up.  She has essential hypertension. Blood pressure has been slightly elevated.  At each visit she hold her blood pressure medication prior to the exam but she appears to be consistently elevated on diuretic therapy alone. She has significant osteoarthritis and low back pain.  She is requesting Renewal of a disability placard  No cardiopulmonary complaints  Since her last visit here she has had a flu vaccine in October and also shingles vaccine in June and November of last year  Past Medical History:  Diagnosis Date  . Anxiety   . Arthritis   . GERD (gastroesophageal reflux disease)   . Hyperlipidemia   . Hypertension   . Osteopenia      Social History   Socioeconomic History  . Marital status: Widowed    Spouse name: Not on file  . Number of children: Not on file  . Years of education: Not on file  . Highest education level: Not on file  Social Needs  . Financial resource strain: Not on file  . Food insecurity - worry: Not on file  . Food insecurity - inability: Not on file  . Transportation needs - medical: Not on file  . Transportation needs - non-medical: Not on file  Occupational History  . Not on file  Tobacco Use  . Smoking status: Former Smoker    Last attempt to quit: 07/08/1978    Years since quitting: 39.0  . Smokeless tobacco: Never Used  Substance and Sexual Activity  . Alcohol use: Yes    Alcohol/week: 0.6 oz    Types: 1 Glasses of wine per week    Comment: once or twice a month  . Drug use: No  . Sexual activity: No    Partners: Male    Birth control/protection: Post-menopausal  Other Topics Concern  . Not on file  Social History Narrative  . Not on file    Past Surgical History:  Procedure Laterality Date  . ABDOMINAL HYSTERECTOMY    . CHOLECYSTECTOMY    . JOINT REPLACEMENT    .  TONSILLECTOMY      Family History  Problem Relation Age of Onset  . Alzheimer's disease Mother   . Heart disease Father   . Cancer Sister   . Lymphoma Sister     Allergies  Allergen Reactions  . Lisinopril Cough  . Piroxicam Other (See Comments)    unknown  . Prednisone Other (See Comments)    hypertensive  . Penicillins Rash    Current Outpatient Medications on File Prior to Visit  Medication Sig Dispense Refill  . acetaminophen (TYLENOL) 500 MG tablet Take 1 tablet (500 mg total) by mouth every 6 (six) hours as needed for pain. Take one  in AM and one at qhs, and one at noon if needed 30 tablet 0  . aspirin 81 MG EC tablet Take 81 mg by mouth daily.     . calcium-vitamin D (OSCAL WITH D 500-200) 500-200 MG-UNIT per tablet Take 1 tablet by mouth daily.     Marland Kitchen conjugated estrogens (PREMARIN) vaginal cream Place 1 Applicatorful vaginally as needed. 42.5 g 3  . diphenhydramine-acetaminophen (TYLENOL PM) 25-500 MG TABS tablet Take 1 tablet by mouth at bedtime as needed.    . hydrochlorothiazide (HYDRODIURIL) 25 MG tablet TAKE 1 TABLET (25 MG TOTAL) BY MOUTH DAILY.  90 tablet 0  . loratadine (CLARITIN) 10 MG tablet Take 10 mg by mouth daily. Not sure of the mg/lc    . Multiple Vitamins-Minerals (PRESERVISION AREDS 2 PO) Take 2 tablets by mouth daily.     . mupirocin ointment (BACTROBAN) 2 % Apply 1 application topically 2 (two) times daily as needed. 30 g 5  . pantoprazole (PROTONIX) 40 MG tablet TAKE ONE TABLET BY MOUTH DAILY 90 tablet 2  . rosuvastatin (CRESTOR) 10 MG tablet TAKE 1 TABLET (10 MG TOTAL) BY MOUTH DAILY. 90 tablet 2  . Triamcinolone Acetonide (NASACORT ALLERGY 24HR NA) Place 1 spray into the nose daily.    Marland Kitchen triamcinolone cream (KENALOG) 0.1 % Apply 1 application topically 2 (two) times daily. 80 g 0  . valACYclovir (VALTREX) 1000 MG tablet TAKE 1 TABLET BY MOUTH TWICE A DAY FOR 1 DAY. 30 tablet 4   No current facility-administered medications on file prior to visit.       BP (!) 158/88 (BP Location: Left Arm, Patient Position: Sitting, Cuff Size: Normal)   Pulse 97   Temp 97.8 F (36.6 C) (Oral)   Ht 5' (1.524 m)   Wt 180 lb 6.4 oz (81.8 kg)   SpO2 96%   BMI 35.23 kg/m     Review of Systems  Constitutional: Negative.   HENT: Negative for congestion, dental problem, hearing loss, rhinorrhea, sinus pressure, sore throat and tinnitus.   Eyes: Negative for pain, discharge and visual disturbance.  Respiratory: Negative for cough and shortness of breath.   Cardiovascular: Negative for chest pain, palpitations and leg swelling.  Gastrointestinal: Negative for abdominal distention, abdominal pain, blood in stool, constipation, diarrhea, nausea and vomiting.  Genitourinary: Negative for difficulty urinating, dysuria, flank pain, frequency, hematuria, pelvic pain, urgency, vaginal bleeding, vaginal discharge and vaginal pain.  Musculoskeletal: Positive for arthralgias, back pain and gait problem. Negative for joint swelling.  Skin: Negative for rash.  Neurological: Negative for dizziness, syncope, speech difficulty, weakness, numbness and headaches.  Hematological: Negative for adenopathy.  Psychiatric/Behavioral: Negative for agitation, behavioral problems and dysphoric mood. The patient is not nervous/anxious.        Objective:   Physical Exam  Constitutional: She is oriented to person, place, and time. She appears well-developed and well-nourished.  Overweight Blood pressure 160/90  HENT:  Head: Normocephalic.  Right Ear: External ear normal.  Left Ear: External ear normal.  Mouth/Throat: Oropharynx is clear and moist.  Eyes: Conjunctivae and EOM are normal. Pupils are equal, round, and reactive to light.  Neck: Normal range of motion. Neck supple. No thyromegaly present.  Cardiovascular: Normal rate, regular rhythm, normal heart sounds and intact distal pulses.  Pulmonary/Chest: Effort normal and breath sounds normal.  Abdominal: Soft. Bowel  sounds are normal. She exhibits no mass. There is no tenderness.  Musculoskeletal: Normal range of motion.  Lymphadenopathy:    She has no cervical adenopathy.  Neurological: She is alert and oriented to person, place, and time.  Skin: Skin is warm and dry. No rash noted.  Psychiatric: She has a normal mood and affect. Her behavior is normal.          Assessment & Plan:   Hypertension suboptimal control.  Will add lisinopril 10 mg.  Follow-up 4 weeks Low-salt diet recommended Osteoarthritis Chronic low back pain.  Application for disability parking completed Dyslipidemia continue statin therapy   Shelley Robinson

## 2017-08-19 ENCOUNTER — Encounter: Payer: Self-pay | Admitting: Internal Medicine

## 2017-08-19 ENCOUNTER — Ambulatory Visit (INDEPENDENT_AMBULATORY_CARE_PROVIDER_SITE_OTHER): Payer: Medicare Other | Admitting: Internal Medicine

## 2017-08-19 VITALS — BP 160/90 | HR 57 | Temp 97.7°F | Ht 60.0 in | Wt 178.0 lb

## 2017-08-19 DIAGNOSIS — I10 Essential (primary) hypertension: Secondary | ICD-10-CM | POA: Diagnosis not present

## 2017-08-19 DIAGNOSIS — Z8679 Personal history of other diseases of the circulatory system: Secondary | ICD-10-CM | POA: Diagnosis not present

## 2017-08-19 MED ORDER — LOSARTAN POTASSIUM-HCTZ 100-25 MG PO TABS: 1.0000 | ORAL_TABLET | Freq: Every day | ORAL | 3 refills | Status: DC

## 2017-08-19 NOTE — Patient Instructions (Signed)
Increase losartan hydrochlorothiazide to 100/25.    Okay to take 2 of your present blood pressure medication of losartan hydrochlorothiazide 50/12.5  Your new prescription will be losartan 100/25 to take 1 tablet daily  Return in 1 month for follow-up

## 2017-08-19 NOTE — Progress Notes (Signed)
Subjective:    Patient ID: Shelley Robinson, female    DOB: 1926-11-05, 84 y.o.   MRN: [id number]  HPI  84 year old patient who is seen today for follow-up of hypertension.  She has a prior sensitivity to lisinopril with cough and presently is on losartan hydrochlorothiazide.  She has been compliant with her medications.  No issues with cough or other concerns  Past Medical History:  Diagnosis Date  . Anxiety   . Arthritis   . GERD (gastroesophageal reflux disease)   . Hyperlipidemia   . Hypertension   . Osteopenia      Social History   Socioeconomic History  . Marital status: Widowed    Spouse name: Not on file  . Number of children: Not on file  . Years of education: Not on file  . Highest education level: Not on file  Social Needs  . Financial resource strain: Not on file  . Food insecurity - worry: Not on file  . Food insecurity - inability: Not on file  . Transportation needs - medical: Not on file  . Transportation needs - non-medical: Not on file  Occupational History  . Not on file  Tobacco Use  . Smoking status: Former Smoker    Last attempt to quit: 07/08/1978    Years since quitting: 39.1  . Smokeless tobacco: Never Used  Substance and Sexual Activity  . Alcohol use: Yes    Alcohol/week: 0.6 oz    Types: 1 Glasses of wine per week    Comment: once or twice a month  . Drug use: No  . Sexual activity: No    Partners: Male    Birth control/protection: Post-menopausal  Other Topics Concern  . Not on file  Social History Narrative  . Not on file    Past Surgical History:  Procedure Laterality Date  . ABDOMINAL HYSTERECTOMY    . CHOLECYSTECTOMY    . JOINT REPLACEMENT    . TONSILLECTOMY      Family History  Problem Relation Age of Onset  . Alzheimer's disease Mother   . Heart disease Father   . Cancer Sister   . Lymphoma Sister     Allergies  Allergen Reactions  . Lisinopril Cough  . Piroxicam Other (See Comments)    unknown  .  Prednisone Other (See Comments)    hypertensive  . Penicillins Rash    Current Outpatient Medications on File Prior to Visit  Medication Sig Dispense Refill  . acetaminophen (TYLENOL) 500 MG tablet Take 1 tablet (500 mg total) by mouth every 6 (six) hours as needed for pain. Take one  in AM and one at qhs, and one at noon if needed 30 tablet 0  . aspirin 81 MG EC tablet Take 81 mg by mouth daily.     Marland Kitchen conjugated estrogens (PREMARIN) vaginal cream Place 1 Applicatorful vaginally as needed. 42.5 g 3  . diphenhydramine-acetaminophen (TYLENOL PM) 25-500 MG TABS tablet Take 1 tablet by mouth at bedtime as needed.    . hydrochlorothiazide (HYDRODIURIL) 25 MG tablet Take by mouth.    . loratadine (CLARITIN) 10 MG tablet Take 10 mg by mouth daily. Not sure of the mg/lc    . losartan-hydrochlorothiazide (HYZAAR) 50-12.5 MG tablet Take 1 tablet by mouth daily. 90 tablet 3  . Multiple Vitamins-Minerals (PRESERVISION AREDS 2 PO) Take 2 tablets by mouth daily.     . mupirocin ointment (BACTROBAN) 2 % Apply 1 application topically 2 (two) times daily as needed. [place]  g 5  . NEOMYCIN-POLYMYXIN-HYDROCORTISONE (CORTISPORIN) 1 % SOLN OTIC solution 3.5-10,000-1 mg-unit/mL-%    . omeprazole (PRILOSEC) 20 MG capsule Take by mouth.    . pantoprazole (PROTONIX) 40 MG tablet TAKE ONE TABLET BY MOUTH DAILY 90 tablet 2  . rosuvastatin (CRESTOR) 10 MG tablet TAKE 1 TABLET (10 MG TOTAL) BY MOUTH DAILY. 90 tablet 2  . triamcinolone cream (KENALOG) 0.1 % Apply 1 application topically 2 (two) times daily. 80 g 0  . valACYclovir (VALTREX) 1000 MG tablet TAKE 1 TABLET BY MOUTH TWICE A DAY FOR 1 DAY. 30 tablet 4  . meloxicam (MOBIC) 15 MG tablet     . SHINGRIX injection LOT                     RA/LA                   EXP                    MANU  1  . traZODone (DESYREL) 50 MG tablet trazodone 50 mg tablet  Take 1 tablet as needed by oral route for 30 days.    . Triamcinolone Acetonide (NASACORT ALLERGY 24HR NA) Place 1 spray  into the nose daily.     No current facility-administered medications on file prior to visit.     BP (!) 160/90 (BP Location: Right Arm, Patient Position: Sitting)   Pulse (!) 57   Temp 97.7 F (36.5 C) (Oral)   Ht 5' (1.524 m)   Wt 178 lb (80.7 kg)   SpO2 97%   BMI 34.76 kg/m     Review of Systems  Constitutional: Negative.   HENT: Negative for congestion, dental problem, hearing loss, rhinorrhea, sinus pressure, sore throat and tinnitus.   Eyes: Negative for pain, discharge and visual disturbance.  Respiratory: Negative for cough and shortness of breath.   Cardiovascular: Negative for chest pain, palpitations and leg swelling.  Gastrointestinal: Negative for abdominal distention, abdominal pain, blood in stool, constipation, diarrhea, nausea and vomiting.  Genitourinary: Negative for difficulty urinating, dysuria, flank pain, frequency, hematuria, pelvic pain, urgency, vaginal bleeding, vaginal discharge and vaginal pain.  Musculoskeletal: Negative for arthralgias, gait problem and joint swelling.  Skin: Negative for rash.  Neurological: Negative for dizziness, syncope, speech difficulty, weakness, numbness and headaches.  Hematological: Negative for adenopathy.  Psychiatric/Behavioral: Negative for agitation, behavioral problems and dysphoric mood. The patient is not nervous/anxious.        Objective:   Physical Exam  Constitutional: She appears well-developed and well-nourished. No distress.  Blood pressure 160/94  Pulmonary/Chest: Effort normal. No respiratory distress. She has no wheezes. She has no rales.          Assessment & Plan:   Hypertension.  Suboptimal control.  Will increase losartan hydrochlorothiazide to losartan 100/hydrochlorothiazide 25  Follow-up 1 month  Nyoka Cowden

## 2017-08-20 DIAGNOSIS — H353221 Exudative age-related macular degeneration, left eye, with active choroidal neovascularization: Secondary | ICD-10-CM | POA: Diagnosis not present

## 2017-09-02 ENCOUNTER — Encounter: Payer: Self-pay | Admitting: Family Medicine

## 2017-09-02 ENCOUNTER — Ambulatory Visit: Payer: Self-pay | Admitting: *Deleted

## 2017-09-02 ENCOUNTER — Ambulatory Visit (INDEPENDENT_AMBULATORY_CARE_PROVIDER_SITE_OTHER): Payer: Medicare Other | Admitting: Family Medicine

## 2017-09-02 VITALS — BP 140/90 | HR 98 | Temp 97.9°F | Wt 180.0 lb

## 2017-09-02 DIAGNOSIS — S90511A Abrasion, right ankle, initial encounter: Secondary | ICD-10-CM

## 2017-09-02 DIAGNOSIS — R040 Epistaxis: Secondary | ICD-10-CM

## 2017-09-02 NOTE — Progress Notes (Signed)
Shelley Robinson is an 84 year old female patient of Dr. Raliegh Ip who comes in today for multiple issues  She states last night her nose is irritated and she had a nosebleed. It stopped spontaneously. No history of trauma.  She also has an abrasion on her right lower ankle that she sustained 2 weeks ago. She's kept it covered with some OTC antibiotic ointment.  She also has multiple other questions and concerns  BP 140/90 (BP Location: Left Arm, Patient Position: Sitting, Cuff Size: Normal)   Pulse 98   Temp 97.9 F (36.6 C) (Oral)   Wt 180 lb (81.6 kg)   BMI 35.15 kg/m  Well-developed well-nourished female no acute distress examination nose is normal no evidence of any active bleeding.  Examination right lower extremity shows a healing abrasion extremely extremely superficial dime size  #1 status post nosebleed.......... hold aspirin for 1 week  #2 abrasion right lower extremity....... okay leave open to the air it's healing nicely  #3...Marland KitchenMarland KitchenMarland Kitchen multiple other issues......... discussed with Dr. Raliegh Ip

## 2017-09-02 NOTE — Patient Instructions (Signed)
Hold the aspirin for 1 week since she had a nosebleed last night  Okay to leave the abrasion open to the air it's healing nicely

## 2017-09-02 NOTE — Telephone Encounter (Signed)
Pt called state that she woke up last night with acid reflux; she also states that she ate a piece of chocolate before she went to bed; pt states that she started coughing and went to the bathroom and coughed up bright red blood; pt unable to quantify amount "maybe 1/2 cup; pt also states that she has been having a nose bleed during that time; pt states that the nose bleed stopped after 5 minutes; the pt states that she also hit her leg day before yesterday and had a dfificult time making that stop bleeding also; recommendations made per nurse triage to include seeing a physician within 24 hours; pt would like to see Dr Burnice Logan at [facility] but no appointments available within guidelines set forth by protocol; pt offered and accepted appointment with Dr Stevie Kern today at 1400; pt verbalizes understanding.    Reason for Disposition . Taking Coumadin (warfarin) or other strong blood thinner, or known bleeding disorder (e.g., thrombocytopenia)  Answer Assessment - Initial Assessment Questions 1. AMOUNT OF BLEEDING: "How bad is the bleeding?" "How much blood was lost?" "Has the bleeding stopped?"   - MILD: needed a couple tissues   - MODERATE: needed many tissues   - SEVERE: large blood clots, soaked many tissues, lasted more than 30 minutes      moderate 2. ONSET: "When did the nosebleed start?"      09/01/17 1150 3. FREQUENCY: "How many nosebleeds have you had in the last 24 hours?"      1 4. RECURRENT SYMPTOMS: "Have there been other recent nosebleeds?" If so, ask: "How long did it take you to stop the bleeding?" "What worked best?"      no 5. CAUSE: "What do you think caused this nosebleed?"     Sore in nose 6. LOCAL FACTORS: "Do you have any cold symptoms?", "Have you been rubbing or picking at your nose?"     "Runny nose like sinus drips" 7. SYSTEMIC FACTORS: "Do you have high blood pressure or any bleeding problems?"     High blood pressure 8. BLOOD THINNERS: "Do you take any  blood thinners?" (e.g., coumadin, heparin, aspirin, Plavix)     Baby aspirin daily 9. OTHER SYMPTOMS: "Do you have any other symptoms?" (e.g., lightheadedness)     no 10. PREGNANCY: "Is there any chance you are pregnant?" "When was your last menstrual period?"       no  Protocols used: NOSEBLEED-A-AH

## 2017-09-16 ENCOUNTER — Encounter: Payer: Self-pay | Admitting: Internal Medicine

## 2017-09-16 ENCOUNTER — Ambulatory Visit (INDEPENDENT_AMBULATORY_CARE_PROVIDER_SITE_OTHER): Payer: Medicare Other | Admitting: Internal Medicine

## 2017-09-16 VITALS — BP 130/82 | HR 82 | Wt 180.2 lb

## 2017-09-16 DIAGNOSIS — I10 Essential (primary) hypertension: Secondary | ICD-10-CM | POA: Diagnosis not present

## 2017-09-16 NOTE — Progress Notes (Signed)
Subjective:    Patient ID: Shelley Robinson, female    DOB: 03-08-1927, 84 y.o.   MRN: [id number]  HPI  84 year old patient who is seen today for follow-up.  1 month ago blood pressure was noted to be elevated and Losartan hydrochlorothiazide dosing was increased She feels well on this regimen. Since her last visit with me she was also seen for a episode of minor epistaxis which has not reoccurred she is back on low-dose aspirin She also has a traumatic superficial wound involving her anterior right lower leg this has been slow to heal  Past Medical History:  Diagnosis Date  . Anxiety   . Arthritis   . GERD (gastroesophageal reflux disease)   . Hyperlipidemia   . Hypertension   . Osteopenia      Social History   Socioeconomic History  . Marital status: Widowed    Spouse name: Not on file  . Number of children: Not on file  . Years of education: Not on file  . Highest education level: Not on file  Social Needs  . Financial resource strain: Not on file  . Food insecurity - worry: Not on file  . Food insecurity - inability: Not on file  . Transportation needs - medical: Not on file  . Transportation needs - non-medical: Not on file  Occupational History  . Not on file  Tobacco Use  . Smoking status: Former Smoker    Last attempt to quit: 07/08/1978    Years since quitting: 39.2  . Smokeless tobacco: Never Used  Substance and Sexual Activity  . Alcohol use: Yes    Alcohol/week: 0.6 oz    Types: 1 Glasses of wine per week    Comment: once or twice a month  . Drug use: No  . Sexual activity: No    Partners: Male    Birth control/protection: Post-menopausal  Other Topics Concern  . Not on file  Social History Narrative  . Not on file    Past Surgical History:  Procedure Laterality Date  . ABDOMINAL HYSTERECTOMY    . CHOLECYSTECTOMY    . JOINT REPLACEMENT    . TONSILLECTOMY      Family History  Problem Relation Age of Onset  . Alzheimer's disease Mother     . Heart disease Father   . Cancer Sister   . Lymphoma Sister     Allergies  Allergen Reactions  . Lisinopril Cough  . Piroxicam Other (See Comments)    unknown  . Prednisone Other (See Comments)    hypertensive  . Penicillins Rash    Current Outpatient Medications on File Prior to Visit  Medication Sig Dispense Refill  . acetaminophen (TYLENOL) 500 MG tablet Take 1 tablet (500 mg total) by mouth every 6 (six) hours as needed for pain. Take one  in AM and one at qhs, and one at noon if needed 30 tablet 0  . aspirin 81 MG EC tablet Take 81 mg by mouth daily.     Marland Kitchen conjugated estrogens (PREMARIN) vaginal cream Place 1 Applicatorful vaginally as needed. 42.5 g 3  . diphenhydramine-acetaminophen (TYLENOL PM) 25-500 MG TABS tablet Take 1 tablet by mouth at bedtime as needed.    . loratadine (CLARITIN) 10 MG tablet Take 10 mg by mouth daily. Not sure of the mg/lc    . losartan-hydrochlorothiazide (HYZAAR) 100-25 MG tablet Take 1 tablet by mouth daily. 90 tablet 3  . meloxicam (MOBIC) 15 MG tablet     .  Multiple Vitamins-Minerals (PRESERVISION AREDS 2 PO) Take 2 tablets by mouth daily.     . mupirocin ointment (BACTROBAN) 2 % Apply 1 application topically 2 (two) times daily as needed. 30 g 5  . NEOMYCIN-POLYMYXIN-HYDROCORTISONE (CORTISPORIN) 1 % SOLN OTIC solution 3.5-10,000-1 mg-unit/mL-%    . omeprazole (PRILOSEC) 20 MG capsule Take by mouth.    . pantoprazole (PROTONIX) 40 MG tablet TAKE ONE TABLET BY MOUTH DAILY 90 tablet 2  . rosuvastatin (CRESTOR) 10 MG tablet TAKE 1 TABLET (10 MG TOTAL) BY MOUTH DAILY. 90 tablet 2  . SHINGRIX injection LOT                     RA/LA                   EXP                    MANU  1  . traZODone (DESYREL) 50 MG tablet trazodone 50 mg tablet  Take 1 tablet as needed by oral route for 30 days.    . Triamcinolone Acetonide (NASACORT ALLERGY 24HR NA) Place 1 spray into the nose daily.    Marland Kitchen triamcinolone cream (KENALOG) 0.1 % Apply 1 application topically  2 (two) times daily. 80 g 0  . valACYclovir (VALTREX) 1000 MG tablet TAKE 1 TABLET BY MOUTH TWICE A DAY FOR 1 DAY. 30 tablet 4   No current facility-administered medications on file prior to visit.     BP 130/82 (BP Location: Right Arm, Patient Position: Sitting, Cuff Size: Large)   Pulse 82   Wt 180 lb 3.2 oz (81.7 kg)   SpO2 99%   BMI 35.19 kg/m     Review of Systems  Constitutional: Negative.   HENT: Positive for nosebleeds. Negative for congestion, dental problem, hearing loss, rhinorrhea, sinus pressure, sore throat and tinnitus.   Eyes: Negative for pain, discharge and visual disturbance.  Respiratory: Negative for cough and shortness of breath.   Cardiovascular: Negative for chest pain, palpitations and leg swelling.  Gastrointestinal: Negative for abdominal distention, abdominal pain, blood in stool, constipation, diarrhea, nausea and vomiting.  Genitourinary: Negative for difficulty urinating, dysuria, flank pain, frequency, hematuria, pelvic pain, urgency, vaginal bleeding, vaginal discharge and vaginal pain.  Musculoskeletal: Negative for arthralgias, gait problem and joint swelling.  Skin: Positive for wound. Negative for rash.  Neurological: Negative for dizziness, syncope, speech difficulty, weakness, numbness and headaches.  Hematological: Negative for adenopathy.  Psychiatric/Behavioral: Negative for agitation, behavioral problems and dysphoric mood. The patient is not nervous/anxious.        Objective:   Physical Exam  Constitutional: She appears well-developed and well-nourished. No distress.  Blood pressure 130/80  HENT:  Both nares unremarkable  Skin:  2.5 cm wound involving the right lower anterior leg with a dry eschar.  No surrounding erythema or drainage            Assessment & Plan:   Essential hypertension stable History of epistaxis has not reoccurred Clean superficial traumatic wound right anterior lower leg.  Wound was cleaned and  dressed.  Local wound care discussed  Follow-up 3 months  Shelley Robinson [name]

## 2017-09-16 NOTE — Patient Instructions (Signed)
Limit your sodium (Salt) intake  Please check your blood pressure on a regular basis.  If it is consistently greater than 150/90, please make an office appointment.  Return in 3 months for follow-up  

## 2017-10-13 DIAGNOSIS — L03115 Cellulitis of right lower limb: Secondary | ICD-10-CM | POA: Diagnosis not present

## 2017-10-14 ENCOUNTER — Ambulatory Visit (INDEPENDENT_AMBULATORY_CARE_PROVIDER_SITE_OTHER): Payer: Medicare Other | Admitting: Internal Medicine

## 2017-10-14 ENCOUNTER — Other Ambulatory Visit: Payer: Self-pay

## 2017-10-14 ENCOUNTER — Telehealth: Payer: Self-pay

## 2017-10-14 ENCOUNTER — Encounter: Payer: Self-pay | Admitting: Internal Medicine

## 2017-10-14 VITALS — BP 130/80 | Temp 98.4°F | Wt 179.0 lb

## 2017-10-14 DIAGNOSIS — I10 Essential (primary) hypertension: Secondary | ICD-10-CM | POA: Diagnosis not present

## 2017-10-14 MED ORDER — LOSARTAN POTASSIUM-HCTZ 100-12.5 MG PO TABS: 1.0000 | ORAL_TABLET | Freq: Every day | ORAL | 3 refills | Status: DC

## 2017-10-14 NOTE — Telephone Encounter (Signed)
Copied from [place]. Topic: Appointment Scheduling - Scheduling Inquiry for Clinic >> Oct 13, 2017 11:49 AM Boyd Kerbs wrote: Reason for CRM:   Pt. Is needing to see Dr. Raliegh Ip today, her leg is worse (hurt it in Feb). Red, warm to touch, swollen and on scale 1-10 she saying 10.   She is also saying she thinks her medicine may be too strong for her. May need 30 minutes  Only wants to see Dr. Raliegh Ip. She said he told her if she needed him he would work her in.

## 2017-10-14 NOTE — Progress Notes (Signed)
Subjective:    Patient ID: Shelley Robinson, female    DOB: [birth year], 84 y.o.   MRN: [id number]  HPI  84 year old patient who is seen today for follow-up of hypertension.  She was seen earlier with an elevated blood pressure and losartan 50-hydrochlorothiazide 12.5 was doubled.  She noted increase in urinary frequency including nocturia.  She has subsequently decreased to prior dose and her urinary symptoms have resolved  She was seen yesterday in urgent care and placed on doxycycline due to a cellulitis of all the right lower leg.  She has developed increasing pain erythema and excessive warmth about a dry eschar involving the right anterolateral lower leg  Past Medical History:  Diagnosis Date  . Anxiety   . Arthritis   . GERD (gastroesophageal reflux disease)   . Hyperlipidemia   . Hypertension   . Osteopenia      Social History   Socioeconomic History  . Marital status: Widowed    Spouse name: Not on file  . Number of children: Not on file  . Years of education: Not on file  . Highest education level: Not on file  Occupational History  . Not on file  Social Needs  . Financial resource strain: Not on file  . Food insecurity:    Worry: Not on file    Inability: Not on file  . Transportation needs:    Medical: Not on file    Non-medical: Not on file  Tobacco Use  . Smoking status: Former Smoker    Last attempt to quit: 07/08/1978    Years since quitting: 39.2  . Smokeless tobacco: Never Used  Substance and Sexual Activity  . Alcohol use: Yes    Alcohol/week: 0.6 oz    Types: 1 Glasses of wine per week    Comment: once or twice a month  . Drug use: No  . Sexual activity: Never    Partners: Male    Birth control/protection: Post-menopausal  Lifestyle  . Physical activity:    Days per week: Not on file    Minutes per session: Not on file  . Stress: Not on file  Relationships  . Social connections:    Talks on phone: Not on file    Gets together: Not on  file    Attends religious service: Not on file    Active member of club or organization: Not on file    Attends meetings of clubs or organizations: Not on file    Relationship status: Not on file  . Intimate partner violence:    Fear of current or ex partner: Not on file    Emotionally abused: Not on file    Physically abused: Not on file    Forced sexual activity: Not on file  Other Topics Concern  . Not on file  Social History Narrative  . Not on file    Past Surgical History:  Procedure Laterality Date  . ABDOMINAL HYSTERECTOMY    . CHOLECYSTECTOMY    . JOINT REPLACEMENT    . TONSILLECTOMY      Family History  Problem Relation Age of Onset  . Alzheimer's disease Mother   . Heart disease Father   . Cancer Sister   . Lymphoma Sister     Allergies  Allergen Reactions  . Lisinopril Cough  . Piroxicam Other (See Comments)    unknown  . Prednisone Other (See Comments)    hypertensive  . Penicillins Rash    Current Outpatient Medications on  File Prior to Visit  Medication Sig Dispense Refill  . acetaminophen (TYLENOL) 500 MG tablet Take 1 tablet (500 mg total) by mouth every 6 (six) hours as needed for pain. Take one  in AM and one at qhs, and one at noon if needed 30 tablet 0  . aspirin 81 MG EC tablet Take 81 mg by mouth daily.     Marland Kitchen conjugated estrogens (PREMARIN) vaginal cream Place 1 Applicatorful vaginally as needed. 42.5 g 3  . diphenhydramine-acetaminophen (TYLENOL PM) 25-500 MG TABS tablet Take 1 tablet by mouth at bedtime as needed.    . loratadine (CLARITIN) 10 MG tablet Take 10 mg by mouth daily. Not sure of the mg/lc    . meloxicam (MOBIC) 15 MG tablet     . Multiple Vitamins-Minerals (PRESERVISION AREDS 2 PO) Take 2 tablets by mouth daily.     . mupirocin ointment (BACTROBAN) 2 % Apply 1 application topically 2 (two) times daily as needed. 30 g 5  . NEOMYCIN-POLYMYXIN-HYDROCORTISONE (CORTISPORIN) 1 % SOLN OTIC solution 3.5-10,000-1 mg-unit/mL-%    .  omeprazole (PRILOSEC) 20 MG capsule Take by mouth.    . pantoprazole (PROTONIX) 40 MG tablet TAKE ONE TABLET BY MOUTH DAILY 90 tablet 2  . rosuvastatin (CRESTOR) 10 MG tablet TAKE 1 TABLET (10 MG TOTAL) BY MOUTH DAILY. 90 tablet 2  . SHINGRIX injection LOT                     RA/LA                   EXP                    MANU  1  . traZODone (DESYREL) 50 MG tablet trazodone 50 mg tablet  Take 1 tablet as needed by oral route for 30 days.    . Triamcinolone Acetonide (NASACORT ALLERGY 24HR NA) Place 1 spray into the nose daily.    Marland Kitchen triamcinolone cream (KENALOG) 0.1 % Apply 1 application topically 2 (two) times daily. 80 g 0  . valACYclovir (VALTREX) 1000 MG tablet TAKE 1 TABLET BY MOUTH TWICE A DAY FOR 1 DAY. 30 tablet 4   No current facility-administered medications on file prior to visit.     BP 130/80 (BP Location: Right Arm, Patient Position: Sitting, Cuff Size: Large)   Temp 98.4 F (36.9 C) (Oral)   Wt 179 lb (81.2 kg)   BMI 34.96 kg/m     Review of Systems  Constitutional: Positive for activity change.  Genitourinary: Positive for difficulty urinating and frequency.  Skin: Positive for rash and wound.       Objective:   Physical Exam  Constitutional: She appears well-developed and well-nourished. No distress.  Blood pressure 150/84  Skin:  1 x 2 cm superficial dry eschar without drainage involving the right anterolateral lower leg.  There is now a surrounding rim of erythema with tenderness and excessive warmth          Assessment & Plan:   Essential hypertension.  Patient experienced increase in urinary frequency on 25 mg of hydrochlorothiazide.  The symptoms have resolved with decrease in dose to 12.5 mg Will change antihypertensive regimen to losartan 100 with 12.5 mg of hydrochlorothiazide  Right lower leg cellulitis.  Will complete antibiotic therapy  Follow-up 3 months or as needed  Nyoka Cowden

## 2017-10-14 NOTE — Patient Instructions (Addendum)
Limit your sodium (Salt) intake  Change blood pressure medication to losartan 100-HCT 12.5 1 tablet daily  Take your antibiotic as prescribed until ALL of it is gone, but stop if you develop a rash, swelling, or any side effects of the medication.  Contact our office as soon as possible if  there are side effects of the medication.  Is yes

## 2017-10-14 NOTE — Telephone Encounter (Signed)
Spoke to patient and she went to the urgent care. Patient was diagnosed with cellulitis. Patient made an appointment for follow-up regarding Hyzaar.

## 2017-10-15 DIAGNOSIS — H353221 Exudative age-related macular degeneration, left eye, with active choroidal neovascularization: Secondary | ICD-10-CM | POA: Diagnosis not present

## 2017-10-15 DIAGNOSIS — H353211 Exudative age-related macular degeneration, right eye, with active choroidal neovascularization: Secondary | ICD-10-CM | POA: Diagnosis not present

## 2017-10-21 DIAGNOSIS — L03115 Cellulitis of right lower limb: Secondary | ICD-10-CM | POA: Diagnosis not present

## 2017-10-27 DIAGNOSIS — M25511 Pain in right shoulder: Secondary | ICD-10-CM | POA: Diagnosis not present

## 2017-10-27 DIAGNOSIS — I803 Phlebitis and thrombophlebitis of lower extremities, unspecified: Secondary | ICD-10-CM | POA: Diagnosis not present

## 2017-10-27 DIAGNOSIS — Z6831 Body mass index (BMI) 31.0-31.9, adult: Secondary | ICD-10-CM | POA: Diagnosis not present

## 2017-11-03 DIAGNOSIS — I803 Phlebitis and thrombophlebitis of lower extremities, unspecified: Secondary | ICD-10-CM | POA: Diagnosis not present

## 2017-11-03 DIAGNOSIS — Z6831 Body mass index (BMI) 31.0-31.9, adult: Secondary | ICD-10-CM | POA: Diagnosis not present

## 2017-11-13 DIAGNOSIS — I803 Phlebitis and thrombophlebitis of lower extremities, unspecified: Secondary | ICD-10-CM | POA: Diagnosis not present

## 2017-11-13 DIAGNOSIS — S90562A Insect bite (nonvenomous), left ankle, initial encounter: Secondary | ICD-10-CM | POA: Diagnosis not present

## 2017-11-13 DIAGNOSIS — Z6831 Body mass index (BMI) 31.0-31.9, adult: Secondary | ICD-10-CM | POA: Diagnosis not present

## 2017-12-09 DIAGNOSIS — M79674 Pain in right toe(s): Secondary | ICD-10-CM | POA: Diagnosis not present

## 2017-12-09 DIAGNOSIS — L602 Onychogryphosis: Secondary | ICD-10-CM | POA: Diagnosis not present

## 2017-12-09 DIAGNOSIS — M2042 Other hammer toe(s) (acquired), left foot: Secondary | ICD-10-CM | POA: Diagnosis not present

## 2017-12-09 DIAGNOSIS — I739 Peripheral vascular disease, unspecified: Secondary | ICD-10-CM | POA: Diagnosis not present

## 2017-12-09 DIAGNOSIS — M79675 Pain in left toe(s): Secondary | ICD-10-CM | POA: Diagnosis not present

## 2017-12-09 DIAGNOSIS — B353 Tinea pedis: Secondary | ICD-10-CM | POA: Diagnosis not present

## 2017-12-09 DIAGNOSIS — B351 Tinea unguium: Secondary | ICD-10-CM | POA: Diagnosis not present

## 2017-12-09 DIAGNOSIS — M2041 Other hammer toe(s) (acquired), right foot: Secondary | ICD-10-CM | POA: Diagnosis not present

## 2017-12-09 DIAGNOSIS — L603 Nail dystrophy: Secondary | ICD-10-CM | POA: Diagnosis not present

## 2017-12-10 DIAGNOSIS — H353221 Exudative age-related macular degeneration, left eye, with active choroidal neovascularization: Secondary | ICD-10-CM | POA: Diagnosis not present

## 2017-12-11 ENCOUNTER — Encounter: Payer: Self-pay | Admitting: Internal Medicine

## 2017-12-11 ENCOUNTER — Ambulatory Visit (INDEPENDENT_AMBULATORY_CARE_PROVIDER_SITE_OTHER): Payer: Medicare Other | Admitting: Internal Medicine

## 2017-12-11 VITALS — BP 122/78 | HR 94 | Temp 97.8°F | Wt 177.0 lb

## 2017-12-11 DIAGNOSIS — M150 Primary generalized (osteo)arthritis: Secondary | ICD-10-CM

## 2017-12-11 DIAGNOSIS — M159 Polyosteoarthritis, unspecified: Secondary | ICD-10-CM

## 2017-12-11 DIAGNOSIS — Z8679 Personal history of other diseases of the circulatory system: Secondary | ICD-10-CM

## 2017-12-11 DIAGNOSIS — I10 Essential (primary) hypertension: Secondary | ICD-10-CM

## 2017-12-11 NOTE — Patient Instructions (Signed)
Limit your sodium (Salt) intake  Please check your blood pressure on a regular basis.  If it is consistently greater than 150/90, please make an office appointment.  Return in 3 months for follow-up  

## 2017-12-11 NOTE — Progress Notes (Signed)
Subjective:    Patient ID: Shelley Robinson, female    DOB: [birth year], 84 y.o.   MRN: [id number]  HPI  84 year old patient who is seen today for follow-up.  She has essential hypertension.  Earlier this spring she was treated for a cellulitis involving the right lower leg that has resolved.  She feels very well today.  Past Medical History:  Diagnosis Date  . Anxiety   . Arthritis   . GERD (gastroesophageal reflux disease)   . Hyperlipidemia   . Hypertension   . Osteopenia      Social History   Socioeconomic History  . Marital status: Widowed    Spouse name: Not on file  . Number of children: Not on file  . Years of education: Not on file  . Highest education level: Not on file  Occupational History  . Not on file  Social Needs  . Financial resource strain: Not on file  . Food insecurity:    Worry: Not on file    Inability: Not on file  . Transportation needs:    Medical: Not on file    Non-medical: Not on file  Tobacco Use  . Smoking status: Former Smoker    Last attempt to quit: 07/08/1978    Years since quitting: 39.4  . Smokeless tobacco: Never Used  Substance and Sexual Activity  . Alcohol use: Yes    Alcohol/week: 0.6 oz    Types: 1 Glasses of wine per week    Comment: once or twice a month  . Drug use: No  . Sexual activity: Never    Partners: Male    Birth control/protection: Post-menopausal  Lifestyle  . Physical activity:    Days per week: Not on file    Minutes per session: Not on file  . Stress: Not on file  Relationships  . Social connections:    Talks on phone: Not on file    Gets together: Not on file    Attends religious service: Not on file    Active member of club or organization: Not on file    Attends meetings of clubs or organizations: Not on file    Relationship status: Not on file  . Intimate partner violence:    Fear of current or ex partner: Not on file    Emotionally abused: Not on file    Physically abused: Not on file   Forced sexual activity: Not on file  Other Topics Concern  . Not on file  Social History Narrative  . Not on file    Past Surgical History:  Procedure Laterality Date  . ABDOMINAL HYSTERECTOMY    . CHOLECYSTECTOMY    . JOINT REPLACEMENT    . TONSILLECTOMY      Family History  Problem Relation Age of Onset  . Alzheimer's disease Mother   . Heart disease Father   . Cancer Sister   . Lymphoma Sister     Allergies  Allergen Reactions  . Codeine Other (See Comments)  . Metaxalone Other (See Comments)  . Naproxen Other (See Comments)  . Lisinopril Cough  . Piroxicam Other (See Comments)    unknown  . Prednisone Other (See Comments)    hypertensive  . Penicillins Rash    Current Outpatient Medications on File Prior to Visit  Medication Sig Dispense Refill  . acetaminophen (TYLENOL) 500 MG tablet Take 1 tablet (500 mg total) by mouth every 6 (six) hours as needed for pain. Take one  in AM and  one at qhs, and one at noon if needed 30 tablet 0  . aspirin 81 MG EC tablet Take 81 mg by mouth daily.     Marland Kitchen conjugated estrogens (PREMARIN) vaginal cream Place 1 Applicatorful vaginally as needed. 42.5 g 3  . diphenhydramine-acetaminophen (TYLENOL PM) 25-500 MG TABS tablet Take 1 tablet by mouth at bedtime as needed.    . loratadine (CLARITIN) 10 MG tablet Take 10 mg by mouth daily. Not sure of the mg/lc    . losartan-hydrochlorothiazide (HYZAAR) 100-12.5 MG tablet Take 1 tablet by mouth daily. 90 tablet 3  . meloxicam (MOBIC) 15 MG tablet     . Multiple Vitamins-Minerals (PRESERVISION AREDS 2 PO) Take 2 tablets by mouth daily.     . mupirocin ointment (BACTROBAN) 2 % Apply 1 application topically 2 (two) times daily as needed. 30 g 5  . NEOMYCIN-POLYMYXIN-HYDROCORTISONE (CORTISPORIN) 1 % SOLN OTIC solution 3.5-10,000-1 mg-unit/mL-%    . omeprazole (PRILOSEC) 20 MG capsule Take by mouth.    . pantoprazole (PROTONIX) 40 MG tablet TAKE ONE TABLET BY MOUTH DAILY 90 tablet 2  .  rosuvastatin (CRESTOR) 10 MG tablet TAKE 1 TABLET (10 MG TOTAL) BY MOUTH DAILY. 90 tablet 2  . SHINGRIX injection LOT                     RA/LA                   EXP                    MANU  1  . traZODone (DESYREL) 50 MG tablet trazodone 50 mg tablet  Take 1 tablet as needed by oral route for 30 days.    . Triamcinolone Acetonide (NASACORT ALLERGY 24HR NA) Place 1 spray into the nose daily.    Marland Kitchen triamcinolone cream (KENALOG) 0.1 % Apply 1 application topically 2 (two) times daily. 80 g 0  . valACYclovir (VALTREX) 1000 MG tablet TAKE 1 TABLET BY MOUTH TWICE A DAY FOR 1 DAY. 30 tablet 4   No current facility-administered medications on file prior to visit.     BP 122/78 (BP Location: Right Arm, Patient Position: Sitting, Cuff Size: Large)   Pulse 94   Temp 97.8 F (36.6 C) (Oral)   Wt 177 lb (80.3 kg)   SpO2 97%   BMI 34.57 kg/m     Review of Systems  Constitutional: Negative.   HENT: Negative for congestion, dental problem, hearing loss, rhinorrhea, sinus pressure, sore throat and tinnitus.   Eyes: Negative for pain, discharge and visual disturbance.  Respiratory: Negative for cough and shortness of breath.   Cardiovascular: Negative for chest pain, palpitations and leg swelling.  Gastrointestinal: Negative for abdominal distention, abdominal pain, blood in stool, constipation, diarrhea, nausea and vomiting.  Genitourinary: Negative for difficulty urinating, dysuria, flank pain, frequency, hematuria, pelvic pain, urgency, vaginal bleeding, vaginal discharge and vaginal pain.  Musculoskeletal: Negative for arthralgias, gait problem and joint swelling.  Skin: Negative for rash.  Neurological: Negative for dizziness, syncope, speech difficulty, weakness, numbness and headaches.  Hematological: Negative for adenopathy.  Psychiatric/Behavioral: Negative for agitation, behavioral problems and dysphoric mood. The patient is not nervous/anxious.        Objective:   Physical Exam    Constitutional: She is oriented to person, place, and time. She appears well-developed and well-nourished.  Blood pressure well controlled  HENT:  Head: Normocephalic.  Right Ear: External ear normal.  Left Ear:  External ear normal.  Mouth/Throat: Oropharynx is clear and moist.  Eyes: Pupils are equal, round, and reactive to light. Conjunctivae and EOM are normal.  Neck: Normal range of motion. Neck supple. No thyromegaly present.  Cardiovascular: Normal rate, regular rhythm, normal heart sounds and intact distal pulses.  Pulmonary/Chest: Effort normal and breath sounds normal.  Abdominal: Soft. Bowel sounds are normal. She exhibits no mass. There is no tenderness.  Musculoskeletal: Normal range of motion. She exhibits edema.  +1 lower extremity edema distal to the knees  Lymphadenopathy:    She has no cervical adenopathy.  Neurological: She is alert and oriented to person, place, and time.  Skin: Skin is warm and dry. No rash noted.  Psychiatric: She has a normal mood and affect. Her behavior is normal.          Assessment & Plan:   Essential hypertension well-controlled History of right lower leg cellulitis resolved  Medications updated Continue restricted salt diet  Follow-up 3 to 6 months  Zakar Brosch [name]

## 2017-12-16 ENCOUNTER — Ambulatory Visit: Payer: Medicare Other | Admitting: Internal Medicine

## 2017-12-17 ENCOUNTER — Other Ambulatory Visit: Payer: Self-pay | Admitting: Internal Medicine

## 2017-12-22 DIAGNOSIS — L603 Nail dystrophy: Secondary | ICD-10-CM | POA: Diagnosis not present

## 2018-02-04 DIAGNOSIS — H353221 Exudative age-related macular degeneration, left eye, with active choroidal neovascularization: Secondary | ICD-10-CM | POA: Diagnosis not present

## 2018-02-20 DIAGNOSIS — M79674 Pain in right toe(s): Secondary | ICD-10-CM | POA: Diagnosis not present

## 2018-02-20 DIAGNOSIS — B351 Tinea unguium: Secondary | ICD-10-CM | POA: Diagnosis not present

## 2018-02-20 DIAGNOSIS — M79675 Pain in left toe(s): Secondary | ICD-10-CM | POA: Diagnosis not present

## 2018-02-20 DIAGNOSIS — I739 Peripheral vascular disease, unspecified: Secondary | ICD-10-CM | POA: Diagnosis not present

## 2018-03-18 DIAGNOSIS — H353231 Exudative age-related macular degeneration, bilateral, with active choroidal neovascularization: Secondary | ICD-10-CM | POA: Diagnosis not present

## 2018-03-18 DIAGNOSIS — H524 Presbyopia: Secondary | ICD-10-CM | POA: Diagnosis not present

## 2018-03-18 DIAGNOSIS — Z961 Presence of intraocular lens: Secondary | ICD-10-CM | POA: Diagnosis not present

## 2018-03-19 ENCOUNTER — Ambulatory Visit: Payer: Medicare Other | Admitting: Internal Medicine

## 2018-03-19 DIAGNOSIS — H353211 Exudative age-related macular degeneration, right eye, with active choroidal neovascularization: Secondary | ICD-10-CM | POA: Diagnosis not present

## 2018-03-19 DIAGNOSIS — H353221 Exudative age-related macular degeneration, left eye, with active choroidal neovascularization: Secondary | ICD-10-CM | POA: Diagnosis not present

## 2018-03-23 ENCOUNTER — Ambulatory Visit (INDEPENDENT_AMBULATORY_CARE_PROVIDER_SITE_OTHER): Payer: Medicare Other | Admitting: Internal Medicine

## 2018-03-23 ENCOUNTER — Encounter: Payer: Self-pay | Admitting: Internal Medicine

## 2018-03-23 VITALS — BP 130/80 | HR 88 | Temp 97.9°F | Wt 178.2 lb

## 2018-03-23 DIAGNOSIS — M545 Low back pain: Secondary | ICD-10-CM | POA: Diagnosis not present

## 2018-03-23 DIAGNOSIS — E785 Hyperlipidemia, unspecified: Secondary | ICD-10-CM

## 2018-03-23 DIAGNOSIS — G8929 Other chronic pain: Secondary | ICD-10-CM

## 2018-03-23 DIAGNOSIS — I10 Essential (primary) hypertension: Secondary | ICD-10-CM | POA: Diagnosis not present

## 2018-03-23 DIAGNOSIS — Z8679 Personal history of other diseases of the circulatory system: Secondary | ICD-10-CM | POA: Diagnosis not present

## 2018-03-23 MED ORDER — LOSARTAN POTASSIUM-HCTZ 100-12.5 MG PO TABS: 1.0000 | ORAL_TABLET | Freq: Every day | ORAL | 3 refills | Status: DC

## 2018-03-23 MED ORDER — PANTOPRAZOLE SODIUM 40 MG PO TBEC: 40.0000 mg | DELAYED_RELEASE_TABLET | Freq: Every day | ORAL | 3 refills | Status: DC

## 2018-03-23 MED ORDER — TRAZODONE HCL 50 MG PO TABS: ORAL_TABLET | ORAL | 2 refills | Status: DC

## 2018-03-23 MED ORDER — ROSUVASTATIN CALCIUM 10 MG PO TABS: 10.0000 mg | ORAL_TABLET | Freq: Every day | ORAL | 3 refills | Status: DC

## 2018-03-23 NOTE — Progress Notes (Signed)
Subjective:    Patient ID: Shelley Robinson, female    DOB: [birth year], 84 y.o.   MRN: [id number]  HPI  84 year old patient who is seen today for follow-up.  She has a history of essential hypertension as well as diastolic dysfunction.  She has treated dyslipidemia. She has a history of osteoarthritis as well as low back pain. In general her status seems fairly stable.  Only complaint today really is some mild reflux symptoms.  Medical regimen does include PPI therapy. No cardiopulmonary complaints  Past Medical History:  Diagnosis Date  . Anxiety   . Arthritis   . GERD (gastroesophageal reflux disease)   . Hyperlipidemia   . Hypertension   . Osteopenia      Social History   Socioeconomic History  . Marital status: Widowed    Spouse name: Not on file  . Number of children: Not on file  . Years of education: Not on file  . Highest education level: Not on file  Occupational History  . Not on file  Social Needs  . Financial resource strain: Not on file  . Food insecurity:    Worry: Not on file    Inability: Not on file  . Transportation needs:    Medical: Not on file    Non-medical: Not on file  Tobacco Use  . Smoking status: Former Smoker    Last attempt to quit: 07/08/1978    Years since quitting: 39.7  . Smokeless tobacco: Never Used  Substance and Sexual Activity  . Alcohol use: Yes    Alcohol/week: 1.0 standard drinks    Types: 1 Glasses of wine per week    Comment: once or twice a month  . Drug use: No  . Sexual activity: Never    Partners: Male    Birth control/protection: Post-menopausal  Lifestyle  . Physical activity:    Days per week: Not on file    Minutes per session: Not on file  . Stress: Not on file  Relationships  . Social connections:    Talks on phone: Not on file    Gets together: Not on file    Attends religious service: Not on file    Active member of club or organization: Not on file    Attends meetings of clubs or organizations:  Not on file    Relationship status: Not on file  . Intimate partner violence:    Fear of current or ex partner: Not on file    Emotionally abused: Not on file    Physically abused: Not on file    Forced sexual activity: Not on file  Other Topics Concern  . Not on file  Social History Narrative  . Not on file    Past Surgical History:  Procedure Laterality Date  . ABDOMINAL HYSTERECTOMY    . CHOLECYSTECTOMY    . JOINT REPLACEMENT    . TONSILLECTOMY      Family History  Problem Relation Age of Onset  . Alzheimer's disease Mother   . Heart disease Father   . Cancer Sister   . Lymphoma Sister     Allergies  Allergen Reactions  . Codeine Other (See Comments)  . Metaxalone Other (See Comments)  . Naproxen Other (See Comments)  . Lisinopril Cough  . Piroxicam Other (See Comments)    unknown  . Prednisone Other (See Comments)    hypertensive  . Penicillins Rash    Current Outpatient Medications on File Prior to Visit  Medication Sig  Dispense Refill  . acetaminophen (TYLENOL) 500 MG tablet Take 1 tablet (500 mg total) by mouth every 6 (six) hours as needed for pain. Take one  in AM and one at qhs, and one at noon if needed 30 tablet 0  . aspirin 81 MG EC tablet Take 81 mg by mouth daily.     Marland Kitchen conjugated estrogens (PREMARIN) vaginal cream Place 1 Applicatorful vaginally as needed. 42.5 g 3  . diphenhydramine-acetaminophen (TYLENOL PM) 25-500 MG TABS tablet Take 1 tablet by mouth at bedtime as needed.    . loratadine (CLARITIN) 10 MG tablet Take 10 mg by mouth daily. Not sure of the mg/lc    . meloxicam (MOBIC) 15 MG tablet     . Multiple Vitamins-Minerals (PRESERVISION AREDS 2 PO) Take 2 tablets by mouth daily.     Marland Kitchen SHINGRIX injection LOT                     RA/LA                   EXP                    MANU  1  . Triamcinolone Acetonide (NASACORT ALLERGY 24HR NA) Place 1 spray into the nose daily.     No current facility-administered medications on file prior to visit.      BP 130/80 (BP Location: Right Arm, Patient Position: Sitting, Cuff Size: Large)   Pulse 88   Temp 97.9 F (36.6 C) (Oral)   Wt 178 lb 3.2 oz (80.8 kg)   SpO2 94%   BMI 34.80 kg/m     Review of Systems  Constitutional: Negative.   HENT: Negative for congestion, dental problem, hearing loss, rhinorrhea, sinus pressure, sore throat and tinnitus.   Eyes: Negative for pain, discharge and visual disturbance.  Respiratory: Negative for cough and shortness of breath.   Cardiovascular: Negative for chest pain, palpitations and leg swelling.  Gastrointestinal: Negative for abdominal distention, abdominal pain, blood in stool, constipation, diarrhea, nausea and vomiting.  Genitourinary: Negative for difficulty urinating, dysuria, flank pain, frequency, hematuria, pelvic pain, urgency, vaginal bleeding, vaginal discharge and vaginal pain.  Musculoskeletal: Negative for arthralgias, gait problem and joint swelling.  Skin: Negative for rash.  Neurological: Negative for dizziness, syncope, speech difficulty, weakness, numbness and headaches.  Hematological: Negative for adenopathy.  Psychiatric/Behavioral: Negative for agitation, behavioral problems and dysphoric mood. The patient is not nervous/anxious.        Objective:   Physical Exam  Constitutional: She is oriented to person, place, and time. She appears well-developed and well-nourished.  HENT:  Head: Normocephalic.  Right Ear: External ear normal.  Left Ear: External ear normal.  Mouth/Throat: Oropharynx is clear and moist.  Eyes: Pupils are equal, round, and reactive to light. Conjunctivae and EOM are normal.  Neck: Normal range of motion. Neck supple. No thyromegaly present.  Cardiovascular: Normal rate, regular rhythm and normal heart sounds.  Pulmonary/Chest: Effort normal and breath sounds normal.  Abdominal: Soft. Bowel sounds are normal. She exhibits no mass. There is no tenderness.  Musculoskeletal: Normal range of  motion.  Lymphadenopathy:    She has no cervical adenopathy.  Neurological: She is alert and oriented to person, place, and time.  Skin: Skin is warm and dry. No rash noted.  Psychiatric: She has a normal mood and affect. Her behavior is normal.          Assessment & Plan:  Coronary artery disease Essential hypertension well-controlled History of diastolic dysfunction stable Osteoarthritis  Medications updated Patient has been asked to follow-up with her new PCP in approximately 6 months  Marletta Lor

## 2018-03-23 NOTE — Patient Instructions (Signed)
Limit your sodium (Salt) intake  Avoids foods high in acid such as tomatoes citrus juices, and spicy foods.  Avoid eating within two hours of lying down or before exercising.  Do not overheat.  Try smaller more frequent meals.   Please check your blood pressure on a regular basis.  If it is consistently greater than 140/90, please make an office appointment.

## 2018-04-01 DIAGNOSIS — H353211 Exudative age-related macular degeneration, right eye, with active choroidal neovascularization: Secondary | ICD-10-CM | POA: Diagnosis not present

## 2018-04-01 DIAGNOSIS — H353221 Exudative age-related macular degeneration, left eye, with active choroidal neovascularization: Secondary | ICD-10-CM | POA: Diagnosis not present

## 2018-04-10 DIAGNOSIS — Z23 Encounter for immunization: Secondary | ICD-10-CM | POA: Diagnosis not present

## 2018-04-16 DIAGNOSIS — H43813 Vitreous degeneration, bilateral: Secondary | ICD-10-CM | POA: Diagnosis not present

## 2018-04-16 DIAGNOSIS — H35423 Microcystoid degeneration of retina, bilateral: Secondary | ICD-10-CM | POA: Diagnosis not present

## 2018-04-16 DIAGNOSIS — H35033 Hypertensive retinopathy, bilateral: Secondary | ICD-10-CM | POA: Diagnosis not present

## 2018-04-16 DIAGNOSIS — H353232 Exudative age-related macular degeneration, bilateral, with inactive choroidal neovascularization: Secondary | ICD-10-CM | POA: Diagnosis not present

## 2018-04-29 DIAGNOSIS — M170 Bilateral primary osteoarthritis of knee: Secondary | ICD-10-CM | POA: Diagnosis not present

## 2018-05-20 ENCOUNTER — Ambulatory Visit: Payer: Medicare Other

## 2018-05-20 ENCOUNTER — Ambulatory Visit (INDEPENDENT_AMBULATORY_CARE_PROVIDER_SITE_OTHER): Payer: Medicare Other | Admitting: Family Medicine

## 2018-05-20 ENCOUNTER — Encounter: Payer: Self-pay | Admitting: Family Medicine

## 2018-05-20 ENCOUNTER — Ambulatory Visit (INDEPENDENT_AMBULATORY_CARE_PROVIDER_SITE_OTHER): Payer: Medicare Other

## 2018-05-20 VITALS — BP 120/83 | HR 67 | Temp 97.7°F | Resp 16 | Ht 60.0 in | Wt 175.0 lb

## 2018-05-20 DIAGNOSIS — M25511 Pain in right shoulder: Secondary | ICD-10-CM

## 2018-05-20 DIAGNOSIS — S4991XA Unspecified injury of right shoulder and upper arm, initial encounter: Secondary | ICD-10-CM | POA: Diagnosis not present

## 2018-05-20 DIAGNOSIS — S8991XA Unspecified injury of right lower leg, initial encounter: Secondary | ICD-10-CM | POA: Diagnosis not present

## 2018-05-20 DIAGNOSIS — I10 Essential (primary) hypertension: Secondary | ICD-10-CM | POA: Diagnosis not present

## 2018-05-20 DIAGNOSIS — M150 Primary generalized (osteo)arthritis: Secondary | ICD-10-CM

## 2018-05-20 DIAGNOSIS — W19XXXA Unspecified fall, initial encounter: Secondary | ICD-10-CM | POA: Diagnosis not present

## 2018-05-20 DIAGNOSIS — M159 Polyosteoarthritis, unspecified: Secondary | ICD-10-CM

## 2018-05-20 DIAGNOSIS — S8011XA Contusion of right lower leg, initial encounter: Secondary | ICD-10-CM | POA: Diagnosis not present

## 2018-05-20 MED ORDER — DICLOFENAC SODIUM 1 % TD GEL: 4.0000 g | Freq: Four times a day (QID) | TRANSDERMAL | 3 refills | Status: AC

## 2018-05-20 NOTE — Patient Instructions (Addendum)
A few things to remember from today's visit:   Essential hypertension - Plan: Comprehensive metabolic panel  Dyslipidemia  Primary osteoarthritis involving multiple joints  Fall, initial encounter - Plan: DG Tibia/Fibula Left, DG Shoulder Left  Right shoulder pain, unspecified chronicity - Plan: DG Shoulder Left  Injury of right lower extremity, initial encounter - Plan: DG Tibia/Fibula Left    Please be sure medication list is accurate. If a new problem present, please set up appointment sooner than planned today.

## 2018-05-20 NOTE — Progress Notes (Addendum)
HPI:  Chief Complaint  Patient presents with  . Follow-up    follow-up after fall    ShelleyShelley Robinson is a 84 y.o. female, who is here today c/o right LE pain after fall on 05/14/18 when leaving the [employer]. She was walking in the parking lot, turned fast and lost balance. Landed on right side. Customers witnessed fall and helped her get up, she did not feel like she needed to seek medical attention.  Pain started a couple days after fall.  Hx of OA.  Right knee and distal leg seem to be getting worse since yesterday. Also c/o right shoulder pain and limitation of movement.  Pain is intermittent, pain is " bad."  Pain is exacerbated by movement and alleviated by rest.  While she was living in [place] she was following with ortho,receiving knee injections.  She has been taking Tylenol.  Unstable gait,she uses a walker.    She has not followed on HTN or HLD since 11/2016.  Hypertension:  Currently on Hyzaar 100-12.5 mg daily.  She does not check BP regularly.. She is taking medications as instructed, no side effects reported.  She has not noted unusual headache, visual changes, exertional chest pain, dyspnea, or edema.   Lab Results  Component Value Date   CREATININE 1.06 11/20/2016   BUN 19 11/20/2016   NA 144 11/20/2016   K 4.2 11/20/2016   CL 104 11/20/2016   CO2 34 (H) 11/20/2016      Hyperlipidemia: Currently on Crestor 10 mg daily. Following a low fat diet: Yes..  She has not noted side effects with medication.  Lab Results  Component Value Date   CHOL 192 11/20/2016   HDL 67.60 11/20/2016   LDLCALC 89 11/20/2016   TRIG 179.0 (H) 11/20/2016   CHOLHDL 3 11/20/2016       Review of Systems  Constitutional: Negative for activity change, appetite change, fatigue and fever.  HENT: Negative for mouth sores, nosebleeds and trouble swallowing.   Eyes: Negative for redness and visual disturbance.  Respiratory: Negative for cough,  shortness of breath and wheezing.   Cardiovascular: Negative for chest pain, palpitations and leg swelling.  Gastrointestinal: Negative for abdominal pain, nausea and vomiting.       Negative for changes in bowel habits.  Genitourinary: Negative for decreased urine volume, dysuria and hematuria.  Musculoskeletal: Positive for arthralgias and gait problem.  Skin: Negative for rash and wound.  Neurological: Negative for syncope and weakness.  Hematological: Bruises/bleeds easily.  Psychiatric/Behavioral: Positive for sleep disturbance.    Current Outpatient Medications on File Prior to Visit  Medication Sig Dispense Refill  . acetaminophen (TYLENOL) 500 MG tablet Take 1 tablet (500 mg total) by mouth every 6 (six) hours as needed for pain. Take one  in AM and one at qhs, and one at noon if needed 30 tablet 0  . conjugated estrogens (PREMARIN) vaginal cream Place 1 Applicatorful vaginally as needed. 42.5 g 3  . diphenhydramine-acetaminophen (TYLENOL PM) 25-500 MG TABS tablet Take 1 tablet by mouth at bedtime as needed.    . loratadine (CLARITIN) 10 MG tablet Take 10 mg by mouth daily. Not sure of the mg/lc    . losartan-hydrochlorothiazide (HYZAAR) 100-12.5 MG tablet Take 1 tablet by mouth daily. 90 tablet 3  . Multiple Vitamins-Minerals (PRESERVISION AREDS 2 PO) Take 2 tablets by mouth daily.     . pantoprazole (PROTONIX) 40 MG tablet Take 1 tablet (40 mg total) by mouth  daily. 90 tablet 3  . rosuvastatin (CRESTOR) 10 MG tablet Take 1 tablet (10 mg total) by mouth daily. 90 tablet 3  . SHINGRIX injection LOT                     RA/LA                   EXP                    MANU  1  . traZODone (DESYREL) 50 MG tablet 1 tablet at bedtime as needed for sleep 90 tablet 2  . Triamcinolone Acetonide (NASACORT ALLERGY 24HR NA) Place 1 spray into the nose daily.     No current facility-administered medications on file prior to visit.      Past Medical History:  Diagnosis Date  . Anxiety   .  Arthritis   . GERD (gastroesophageal reflux disease)   . Hyperlipidemia   . Hypertension   . Osteopenia    Allergies  Allergen Reactions  . Codeine Other (See Comments)  . Metaxalone Other (See Comments)  . Naproxen Other (See Comments)  . Lisinopril Cough  . Piroxicam Other (See Comments)    unknown  . Prednisone Other (See Comments)    hypertensive  . Penicillins Rash    Social History   Socioeconomic History  . Marital status: Widowed    Spouse name: Not on file  . Number of children: Not on file  . Years of education: Not on file  . Highest education level: Not on file  Occupational History  . Not on file  Social Needs  . Financial resource strain: Not on file  . Food insecurity:    Worry: Not on file    Inability: Not on file  . Transportation needs:    Medical: Not on file    Non-medical: Not on file  Tobacco Use  . Smoking status: Former Smoker    Last attempt to quit: 07/08/1978    Years since quitting: 39.9  . Smokeless tobacco: Never Used  Substance and Sexual Activity  . Alcohol use: Yes    Alcohol/week: 1.0 standard drinks    Types: 1 Glasses of wine per week    Comment: once or twice a month  . Drug use: No  . Sexual activity: Never    Partners: Male    Birth control/protection: Post-menopausal  Lifestyle  . Physical activity:    Days per week: Not on file    Minutes per session: Not on file  . Stress: Not on file  Relationships  . Social connections:    Talks on phone: Not on file    Gets together: Not on file    Attends religious service: Not on file    Active member of club or organization: Not on file    Attends meetings of clubs or organizations: Not on file    Relationship status: Not on file  Other Topics Concern  . Not on file  Social History Narrative  . Not on file    Vitals:   05/20/18 1512  BP: 120/83  Pulse: 67  Resp: 16  Temp: 97.7 F (36.5 C)  SpO2: 97%   Body mass index is 34.18 kg/m.    Physical Exam    Nursing note and vitals reviewed. Constitutional: She is oriented to person, place, and time. She appears well-developed. No distress.  HENT:  Head: Normocephalic and atraumatic.  Mouth/Throat: Oropharynx is clear  and moist and mucous membranes are normal.  Eyes: Conjunctivae are normal.  Cardiovascular: Normal rate and regular rhythm.  No murmur heard. DP present bilateral.  Respiratory: Effort normal and breath sounds normal. No respiratory distress.  GI: Soft. She exhibits no mass. There is no tenderness.  Musculoskeletal: She exhibits no edema.       Right shoulder: She exhibits decreased range of motion, tenderness and bony tenderness.  Lymphadenopathy:    She has no cervical adenopathy.  Neurological: She is alert and oriented to person, place, and time. She has normal strength. No cranial nerve deficit. Gait abnormal.  Unstable gait assisted with a walker.  Skin: Skin is warm. Ecchymosis noted. No rash noted. No erythema.     Left pretibial area with ecchymosis.Marland Kitchen  Psychiatric: She has a normal mood and affect.  Well groomed, good eye contact.     ASSESSMENT AND PLAN:   Shelley Robinson was seen today c/o LLE pain and recent fall.  Orders Placed This Encounter  Procedures  . DG Tibia/Fibula Left  . DG Shoulder Right  . DG Tibia/Fibula Right  . Comprehensive metabolic panel    Fall, initial encounter Fall precautions discussed.  -     DG Shoulder Right; Future -     DG Tibia/Fibula Right; Future   Primary osteoarthritis involving multiple joints Aggravated by recent fall. She was instructed to continue Tylenol 500 mg qid prn.   Right shoulder pain, unspecified chronicity Marked limitation of ROM. Avoid oral NSAID's. Recommend topical Voltaren. Further recommendations will be given according to imaging results. She has an ortho in town.  -     diclofenac sodium (VOLTAREN) 1 % GEL; Apply 4 g topically 4 (four) times daily.  -     DG Shoulder Right;  Future   Essential hypertension Adequately controlled. No changes in current management. Continue low salt diet. F/U in 6 months with PCP, before if needed.  -     Comprehensive metabolic panel  Injury of right lower extremity, initial encounter Most likely soft tissue injury, she is concerned about fractures. LE elevation. Tylenol 500 mg qid prn.  -     DG Tibia/Fibula Right         Gale Klar G. Martinique, MD  [facility]. [place] office.

## 2018-05-21 ENCOUNTER — Ambulatory Visit: Payer: Self-pay | Admitting: *Deleted

## 2018-05-21 DIAGNOSIS — H35033 Hypertensive retinopathy, bilateral: Secondary | ICD-10-CM | POA: Diagnosis not present

## 2018-05-21 DIAGNOSIS — H35423 Microcystoid degeneration of retina, bilateral: Secondary | ICD-10-CM | POA: Diagnosis not present

## 2018-05-21 DIAGNOSIS — H43813 Vitreous degeneration, bilateral: Secondary | ICD-10-CM | POA: Diagnosis not present

## 2018-05-21 DIAGNOSIS — H353232 Exudative age-related macular degeneration, bilateral, with inactive choroidal neovascularization: Secondary | ICD-10-CM | POA: Diagnosis not present

## 2018-05-21 NOTE — Telephone Encounter (Signed)
Summary: applying cream   Patient's daughter, Shelley Robinson called and said that the patient does not understand where to put diclofenac sodium (VOLTAREN) 1 % GEL, Does she put it on her leg , shoulder or both? Please Advise.     Spoke with patient- she has a hard time understanding Dr Martinique. Told her she should let her know- she will not hurt her feelings.  Explained to patient the gel is antiinflammatory she can use it on her joints-shoulder and knee if needed. She has abrasion on shin- told her she could use antibacterial on that. Patient will follow up in December or sooner if needed.  (Chart note not finished at time of call advise given from OV note and nursing knowledge)

## 2018-05-21 NOTE — Telephone Encounter (Signed)
Noted.  Pt's questions have been answered.  Nothing further needed at this time.

## 2018-05-21 NOTE — Telephone Encounter (Signed)
  Reason for Disposition . Caller has medication question only, adult not sick, and triager answers question    Patient was seen yesterday- questions about Voltaren gel.  Answer Assessment - Initial Assessment Questions 1. SYMPTOMS: "Do you have any symptoms?"     Questions about how to use medication.  Protocols used: MEDICATION QUESTION CALL-A-AH

## 2018-05-24 ENCOUNTER — Encounter: Payer: Self-pay | Admitting: Family Medicine

## 2018-06-01 ENCOUNTER — Encounter: Payer: Self-pay | Admitting: Family Medicine

## 2018-06-01 ENCOUNTER — Ambulatory Visit (INDEPENDENT_AMBULATORY_CARE_PROVIDER_SITE_OTHER): Payer: Medicare Other | Admitting: Family Medicine

## 2018-06-01 ENCOUNTER — Other Ambulatory Visit (HOSPITAL_COMMUNITY)
Admission: RE | Admit: 2018-06-01 | Discharge: 2018-06-01 | Disposition: A | Discharge status: Home / Self Care | Payer: Medicare Other | Source: Ambulatory Visit | Attending: Family Medicine | Admitting: Family Medicine

## 2018-06-01 VITALS — BP 140/72 | HR 85 | Temp 97.9°F | Wt 174.2 lb

## 2018-06-01 DIAGNOSIS — S80811A Abrasion, right lower leg, initial encounter: Secondary | ICD-10-CM | POA: Diagnosis not present

## 2018-06-01 DIAGNOSIS — N390 Urinary tract infection, site not specified: Secondary | ICD-10-CM | POA: Diagnosis not present

## 2018-06-01 DIAGNOSIS — L089 Local infection of the skin and subcutaneous tissue, unspecified: Secondary | ICD-10-CM | POA: Diagnosis not present

## 2018-06-01 DIAGNOSIS — L03115 Cellulitis of right lower limb: Secondary | ICD-10-CM

## 2018-06-01 DIAGNOSIS — R300 Dysuria: Secondary | ICD-10-CM | POA: Insufficient documentation

## 2018-06-01 LAB — POCT URINALYSIS DIPSTICK
BILIRUBIN UA: NEGATIVE
Glucose, UA: NEGATIVE
Ketones, UA: NEGATIVE
LEUKOCYTES UA: NEGATIVE
Nitrite, UA: NEGATIVE
PH UA: 5.5 (ref 5.0–8.0)
Protein, UA: POSITIVE — AB
RBC UA: POSITIVE
SPEC GRAV UA: 1.015 (ref 1.010–1.025)
UROBILINOGEN UA: 0.2 U/dL

## 2018-06-01 MED ORDER — LEVOFLOXACIN 500 MG PO TABS: 500.0000 mg | ORAL_TABLET | Freq: Every day | ORAL | 0 refills | Status: DC

## 2018-06-01 NOTE — Progress Notes (Signed)
   Subjective:    Patient ID: Shelley Robinson, female    DOB: 1927/02/01, 84 y.o.   MRN: [id number]  HPI Here for 2 issues. First she has had 2 days of urinary urgency, frequency, and burning. No fever. Drinking water. Also about 10 days ago she tripped over her walker and fell, with the walker striking her right shin. The bleeding has stopped ands she has little pain, but the skin around it has turned red. She has a hx of cellulitis and she is concerned it could be coming back.    Review of Systems  Respiratory: Negative.   Cardiovascular: Negative.   Genitourinary: Positive for dysuria, frequency and urgency. Negative for flank pain and hematuria.  Skin: Positive for wound.       Objective:   Physical Exam  Constitutional: She is oriented to person, place, and time. She appears well-developed and well-nourished.  Neck: No thyromegaly present.  Cardiovascular: Normal rate, regular rhythm, normal heart sounds and intact distal pulses.  Pulmonary/Chest: Effort normal and breath sounds normal.  Abdominal: Soft. Bowel sounds are normal. She exhibits no distension and no mass. There is no tenderness. There is no rebound and no guarding.  Lymphadenopathy:    She has no cervical adenopathy.  Neurological: She is alert and oriented to person, place, and time.  Skin:  The right lower leg has an abrasion with a large clot of blood sitting on top, no active bleeding. No tenderness or warmth, but there is a zone if erythema around it           Assessment & Plan:  Treat the UTI with Levaquin. Culture the urine sample. She does also have an early cellulitis forming at the site of the abrasion, and the Levaquin should cover this as well. Recheck prn.  Alysia Penna, MD

## 2018-06-03 LAB — URINE CULTURE: Culture: NO GROWTH

## 2018-06-08 ENCOUNTER — Telehealth: Payer: Self-pay | Admitting: Internal Medicine

## 2018-06-08 NOTE — Telephone Encounter (Signed)
Copied from [place] [phone] [phone]. Topic: General - Other >> Jun 08, 2018 10:09 AM Lennox Solders wrote: Reason for CRM:pt daughter teresa rice is calling her mother saw dr fry on 06-01-18 for right leg cellulitis. Pt was given levaquin and daughter said the issues is better but not resolved. Last spring she had cellulitis and it took 3 wks of  levaquin medication to clear up. [employer] [address]

## 2018-06-08 NOTE — Telephone Encounter (Signed)
Dr. Fry please advise. Thanks  

## 2018-06-09 NOTE — Telephone Encounter (Signed)
Dr. Fry please advise. Thanks  

## 2018-06-09 NOTE — Telephone Encounter (Signed)
Pt's daughter, Helene Kelp, calling.  Helene Kelp is trying to coordinate getting the pt in if necessary.  At this time pt can't come in today at all.  Helene Kelp states that if pt needs to come in Thursday morning could be an option (as of right now). Helene Kelp is just trying to figure out if medication is what is needed or if pt needs to come in.

## 2018-06-10 MED ORDER — LEVOFLOXACIN 500 MG PO TABS: 500.0000 mg | ORAL_TABLET | Freq: Every day | ORAL | 0 refills | Status: DC

## 2018-06-10 NOTE — Telephone Encounter (Signed)
Called and spoke with pts daughter and she is aare of the levaquin that has been called in for another 2 weeks.

## 2018-06-10 NOTE — Telephone Encounter (Signed)
Call in 2 more weeks of Levaquin 500 mg daily, #14

## 2018-06-16 ENCOUNTER — Ambulatory Visit: Payer: Self-pay

## 2018-06-16 NOTE — Telephone Encounter (Signed)
Pt c/o right shin pain that is red , painful and has blisters. Pt stated that she is having redness below the shin toward the foot. Pt stated that there is warmth at the site. Pt is taking Levaquin for a UTI. Pt stated that the redness on the shin that is the size of a 50 cent piece. Pain is severe and pt is taking extra strength Tylenol. Pt denies fever, pus coming from wound, weakness. Pt was diagnosed with cellulitis 06/01/18. Pt given care advice and pt verbalized understanding. Pt asking to see Dr Sarajane Jews. Attempted to find an appointment for today but no openings. Pt given an appointment tomorrow morning with Dr Sarajane Jews. Pt advised if the pain or redness or the area worsens to go to [facility].   Reason for Disposition . [1] Red streak (or line) runs from area of infection AND [2] longer than 4 inches (10 cm)  Answer Assessment - Initial Assessment Questions 1. SYMPTOM: "What's the main symptom you're concerned about?" (e.g., redness, swelling, pain, fever, weakness)     Pain, redness, warmth at the site, fluid blisters 2. CELLULITIS LOCATION: "Where is the cellulitis  located?" (e.g., hand, arm, foot, leg, face)     Right shin goes to top of redness 3. CELLULITIS  SIZE: "What is the size of the red area?" (e.g., inches, centimeters; compare to size of a coin) .     Sore size of 50 cent piece 4. BETTER-SAME-WORSE: "Are you getting better, staying the same, or getting worse compared to the day you started the antibiotics?"  5.  PAIN: Do you have any pain?"  If so, "How bad is the pain?"  (e.g., Scale 1-10; mild, moderate, or severe)    - MILD (1-3): doesn't interfere with normal activities     - MODERATE (4-7): interferes with normal activities or awakens from sleep    - SEVERE (8-10): excruciating pain, unable to do any normal activities       Getting worse severe- aches 6.  FEVER: "Do you have a fever?" If so, ask: "What is it, how was it measured and when did it start?"     no 7. OTHER SYMPTOMS: "Do you  have any other symptoms?" (e.g., pus coming from a wound, red streaks, weakness) 8.  DIAGNOSIS DATE: "When was the cellulitis diagnosed?" "By whom?"  9.  ANTIBIOTIC NAME: "What antibiotic(s) are you taking?"  "How many times per day?" (Be sure the patient is receiving the antibiotic as directed).     Levaquin 10. ANTIBIOTIC DATE: "When was the antibiotic started?"       Is on Levaquin for UTI 11. FOLLOW-UP APPOINTMENT: "Do you have follow-up appointment with your doctor?"       no  Protocols used: CELLULITIS ON ANTIBIOTIC FOLLOW-UP CALL-A-AH

## 2018-06-17 ENCOUNTER — Telehealth: Payer: Self-pay | Admitting: Internal Medicine

## 2018-06-17 ENCOUNTER — Encounter: Payer: Self-pay | Admitting: Family Medicine

## 2018-06-17 ENCOUNTER — Ambulatory Visit (INDEPENDENT_AMBULATORY_CARE_PROVIDER_SITE_OTHER): Payer: Medicare Other | Admitting: Family Medicine

## 2018-06-17 VITALS — BP 142/84 | HR 90 | Temp 98.0°F | Wt 175.1 lb

## 2018-06-17 DIAGNOSIS — L03115 Cellulitis of right lower limb: Secondary | ICD-10-CM | POA: Diagnosis not present

## 2018-06-17 MED ORDER — CEPHALEXIN 500 MG PO CAPS: 500.0000 mg | ORAL_CAPSULE | Freq: Four times a day (QID) | ORAL | 0 refills | Status: DC

## 2018-06-17 NOTE — Telephone Encounter (Signed)
Forwarding to you since you saw patient today

## 2018-06-17 NOTE — Telephone Encounter (Signed)
Copied from [place] [phone]. Topic: Quick Communication - See Telephone Encounter >> Jun 17, 2018 12:41 PM Rutherford Nail, NT wrote: CRM for notification. See Telephone encounter for: 06/17/18. [facility] with [place] calling and states that the patient has an allergy to cephALEXin (KEFLEX) 500 MG capsule. Would like to know if there is another alternative to send? Please advise.  CB#: [phone]

## 2018-06-17 NOTE — Telephone Encounter (Signed)
Keflex has been added to pt allergy list. Please send alternative Rx.

## 2018-06-17 NOTE — Progress Notes (Signed)
   Subjective:    Patient ID: Shelley Robinson, female    DOB: 1927-04-04, 84 y.o.   MRN: [id number]  HPI Here to follow up cellulitis in the right lower leg. She fell on 05-14-18 and abraded the shin. She then developed redness and swelling and pain around the wound. We saw her for this and for UTI symptoms on 06-01-18. We chose to treat both these issues with Levaquin. Now after 2 weeks of Levaquin, the urinary symptoms have resolved but the leg still bothers her. No fever.    Review of Systems  Constitutional: Negative.   Respiratory: Negative.   Cardiovascular: Negative.   Genitourinary: Negative.   Skin: Positive for wound.       Objective:   Physical Exam  Constitutional: She appears well-developed and well-nourished.  Cardiovascular: Normal rate, regular rhythm, normal heart sounds and intact distal pulses.  Pulmonary/Chest: Effort normal and breath sounds normal.  Skin:  The right anterior lower leg has an abrasion with a scab on top and this is surrounded by erythema, warmth, and tenderness.           Assessment & Plan:  Cellulitis. We will stop Levaquin and begin 10 days of Keflex QID. Recheck prn. Alysia Penna, MD

## 2018-06-17 NOTE — Telephone Encounter (Signed)
Cancel the Keflex and call in Clindamycin 300 mg to take QID for 10 days #40

## 2018-06-18 MED ORDER — CLINDAMYCIN HCL 300 MG PO CAPS: 300.0000 mg | ORAL_CAPSULE | Freq: Four times a day (QID) | ORAL | 0 refills | Status: AC

## 2018-06-18 NOTE — Telephone Encounter (Signed)
meds changed  

## 2018-06-22 ENCOUNTER — Encounter: Payer: Medicare Other | Admitting: Family Medicine

## 2018-06-28 ENCOUNTER — Emergency Department (HOSPITAL_COMMUNITY)
Admission: EM | Admit: 2018-06-28 | Discharge: 2018-06-28 | Disposition: A | Discharge status: Home / Self Care | Payer: Medicare Other | Attending: Emergency Medicine | Admitting: Emergency Medicine

## 2018-06-28 ENCOUNTER — Emergency Department (HOSPITAL_BASED_OUTPATIENT_CLINIC_OR_DEPARTMENT_OTHER)
Admit: 2018-06-28 | Discharge: 2018-06-28 | Disposition: A | Discharge status: Home / Self Care | Payer: Medicare Other | Attending: Emergency Medicine | Admitting: Emergency Medicine

## 2018-06-28 ENCOUNTER — Encounter (HOSPITAL_COMMUNITY): Payer: Self-pay | Admitting: Emergency Medicine

## 2018-06-28 DIAGNOSIS — Y929 Unspecified place or not applicable: Secondary | ICD-10-CM | POA: Diagnosis not present

## 2018-06-28 DIAGNOSIS — W228XXA Striking against or struck by other objects, initial encounter: Secondary | ICD-10-CM | POA: Insufficient documentation

## 2018-06-28 DIAGNOSIS — Z79899 Other long term (current) drug therapy: Secondary | ICD-10-CM | POA: Diagnosis not present

## 2018-06-28 DIAGNOSIS — Y939 Activity, unspecified: Secondary | ICD-10-CM | POA: Insufficient documentation

## 2018-06-28 DIAGNOSIS — I10 Essential (primary) hypertension: Secondary | ICD-10-CM | POA: Diagnosis not present

## 2018-06-28 DIAGNOSIS — Y999 Unspecified external cause status: Secondary | ICD-10-CM | POA: Insufficient documentation

## 2018-06-28 DIAGNOSIS — R52 Pain, unspecified: Secondary | ICD-10-CM

## 2018-06-28 DIAGNOSIS — I82411 Acute embolism and thrombosis of right femoral vein: Secondary | ICD-10-CM | POA: Diagnosis not present

## 2018-06-28 DIAGNOSIS — L03115 Cellulitis of right lower limb: Secondary | ICD-10-CM | POA: Diagnosis not present

## 2018-06-28 DIAGNOSIS — S81801A Unspecified open wound, right lower leg, initial encounter: Secondary | ICD-10-CM | POA: Diagnosis not present

## 2018-06-28 DIAGNOSIS — Z87891 Personal history of nicotine dependence: Secondary | ICD-10-CM | POA: Diagnosis not present

## 2018-06-28 DIAGNOSIS — M79604 Pain in right leg: Secondary | ICD-10-CM | POA: Diagnosis present

## 2018-06-28 LAB — CBC WITH DIFFERENTIAL/PLATELET
ABS IMMATURE GRANULOCYTES: 0.02 10*3/uL (ref 0.00–0.07)
BASOS PCT: 0 %
Basophils Absolute: 0 10*3/uL (ref 0.0–0.1)
EOS PCT: 2 %
Eosinophils Absolute: 0.1 10*3/uL (ref 0.0–0.5)
HCT: 45.2 % (ref 36.0–46.0)
Hemoglobin: 14 g/dL (ref 12.0–15.0)
Immature Granulocytes: 0 %
Lymphocytes Relative: 20 %
Lymphs Abs: 1 10*3/uL (ref 0.7–4.0)
MCH: 32.4 pg (ref 26.0–34.0)
MCHC: 31 g/dL (ref 30.0–36.0)
MCV: 104.6 fL — ABNORMAL HIGH (ref 80.0–100.0)
Monocytes Absolute: 0.5 10*3/uL (ref 0.1–1.0)
Monocytes Relative: 10 %
NEUTROS ABS: 3.4 10*3/uL (ref 1.7–7.7)
Neutrophils Relative %: 68 %
PLATELETS: 185 10*3/uL (ref 150–400)
RBC: 4.32 MIL/uL (ref 3.87–5.11)
RDW: 12.2 % (ref 11.5–15.5)
WBC: 5 10*3/uL (ref 4.0–10.5)
nRBC: 0 % (ref 0.0–0.2)

## 2018-06-28 LAB — BASIC METABOLIC PANEL
ANION GAP: 10 (ref 5–15)
BUN: 26 mg/dL — ABNORMAL HIGH (ref 8–23)
CHLORIDE: 106 mmol/L (ref 98–111)
CO2: 27 mmol/L (ref 22–32)
Calcium: 9 mg/dL (ref 8.9–10.3)
Creatinine, Ser: 1.25 mg/dL — ABNORMAL HIGH (ref 0.44–1.00)
GFR, EST AFRICAN AMERICAN: 44 mL/min — AB (ref >60)
GFR, EST NON AFRICAN AMERICAN: 38 mL/min — AB (ref >60)
Glucose, Bld: 114 mg/dL — ABNORMAL HIGH (ref 70–99)
Potassium: 4.2 mmol/L (ref 3.5–5.1)
Sodium: 143 mmol/L (ref 135–145)

## 2018-06-28 MED ORDER — SODIUM CHLORIDE 0.9 % IV SOLN
1.0000 g | Freq: Once | INTRAVENOUS | Status: AC
  Administered 2018-06-28: 1 g via INTRAVENOUS
  Filled 2018-06-28: qty 10

## 2018-06-28 MED ORDER — VANCOMYCIN HCL IN DEXTROSE 1-5 GM/200ML-% IV SOLN
1000.0000 mg | Freq: Once | INTRAVENOUS | Status: AC
  Administered 2018-06-28: 1000 mg via INTRAVENOUS
  Filled 2018-06-28: qty 200

## 2018-06-28 NOTE — ED Triage Notes (Signed)
Pt fell Nov 5 and bruised right lower leg that turned in to cellulitis. Pt finished antibiotics for cellulitis. Pt was seen at [facility] today and when provider pressed on RLE pt had lots of pain. Pt reports that pain is worse with palpation.

## 2018-06-28 NOTE — Discharge Instructions (Addendum)
Take the clindamycin antibiotics you already have.  You will also need to take probiotics and eat yogurt to prevent diarrhea.

## 2018-06-28 NOTE — ED Provider Notes (Signed)
[facility] DEPT Provider Note   CSN: [id number] Arrival date & time: 06/28/18  1439     History   Chief Complaint Chief Complaint  Patient presents with  . Leg Swelling  . Leg Pain    HPI Shelley Robinson is a 84 y.o. female.  Pt fell in November and sustained a skin tear.  She has been on Levaquin and Keflex for cellulitis which has not gotten better.  She went to urgent care today and was told to come here to r/o DVT.  The pt denies f/c.      Past Medical History:  Diagnosis Date  . Anxiety   . Arthritis   . GERD (gastroesophageal reflux disease)   . Hyperlipidemia   . Hypertension   . Osteopenia     Patient Active Problem List   Diagnosis Date Noted  . H/O diastolic dysfunction 56/31/4970  . Osteoarthritis 01/16/2009  . BACK PAIN, THORACIC REGION, RIGHT 02/23/2008  . DYSPNEA ON EXERTION 10/20/2007  . ABDOMEN, ACUTE 03/16/2007  . Dyslipidemia 01/06/2007  . Essential hypertension 01/06/2007  . LOW BACK PAIN 01/06/2007    Past Surgical History:  Procedure Laterality Date  . ABDOMINAL HYSTERECTOMY    . CHOLECYSTECTOMY    . JOINT REPLACEMENT    . TONSILLECTOMY       OB History    Gravida  3   Para  3   Term  3   Preterm      AB      Living  3     SAB      TAB      Ectopic      Multiple      Live Births  3            Home Medications    Prior to Admission medications   Medication Sig Start Date End Date Taking? Authorizing Provider  acetaminophen (TYLENOL) 500 MG tablet Take 1 tablet (500 mg total) by mouth every 6 (six) hours as needed for pain. Take one  in AM and one at qhs, and one at noon if needed Patient taking differently: Take 500 mg by mouth every 8 (eight) hours as needed for moderate pain. Take one  in AM and one at qhs, and one at noon if needed 11/03/11  Yes Dhungel, Nishant, MD  diclofenac sodium (VOLTAREN) 1 % GEL Apply 4 g topically 4 (four) times daily. Patient taking  differently: Apply 4 g topically 4 (four) times daily as needed (pain).  05/20/18  Yes Martinique, Betty G, MD  loratadine (CLARITIN) 10 MG tablet Take 10 mg by mouth daily. Not sure of the mg/lc   Yes [provider]  losartan-hydrochlorothiazide (HYZAAR) 100-12.5 MG tablet Take 1 tablet by mouth daily. 03/23/18  Yes Marletta Lor, MD  Multiple Vitamins-Minerals (MULTIVITAMIN ADULT) CHEW Chew 1 tablet by mouth daily.   Yes [provider]  Multiple Vitamins-Minerals (PRESERVISION AREDS 2 PO) Take 2 tablets by mouth daily.    Yes [provider]  pantoprazole (PROTONIX) 40 MG tablet Take 1 tablet (40 mg total) by mouth daily. 03/23/18  Yes Marletta Lor, MD  rosuvastatin (CRESTOR) 10 MG tablet Take 1 tablet (10 mg total) by mouth daily. 03/23/18  Yes Marletta Lor, MD  traZODone (DESYREL) 50 MG tablet 1 tablet at bedtime as needed for sleep Patient taking differently: Take 50 mg by mouth at bedtime.  03/23/18  Yes Marletta Lor, MD  clindamycin (CLEOCIN) 300  MG capsule Take 1 capsule (300 mg total) by mouth 4 (four) times daily for 10 days. Patient not taking: Reported on 06/28/2018 06/18/18 06/28/18  Laurey Morale, MD  conjugated estrogens (PREMARIN) vaginal cream Place 1 Applicatorful vaginally as needed. Patient taking differently: Place 1 Applicatorful vaginally daily as needed (dryness).  05/21/16   Marletta Lor, MD  [facility] injection LOT                     RA/LA                   EXP                    [facility] 05/30/17   [provider]    Family History Family History  Problem Relation Age of Onset  . Alzheimer's disease Mother   . Heart disease Father   . Cancer Sister   . Lymphoma Sister     Social History Social History   Tobacco Use  . Smoking status: Former Smoker    Last attempt to quit: 07/08/1978    Years since quitting: 40.0  . Smokeless tobacco: Never Used  Substance Use Topics  . Alcohol use: Yes     Alcohol/week: 1.0 standard drinks    Types: 1 Glasses of wine per week    Comment: once or twice a month  . Drug use: No     Allergies   Codeine; Metaxalone; Naproxen; Lisinopril; Piroxicam; Prednisone; and Penicillins   Review of Systems Review of Systems  Skin:       cellulitis  All other systems reviewed and are negative.    Physical Exam Updated Vital Signs BP (!) 186/82 (BP Location: Right Arm)   Pulse 66   Temp (!) 97.4 F (36.3 C) (Oral)   Resp 18   SpO2 96%   Physical Exam Vitals signs and nursing note reviewed.  Constitutional:      Appearance: Normal appearance. She is normal weight.  HENT:     Head: Normocephalic and atraumatic.     Right Ear: External ear normal.     Left Ear: External ear normal.     Nose: Nose normal.     Mouth/Throat:     Mouth: Mucous membranes are moist.     Pharynx: Oropharynx is clear.  Eyes:     Pupils: Pupils are equal, round, and reactive to light.  Neck:     Musculoskeletal: Normal range of motion and neck supple.  Cardiovascular:     Rate and Rhythm: Normal rate and regular rhythm.     Pulses: Normal pulses.     Heart sounds: Normal heart sounds.  Pulmonary:     Effort: Pulmonary effort is normal.     Breath sounds: Normal breath sounds.  Abdominal:     General: Abdomen is flat. Bowel sounds are normal.     Palpations: Abdomen is soft.  Musculoskeletal:        General: Swelling present.     Comments: RLE skin tear with surrounding cellulitis  Skin:    Capillary Refill: Capillary refill takes less than 2 seconds.     Comments: See picture  Neurological:     General: No focal deficit present.     Mental Status: She is alert and oriented to person, place, and time.  Psychiatric:        Mood and Affect: Mood normal.        Behavior: Behavior normal.  Thought Content: Thought content normal.        Judgment: Judgment normal.        ED Treatments / Results  Labs (all labs ordered are listed, but only  abnormal results are displayed) Labs Reviewed  BASIC METABOLIC PANEL - Abnormal; Notable for the following components:      Result Value   Glucose, Bld 114 (*)    BUN 26 (*)    Creatinine, Ser 1.25 (*)    GFR calc non Af Amer 38 (*)    GFR calc Af Amer 44 (*)    All other components within normal limits  CBC WITH DIFFERENTIAL/PLATELET - Abnormal; Notable for the following components:   MCV 104.6 (*)    All other components within normal limits    EKG None  Radiology Vas [place] Lower Extremity Venous (dvt) 7a-7p  Result Date: 06/28/2018  Lower Venous Study Indications: Pain.  Performing Technologist: Oliver Hum RVT  Examination Guidelines: A complete evaluation includes B-mode imaging, spectral Doppler, color Doppler, and power Doppler as needed of all accessible portions of each vessel. Bilateral testing is considered an integral part of a complete examination. Limited examinations for reoccurring indications may be performed as noted.  Right Venous Findings: +---------+---------------+---------+-----------+----------+-------+          CompressibilityPhasicitySpontaneityPropertiesSummary +---------+---------------+---------+-----------+----------+-------+ CFV      Full           Yes      Yes                          +---------+---------------+---------+-----------+----------+-------+ SFJ      Full                                                 +---------+---------------+---------+-----------+----------+-------+ FV Prox  Full                                                 +---------+---------------+---------+-----------+----------+-------+ FV Mid   Full                                                 +---------+---------------+---------+-----------+----------+-------+ FV DistalFull                                                 +---------+---------------+---------+-----------+----------+-------+ PFV      Full                                                  +---------+---------------+---------+-----------+----------+-------+ POP      Full           Yes      Yes                          +---------+---------------+---------+-----------+----------+-------+ PTV  Full                                                 +---------+---------------+---------+-----------+----------+-------+ PERO     Full                                                 +---------+---------------+---------+-----------+----------+-------+  Left Venous Findings: +---+---------------+---------+-----------+----------+-------+    CompressibilityPhasicitySpontaneityPropertiesSummary +---+---------------+---------+-----------+----------+-------+ CFVFull           Yes      Yes                          +---+---------------+---------+-----------+----------+-------+    Summary: Right: There is no evidence of deep vein thrombosis in the lower extremity. No cystic structure found in the popliteal fossa. Left: No evidence of common femoral vein obstruction.  *See table(s) above for measurements and observations.    Preliminary     Procedures Procedures (including critical care time)  Medications Ordered in ED Medications  vancomycin (VANCOCIN) IVPB 1000 mg/200 mL premix (1,000 mg Intravenous New Bag/Given 06/28/18 1743)  cefTRIAXone (ROCEPHIN) 1 g in sodium chloride 0.9 % 100 mL IVPB ( Intravenous Paused 06/28/18 1728)     Initial Impression / Assessment and Plan / ED Course  I have reviewed the triage vital signs and the nursing notes.  Pertinent labs & imaging results that were available during my care of the patient were reviewed by me and considered in my medical decision making (see chart for details).   Pt will be given a dose of vancomycin and rocephin in ED.  She will be referred to the wound care clinic.  She looks good and is stable for d/c.  Pt already has a rx for clindamycin which her doctor initially ordered, but then changed.   She will be d/c home on that.  Pt's daughter is a Software engineer and assures me she has a full week's course.    Final Clinical Impressions(s) / ED Diagnoses   Final diagnoses:  Cellulitis of right lower extremity  Leg wound, right, initial encounter    ED Discharge Orders         Ordered    AMB referral to wound care center     06/28/18 1719           Isla Pence, MD 06/28/18 1800

## 2018-06-28 NOTE — Progress Notes (Signed)
Right lower extremity venous duplex has been completed. Negative for DVT. Results were given to Dr. Gilford Raid.  06/28/18 4:37 PM Shelley Robinson RVT

## 2018-06-28 NOTE — ED Notes (Signed)
Pt signed signature pad but it is not working. Daughter witnessed.

## 2018-07-06 DIAGNOSIS — H353232 Exudative age-related macular degeneration, bilateral, with inactive choroidal neovascularization: Secondary | ICD-10-CM | POA: Diagnosis not present

## 2018-07-09 ENCOUNTER — Ambulatory Visit: Payer: Medicare Other | Admitting: Family Medicine

## 2018-07-10 ENCOUNTER — Encounter (HOSPITAL_BASED_OUTPATIENT_CLINIC_OR_DEPARTMENT_OTHER): Payer: Medicare Other | Attending: Internal Medicine

## 2018-07-10 DIAGNOSIS — I10 Essential (primary) hypertension: Secondary | ICD-10-CM | POA: Insufficient documentation

## 2018-07-10 DIAGNOSIS — I87311 Chronic venous hypertension (idiopathic) with ulcer of right lower extremity: Secondary | ICD-10-CM | POA: Diagnosis not present

## 2018-07-10 DIAGNOSIS — I87331 Chronic venous hypertension (idiopathic) with ulcer and inflammation of right lower extremity: Secondary | ICD-10-CM | POA: Diagnosis not present

## 2018-07-10 DIAGNOSIS — I890 Lymphedema, not elsewhere classified: Secondary | ICD-10-CM | POA: Diagnosis not present

## 2018-07-10 DIAGNOSIS — L97812 Non-pressure chronic ulcer of other part of right lower leg with fat layer exposed: Secondary | ICD-10-CM | POA: Diagnosis not present

## 2018-07-14 DIAGNOSIS — L97812 Non-pressure chronic ulcer of other part of right lower leg with fat layer exposed: Secondary | ICD-10-CM | POA: Diagnosis not present

## 2018-07-14 DIAGNOSIS — I890 Lymphedema, not elsewhere classified: Secondary | ICD-10-CM | POA: Diagnosis not present

## 2018-07-14 DIAGNOSIS — I872 Venous insufficiency (chronic) (peripheral): Secondary | ICD-10-CM | POA: Diagnosis not present

## 2018-07-14 DIAGNOSIS — Z87891 Personal history of nicotine dependence: Secondary | ICD-10-CM | POA: Diagnosis not present

## 2018-07-14 DIAGNOSIS — M150 Primary generalized (osteo)arthritis: Secondary | ICD-10-CM | POA: Diagnosis not present

## 2018-07-14 DIAGNOSIS — I10 Essential (primary) hypertension: Secondary | ICD-10-CM | POA: Diagnosis not present

## 2018-07-15 ENCOUNTER — Encounter: Payer: Self-pay | Admitting: Family Medicine

## 2018-07-15 ENCOUNTER — Ambulatory Visit (INDEPENDENT_AMBULATORY_CARE_PROVIDER_SITE_OTHER): Payer: Medicare Other | Admitting: Family Medicine

## 2018-07-15 VITALS — BP 170/100 | HR 98 | Temp 97.6°F | Ht 60.0 in | Wt 173.3 lb

## 2018-07-15 DIAGNOSIS — R300 Dysuria: Secondary | ICD-10-CM | POA: Diagnosis not present

## 2018-07-15 DIAGNOSIS — R0789 Other chest pain: Secondary | ICD-10-CM

## 2018-07-15 DIAGNOSIS — I10 Essential (primary) hypertension: Secondary | ICD-10-CM | POA: Diagnosis not present

## 2018-07-15 DIAGNOSIS — M150 Primary generalized (osteo)arthritis: Secondary | ICD-10-CM | POA: Diagnosis not present

## 2018-07-15 DIAGNOSIS — M159 Polyosteoarthritis, unspecified: Secondary | ICD-10-CM

## 2018-07-15 LAB — COMPREHENSIVE METABOLIC PANEL
ALBUMIN: 3.9 g/dL (ref 3.5–5.2)
ALT: 16 U/L (ref 0–35)
AST: 21 U/L (ref 0–37)
Alkaline Phosphatase: 116 U/L (ref 39–117)
BILIRUBIN TOTAL: 0.7 mg/dL (ref 0.2–1.2)
BUN: 24 mg/dL — AB (ref 6–23)
CO2: 30 mEq/L (ref 19–32)
Calcium: 9.6 mg/dL (ref 8.4–10.5)
Chloride: 104 mEq/L (ref 96–112)
Creatinine, Ser: 1.17 mg/dL (ref 0.40–1.20)
GFR: 46.23 mL/min — ABNORMAL LOW (ref >60.00)
Glucose, Bld: 93 mg/dL (ref 70–99)
POTASSIUM: 5 meq/L (ref 3.5–5.1)
SODIUM: 141 meq/L (ref 135–145)
Total Protein: 6.5 g/dL (ref 6.0–8.3)

## 2018-07-15 LAB — CBC WITH DIFFERENTIAL/PLATELET
BASOS PCT: 0.6 % (ref 0.0–3.0)
Basophils Absolute: 0 10*3/uL (ref 0.0–0.1)
Eosinophils Absolute: 0.1 10*3/uL (ref 0.0–0.7)
Eosinophils Relative: 1.4 % (ref 0.0–5.0)
HEMATOCRIT: 42 % (ref 36.0–46.0)
Hemoglobin: 14.2 g/dL (ref 12.0–15.0)
LYMPHS PCT: 18.9 % (ref 12.0–46.0)
Lymphs Abs: 1 10*3/uL (ref 0.7–4.0)
MCHC: 33.9 g/dL (ref 30.0–36.0)
MCV: 98.4 fl (ref 78.0–100.0)
MONOS PCT: 8.6 % (ref 3.0–12.0)
Monocytes Absolute: 0.4 10*3/uL (ref 0.1–1.0)
NEUTROS ABS: 3.6 10*3/uL (ref 1.4–7.7)
Neutrophils Relative %: 70.5 % (ref 43.0–77.0)
PLATELETS: 226 10*3/uL (ref 150.0–400.0)
RBC: 4.27 Mil/uL (ref 3.87–5.11)
RDW: 13.5 % (ref 11.5–15.5)
WBC: 5.1 10*3/uL (ref 4.0–10.5)

## 2018-07-15 LAB — URINALYSIS, ROUTINE W REFLEX MICROSCOPIC
Bilirubin Urine: NEGATIVE
Ketones, ur: NEGATIVE
Leukocytes, UA: NEGATIVE
Nitrite: NEGATIVE
SPECIFIC GRAVITY, URINE: 1.02 (ref 1.000–1.030)
Total Protein, Urine: 100 — AB
URINE GLUCOSE: NEGATIVE
Urobilinogen, UA: 0.2 (ref 0.0–1.0)
pH: 6 (ref 5.0–8.0)

## 2018-07-15 NOTE — Progress Notes (Signed)
Shelley Robinson DOB: February 15, 1927 Encounter date: 07/15/2018  This isa 85 y.o. female who presents to establish care. Chief Complaint  Patient presents with  . Transitions Of Care    History of present illness: Went to wound center last Friday. They are treating her leg. She fell with walker (was using 3 wheeler) and it folded and she fell. Just bruised leg; skin wasn't even broken, but then it just progressed, blistered and continued to be a sore. Follows at wound clinic weekly and nurse comes out weekly to change dressing. Very sore. Not worse now than it was last week. Tries to elevate when sitting but unable to get over heart level.  Had something that happened last week. 2 nights straight sitting in lounge chair and got tightness in chest. Couldn't wear bra because it was hurting/aggravating her. Less than 5 minutes. Not sure if relation to eating, could have been as she admits to overeating and continued snacking. Not short of breath, not sweaty and no other associated symptoms. No episodes since that time. Nothing similar in past. States that EKG shows mild older MI. Does take acid reflux medications; tries to avoid certain foods. Notes reflux when she lays down at night if she has not been careful with diet.  Exercise ability has decreased in last 2-3 months. Knee pain continues to worsen. Uses topical salve for knees. Had cortisone shots in knees which used to help. Would like to get these more often. Feels that health is declining in last couple of years. Since she has been less active feels like she is getting weaker, deconditioned. Daughter is pharmacist and helps advise her regarding medications.    Past Medical History:  Diagnosis Date  . Anxiety   . Arthritis   . GERD (gastroesophageal reflux disease)   . Hyperlipidemia   . Hypertension   . Osteopenia    Past Surgical History:  Procedure Laterality Date  . ABDOMINAL HYSTERECTOMY     fibroid tumors  . CHOLECYSTECTOMY    .  JOINT REPLACEMENT     left shoulder  . TONSILLECTOMY     Allergies  Allergen Reactions  . Codeine Other (See Comments)  . Metaxalone Other (See Comments)  . Naproxen Other (See Comments)  . Lisinopril Cough  . Piroxicam Other (See Comments)    unknown  . Prednisone Other (See Comments)    hypertensive  . Penicillins Rash    DID THE REACTION INVOLVE: Swelling of the face/tongue/throat, SOB, or low BP? Skin Rash Sudden or severe rash/hives, skin peeling, or the inside of the mouth or nose? Y Did it require medical treatment? N When did it last happen?Over 56 Years Ago. If all above answers are "NO", may proceed with cephalosporin use.    No outpatient medications have been marked as taking for the 07/15/18 encounter (Office Visit) with Caren Macadam, MD.   Social History   Tobacco Use  . Smoking status: Former Smoker    Last attempt to quit: 07/08/1978    Years since quitting: 40.0  . Smokeless tobacco: Never Used  Substance Use Topics  . Alcohol use: Yes    Alcohol/week: 1.0 standard drinks    Types: 1 Glasses of wine per week    Comment: once or twice a month   Family History  Problem Relation Age of Onset  . Alzheimer's disease Mother   . Heart disease Father   . Cancer Sister   . Lymphoma Sister      Review of Systems  Constitutional: Negative for chills, fatigue and fever.  Respiratory: Negative for cough, chest tightness (see hpi), shortness of breath and wheezing.   Cardiovascular: Positive for leg swelling (bilat; stable). Negative for chest pain and palpitations.  Gastrointestinal: Negative for abdominal distention, abdominal pain, nausea and vomiting.    Objective:  BP (!) 170/100 (BP Location: Right Arm, Patient Position: Sitting, Cuff Size: Normal)   Pulse 98   Temp 97.6 F (36.4 C) (Oral)   Ht 5' (1.524 m)   Wt 173 lb 4.8 oz (78.6 kg)   SpO2 97%   BMI 33.85 kg/m   Weight: 173 lb 4.8 oz (78.6 kg)   BP Readings from Last 3 Encounters:   07/15/18 (!) 170/100  06/28/18 (!) 158/91  06/17/18 (!) 142/84   Wt Readings from Last 3 Encounters:  07/15/18 173 lb 4.8 oz (78.6 kg)  06/17/18 175 lb 2 oz (79.4 kg)  06/01/18 174 lb 4 oz (79 kg)    Physical Exam Constitutional:      General: She is not in acute distress.    Appearance: She is well-developed.  Cardiovascular:     Rate and Rhythm: Normal rate and regular rhythm.     Heart sounds: Murmur present. Systolic murmur present with a grade of 2/6. No friction rub.     Comments: No lower extremity edema Pulmonary:     Effort: Pulmonary effort is normal. No respiratory distress.     Breath sounds: Normal breath sounds. No wheezing or rales.  Neurological:     Mental Status: She is alert and oriented to person, place, and time.  Psychiatric:        Behavior: Behavior normal.     Assessment/Plan: 1. Chest pressure EKG quality is poor since patient unable to get up on table. There is new incomplete RBBB noted when compared to previous in 2015, but I do not observe acute changes. She is asymptomatic now and has been for last week. I encouraged her to let us know if any recurrence of symptoms. - EKG 12-Lead  2. Dysuria This concern brought up at end of visit. There is some discomfort with urination and there is frequency with incontinence. She has this with cough/sneeze but also some overflow/urge. Will make sure no underlying infection. Further recommendations pending results. - Culture, Urine - Urinalysis - Comprehensive metabolic panel; Future - CBC with Differential/Platelet; Future - Comprehensive metabolic panel - CBC with Differential/Platelet  3. Essential hypertension continue current medicaitons. *I misread blood pressure today at visit and thought she was within normal range. Will touch base with patient to get home readings and follow up as appropriate.  4. Primary osteoarthritis involving multiple joints Following with ortho.  Return pending lab  results.  Micheline Rough, MD

## 2018-07-16 LAB — URINE CULTURE
MICRO NUMBER:: 28207
SPECIMEN QUALITY:: ADEQUATE

## 2018-07-17 ENCOUNTER — Telehealth: Payer: Self-pay

## 2018-07-17 DIAGNOSIS — I10 Essential (primary) hypertension: Secondary | ICD-10-CM | POA: Diagnosis not present

## 2018-07-17 DIAGNOSIS — I87331 Chronic venous hypertension (idiopathic) with ulcer and inflammation of right lower extremity: Secondary | ICD-10-CM | POA: Diagnosis not present

## 2018-07-17 DIAGNOSIS — I87311 Chronic venous hypertension (idiopathic) with ulcer of right lower extremity: Secondary | ICD-10-CM | POA: Diagnosis not present

## 2018-07-17 DIAGNOSIS — L97812 Non-pressure chronic ulcer of other part of right lower leg with fat layer exposed: Secondary | ICD-10-CM | POA: Diagnosis not present

## 2018-07-17 NOTE — Telephone Encounter (Signed)
message from Dr. Ethlyn Gallery has been copied into result note from 07/15/18. See result note for further documentation.

## 2018-07-17 NOTE — Telephone Encounter (Signed)
-----   Message from Caren Macadam, MD sent at 07/15/2018 11:53 PM EST ----- I misread bp on sheet and thought she was wnl, but she was pretty elevated. Please see if she has cuff at home she can check with and let us know how bp running there? I would like to know number now, but then I would also like daily check with readings reported by next week. Make sure taking losartan-hctz as precribed.

## 2018-07-21 DIAGNOSIS — I890 Lymphedema, not elsewhere classified: Secondary | ICD-10-CM | POA: Diagnosis not present

## 2018-07-21 DIAGNOSIS — L97812 Non-pressure chronic ulcer of other part of right lower leg with fat layer exposed: Secondary | ICD-10-CM | POA: Diagnosis not present

## 2018-07-21 DIAGNOSIS — Z87891 Personal history of nicotine dependence: Secondary | ICD-10-CM | POA: Diagnosis not present

## 2018-07-21 DIAGNOSIS — I10 Essential (primary) hypertension: Secondary | ICD-10-CM | POA: Diagnosis not present

## 2018-07-21 DIAGNOSIS — M150 Primary generalized (osteo)arthritis: Secondary | ICD-10-CM | POA: Diagnosis not present

## 2018-07-21 DIAGNOSIS — I872 Venous insufficiency (chronic) (peripheral): Secondary | ICD-10-CM | POA: Diagnosis not present

## 2018-07-24 DIAGNOSIS — I87331 Chronic venous hypertension (idiopathic) with ulcer and inflammation of right lower extremity: Secondary | ICD-10-CM | POA: Diagnosis not present

## 2018-07-24 DIAGNOSIS — L97812 Non-pressure chronic ulcer of other part of right lower leg with fat layer exposed: Secondary | ICD-10-CM | POA: Diagnosis not present

## 2018-07-24 DIAGNOSIS — I872 Venous insufficiency (chronic) (peripheral): Secondary | ICD-10-CM | POA: Diagnosis not present

## 2018-07-24 DIAGNOSIS — I890 Lymphedema, not elsewhere classified: Secondary | ICD-10-CM | POA: Diagnosis not present

## 2018-07-24 DIAGNOSIS — I10 Essential (primary) hypertension: Secondary | ICD-10-CM | POA: Diagnosis not present

## 2018-07-24 DIAGNOSIS — L03115 Cellulitis of right lower limb: Secondary | ICD-10-CM | POA: Diagnosis not present

## 2018-07-24 DIAGNOSIS — S81811A Laceration without foreign body, right lower leg, initial encounter: Secondary | ICD-10-CM | POA: Diagnosis not present

## 2018-07-28 DIAGNOSIS — I872 Venous insufficiency (chronic) (peripheral): Secondary | ICD-10-CM | POA: Diagnosis not present

## 2018-07-28 DIAGNOSIS — L97812 Non-pressure chronic ulcer of other part of right lower leg with fat layer exposed: Secondary | ICD-10-CM | POA: Diagnosis not present

## 2018-07-28 DIAGNOSIS — M150 Primary generalized (osteo)arthritis: Secondary | ICD-10-CM | POA: Diagnosis not present

## 2018-07-28 DIAGNOSIS — I10 Essential (primary) hypertension: Secondary | ICD-10-CM | POA: Diagnosis not present

## 2018-07-28 DIAGNOSIS — Z87891 Personal history of nicotine dependence: Secondary | ICD-10-CM | POA: Diagnosis not present

## 2018-07-28 DIAGNOSIS — I890 Lymphedema, not elsewhere classified: Secondary | ICD-10-CM | POA: Diagnosis not present

## 2018-07-31 ENCOUNTER — Telehealth: Payer: Self-pay | Admitting: Family Medicine

## 2018-07-31 DIAGNOSIS — Z87891 Personal history of nicotine dependence: Secondary | ICD-10-CM | POA: Diagnosis not present

## 2018-07-31 DIAGNOSIS — I872 Venous insufficiency (chronic) (peripheral): Secondary | ICD-10-CM | POA: Diagnosis not present

## 2018-07-31 DIAGNOSIS — I10 Essential (primary) hypertension: Secondary | ICD-10-CM | POA: Diagnosis not present

## 2018-07-31 DIAGNOSIS — L97812 Non-pressure chronic ulcer of other part of right lower leg with fat layer exposed: Secondary | ICD-10-CM | POA: Diagnosis not present

## 2018-07-31 DIAGNOSIS — M150 Primary generalized (osteo)arthritis: Secondary | ICD-10-CM | POA: Diagnosis not present

## 2018-07-31 DIAGNOSIS — I890 Lymphedema, not elsewhere classified: Secondary | ICD-10-CM | POA: Diagnosis not present

## 2018-07-31 NOTE — Telephone Encounter (Signed)
Pt given lab results per notes of Dr. Ethlyn Gallery on 07/17/18. Pt verbalized understanding. She says that she is not having any urinary symptoms at this time. She says that she takes her BP medication as prescribed. She says she has an old BP cuff that she doesn't think works, so she will have her daughter to send her a new one. She says that she has a nurse to come in twice a week to look at her leg and she checks her BP. She asks when does she need to come back in to see Dr. Ethlyn Gallery, I advised there is no note about a follow up visit and I will send this to Dr. Ethlyn Gallery and someone will call with her recommended return visit. She says if she doesn't answer the phone, to leave her a voicemail with when she will need to come in.

## 2018-07-31 NOTE — Telephone Encounter (Signed)
Copied from [place] [id number]. Topic: Quick Communication - Lab Results (Clinic Use ONLY) >> Jul 17, 2018 10:13 AM Rebecca Eaton, CMA wrote: Called patient to inform them of 07/15/18 lab results. When patient returns call, triage nurse may disclose results.

## 2018-07-31 NOTE — Telephone Encounter (Signed)
noted 

## 2018-07-31 NOTE — Telephone Encounter (Signed)
Will send to Dr. Ethlyn Gallery as Juluis Rainier

## 2018-08-03 DIAGNOSIS — I890 Lymphedema, not elsewhere classified: Secondary | ICD-10-CM | POA: Diagnosis not present

## 2018-08-03 DIAGNOSIS — I872 Venous insufficiency (chronic) (peripheral): Secondary | ICD-10-CM | POA: Diagnosis not present

## 2018-08-03 DIAGNOSIS — M150 Primary generalized (osteo)arthritis: Secondary | ICD-10-CM | POA: Diagnosis not present

## 2018-08-03 DIAGNOSIS — I10 Essential (primary) hypertension: Secondary | ICD-10-CM | POA: Diagnosis not present

## 2018-08-03 DIAGNOSIS — Z87891 Personal history of nicotine dependence: Secondary | ICD-10-CM | POA: Diagnosis not present

## 2018-08-03 DIAGNOSIS — L97812 Non-pressure chronic ulcer of other part of right lower leg with fat layer exposed: Secondary | ICD-10-CM | POA: Diagnosis not present

## 2018-08-03 NOTE — Telephone Encounter (Signed)
ATC. Received the message "your call can not be completed at this time." CRM created.

## 2018-08-03 NOTE — Telephone Encounter (Signed)
Let's have her return in a month for bp recheck in office since elevated. She can bring in recorded bp from home to that visit and if possible bring in cuff with her. Certainly let me know if any problems before that.

## 2018-08-04 DIAGNOSIS — M79674 Pain in right toe(s): Secondary | ICD-10-CM | POA: Diagnosis not present

## 2018-08-04 DIAGNOSIS — B351 Tinea unguium: Secondary | ICD-10-CM | POA: Diagnosis not present

## 2018-08-04 DIAGNOSIS — I739 Peripheral vascular disease, unspecified: Secondary | ICD-10-CM | POA: Diagnosis not present

## 2018-08-04 DIAGNOSIS — M79675 Pain in left toe(s): Secondary | ICD-10-CM | POA: Diagnosis not present

## 2018-08-06 DIAGNOSIS — I872 Venous insufficiency (chronic) (peripheral): Secondary | ICD-10-CM | POA: Diagnosis not present

## 2018-08-06 DIAGNOSIS — I890 Lymphedema, not elsewhere classified: Secondary | ICD-10-CM | POA: Diagnosis not present

## 2018-08-06 DIAGNOSIS — M150 Primary generalized (osteo)arthritis: Secondary | ICD-10-CM | POA: Diagnosis not present

## 2018-08-06 DIAGNOSIS — Z87891 Personal history of nicotine dependence: Secondary | ICD-10-CM | POA: Diagnosis not present

## 2018-08-06 DIAGNOSIS — L97812 Non-pressure chronic ulcer of other part of right lower leg with fat layer exposed: Secondary | ICD-10-CM | POA: Diagnosis not present

## 2018-08-06 DIAGNOSIS — I10 Essential (primary) hypertension: Secondary | ICD-10-CM | POA: Diagnosis not present

## 2018-08-07 DIAGNOSIS — I10 Essential (primary) hypertension: Secondary | ICD-10-CM | POA: Diagnosis not present

## 2018-08-07 DIAGNOSIS — I87331 Chronic venous hypertension (idiopathic) with ulcer and inflammation of right lower extremity: Secondary | ICD-10-CM | POA: Diagnosis not present

## 2018-08-07 DIAGNOSIS — L97812 Non-pressure chronic ulcer of other part of right lower leg with fat layer exposed: Secondary | ICD-10-CM | POA: Diagnosis not present

## 2018-08-07 DIAGNOSIS — I87311 Chronic venous hypertension (idiopathic) with ulcer of right lower extremity: Secondary | ICD-10-CM | POA: Diagnosis not present

## 2018-08-07 NOTE — Telephone Encounter (Signed)
ATC. Unable to leave a message.

## 2018-08-11 DIAGNOSIS — M150 Primary generalized (osteo)arthritis: Secondary | ICD-10-CM | POA: Diagnosis not present

## 2018-08-11 DIAGNOSIS — I10 Essential (primary) hypertension: Secondary | ICD-10-CM | POA: Diagnosis not present

## 2018-08-11 DIAGNOSIS — Z87891 Personal history of nicotine dependence: Secondary | ICD-10-CM | POA: Diagnosis not present

## 2018-08-11 DIAGNOSIS — I872 Venous insufficiency (chronic) (peripheral): Secondary | ICD-10-CM | POA: Diagnosis not present

## 2018-08-11 DIAGNOSIS — I890 Lymphedema, not elsewhere classified: Secondary | ICD-10-CM | POA: Diagnosis not present

## 2018-08-11 DIAGNOSIS — L97812 Non-pressure chronic ulcer of other part of right lower leg with fat layer exposed: Secondary | ICD-10-CM | POA: Diagnosis not present

## 2018-08-13 DIAGNOSIS — M150 Primary generalized (osteo)arthritis: Secondary | ICD-10-CM | POA: Diagnosis not present

## 2018-08-13 DIAGNOSIS — I890 Lymphedema, not elsewhere classified: Secondary | ICD-10-CM | POA: Diagnosis not present

## 2018-08-13 DIAGNOSIS — I10 Essential (primary) hypertension: Secondary | ICD-10-CM | POA: Diagnosis not present

## 2018-08-13 DIAGNOSIS — I872 Venous insufficiency (chronic) (peripheral): Secondary | ICD-10-CM | POA: Diagnosis not present

## 2018-08-13 DIAGNOSIS — Z87891 Personal history of nicotine dependence: Secondary | ICD-10-CM | POA: Diagnosis not present

## 2018-08-13 DIAGNOSIS — L97812 Non-pressure chronic ulcer of other part of right lower leg with fat layer exposed: Secondary | ICD-10-CM | POA: Diagnosis not present

## 2018-08-14 DIAGNOSIS — I10 Essential (primary) hypertension: Secondary | ICD-10-CM | POA: Diagnosis not present

## 2018-08-14 DIAGNOSIS — L97812 Non-pressure chronic ulcer of other part of right lower leg with fat layer exposed: Secondary | ICD-10-CM | POA: Diagnosis not present

## 2018-08-14 DIAGNOSIS — M150 Primary generalized (osteo)arthritis: Secondary | ICD-10-CM | POA: Diagnosis not present

## 2018-08-14 DIAGNOSIS — I872 Venous insufficiency (chronic) (peripheral): Secondary | ICD-10-CM | POA: Diagnosis not present

## 2018-08-14 DIAGNOSIS — Z87891 Personal history of nicotine dependence: Secondary | ICD-10-CM | POA: Diagnosis not present

## 2018-08-14 DIAGNOSIS — I890 Lymphedema, not elsewhere classified: Secondary | ICD-10-CM | POA: Diagnosis not present

## 2018-08-14 NOTE — Telephone Encounter (Signed)
ATC unable to leave voicemail.

## 2018-08-17 DIAGNOSIS — M150 Primary generalized (osteo)arthritis: Secondary | ICD-10-CM | POA: Diagnosis not present

## 2018-08-17 DIAGNOSIS — L97812 Non-pressure chronic ulcer of other part of right lower leg with fat layer exposed: Secondary | ICD-10-CM | POA: Diagnosis not present

## 2018-08-17 DIAGNOSIS — I890 Lymphedema, not elsewhere classified: Secondary | ICD-10-CM | POA: Diagnosis not present

## 2018-08-17 DIAGNOSIS — I10 Essential (primary) hypertension: Secondary | ICD-10-CM | POA: Diagnosis not present

## 2018-08-17 DIAGNOSIS — Z87891 Personal history of nicotine dependence: Secondary | ICD-10-CM | POA: Diagnosis not present

## 2018-08-17 DIAGNOSIS — I872 Venous insufficiency (chronic) (peripheral): Secondary | ICD-10-CM | POA: Diagnosis not present

## 2018-08-20 DIAGNOSIS — I890 Lymphedema, not elsewhere classified: Secondary | ICD-10-CM | POA: Diagnosis not present

## 2018-08-20 DIAGNOSIS — Z87891 Personal history of nicotine dependence: Secondary | ICD-10-CM | POA: Diagnosis not present

## 2018-08-20 DIAGNOSIS — I10 Essential (primary) hypertension: Secondary | ICD-10-CM | POA: Diagnosis not present

## 2018-08-20 DIAGNOSIS — I872 Venous insufficiency (chronic) (peripheral): Secondary | ICD-10-CM | POA: Diagnosis not present

## 2018-08-20 DIAGNOSIS — M150 Primary generalized (osteo)arthritis: Secondary | ICD-10-CM | POA: Diagnosis not present

## 2018-08-20 DIAGNOSIS — L97812 Non-pressure chronic ulcer of other part of right lower leg with fat layer exposed: Secondary | ICD-10-CM | POA: Diagnosis not present

## 2018-08-21 NOTE — Telephone Encounter (Signed)
Unable to reach patient. Message will be closed. 

## 2018-08-24 ENCOUNTER — Encounter (HOSPITAL_BASED_OUTPATIENT_CLINIC_OR_DEPARTMENT_OTHER): Payer: Medicare Other | Attending: Internal Medicine

## 2018-08-24 DIAGNOSIS — I87311 Chronic venous hypertension (idiopathic) with ulcer of right lower extremity: Secondary | ICD-10-CM | POA: Diagnosis not present

## 2018-08-24 DIAGNOSIS — I10 Essential (primary) hypertension: Secondary | ICD-10-CM | POA: Diagnosis not present

## 2018-08-24 DIAGNOSIS — I252 Old myocardial infarction: Secondary | ICD-10-CM | POA: Insufficient documentation

## 2018-08-24 DIAGNOSIS — L97812 Non-pressure chronic ulcer of other part of right lower leg with fat layer exposed: Secondary | ICD-10-CM | POA: Diagnosis not present

## 2018-08-24 DIAGNOSIS — I890 Lymphedema, not elsewhere classified: Secondary | ICD-10-CM | POA: Diagnosis not present

## 2018-08-24 DIAGNOSIS — I87331 Chronic venous hypertension (idiopathic) with ulcer and inflammation of right lower extremity: Secondary | ICD-10-CM | POA: Diagnosis not present

## 2018-08-26 DIAGNOSIS — Z87891 Personal history of nicotine dependence: Secondary | ICD-10-CM | POA: Diagnosis not present

## 2018-08-26 DIAGNOSIS — I872 Venous insufficiency (chronic) (peripheral): Secondary | ICD-10-CM | POA: Diagnosis not present

## 2018-08-26 DIAGNOSIS — I10 Essential (primary) hypertension: Secondary | ICD-10-CM | POA: Diagnosis not present

## 2018-08-26 DIAGNOSIS — L97812 Non-pressure chronic ulcer of other part of right lower leg with fat layer exposed: Secondary | ICD-10-CM | POA: Diagnosis not present

## 2018-08-26 DIAGNOSIS — I890 Lymphedema, not elsewhere classified: Secondary | ICD-10-CM | POA: Diagnosis not present

## 2018-08-26 DIAGNOSIS — M150 Primary generalized (osteo)arthritis: Secondary | ICD-10-CM | POA: Diagnosis not present

## 2018-08-28 DIAGNOSIS — L97812 Non-pressure chronic ulcer of other part of right lower leg with fat layer exposed: Secondary | ICD-10-CM | POA: Diagnosis not present

## 2018-08-28 DIAGNOSIS — Z87891 Personal history of nicotine dependence: Secondary | ICD-10-CM | POA: Diagnosis not present

## 2018-08-28 DIAGNOSIS — M150 Primary generalized (osteo)arthritis: Secondary | ICD-10-CM | POA: Diagnosis not present

## 2018-08-28 DIAGNOSIS — I10 Essential (primary) hypertension: Secondary | ICD-10-CM | POA: Diagnosis not present

## 2018-08-28 DIAGNOSIS — I872 Venous insufficiency (chronic) (peripheral): Secondary | ICD-10-CM | POA: Diagnosis not present

## 2018-08-28 DIAGNOSIS — I890 Lymphedema, not elsewhere classified: Secondary | ICD-10-CM | POA: Diagnosis not present

## 2018-08-31 DIAGNOSIS — I87311 Chronic venous hypertension (idiopathic) with ulcer of right lower extremity: Secondary | ICD-10-CM | POA: Diagnosis not present

## 2018-08-31 DIAGNOSIS — I87331 Chronic venous hypertension (idiopathic) with ulcer and inflammation of right lower extremity: Secondary | ICD-10-CM | POA: Diagnosis not present

## 2018-08-31 DIAGNOSIS — I890 Lymphedema, not elsewhere classified: Secondary | ICD-10-CM | POA: Diagnosis not present

## 2018-08-31 DIAGNOSIS — I10 Essential (primary) hypertension: Secondary | ICD-10-CM | POA: Diagnosis not present

## 2018-08-31 DIAGNOSIS — L97812 Non-pressure chronic ulcer of other part of right lower leg with fat layer exposed: Secondary | ICD-10-CM | POA: Diagnosis not present

## 2018-08-31 DIAGNOSIS — I252 Old myocardial infarction: Secondary | ICD-10-CM | POA: Diagnosis not present

## 2018-09-01 DIAGNOSIS — Z87891 Personal history of nicotine dependence: Secondary | ICD-10-CM | POA: Diagnosis not present

## 2018-09-01 DIAGNOSIS — M150 Primary generalized (osteo)arthritis: Secondary | ICD-10-CM | POA: Diagnosis not present

## 2018-09-01 DIAGNOSIS — I890 Lymphedema, not elsewhere classified: Secondary | ICD-10-CM | POA: Diagnosis not present

## 2018-09-01 DIAGNOSIS — L97812 Non-pressure chronic ulcer of other part of right lower leg with fat layer exposed: Secondary | ICD-10-CM | POA: Diagnosis not present

## 2018-09-01 DIAGNOSIS — I10 Essential (primary) hypertension: Secondary | ICD-10-CM | POA: Diagnosis not present

## 2018-09-01 DIAGNOSIS — I872 Venous insufficiency (chronic) (peripheral): Secondary | ICD-10-CM | POA: Diagnosis not present

## 2018-09-02 NOTE — Telephone Encounter (Signed)
Noted. Thanks.

## 2018-09-02 NOTE — Telephone Encounter (Signed)
Noted.  Will send to Dr. Ethlyn Gallery as Juluis Rainier. Nothing further needed.

## 2018-09-02 NOTE — Telephone Encounter (Signed)
Pt called back stating she has had some phone issues which have been resolved. She has been being [facility] and the dr at the [facility] center thinks her wound will be healed in another week's time. OV scheduled for 09/21/18. Please advise

## 2018-09-03 DIAGNOSIS — L97812 Non-pressure chronic ulcer of other part of right lower leg with fat layer exposed: Secondary | ICD-10-CM | POA: Diagnosis not present

## 2018-09-03 DIAGNOSIS — I10 Essential (primary) hypertension: Secondary | ICD-10-CM | POA: Diagnosis not present

## 2018-09-03 DIAGNOSIS — M150 Primary generalized (osteo)arthritis: Secondary | ICD-10-CM | POA: Diagnosis not present

## 2018-09-03 DIAGNOSIS — Z87891 Personal history of nicotine dependence: Secondary | ICD-10-CM | POA: Diagnosis not present

## 2018-09-03 DIAGNOSIS — I872 Venous insufficiency (chronic) (peripheral): Secondary | ICD-10-CM | POA: Diagnosis not present

## 2018-09-03 DIAGNOSIS — I890 Lymphedema, not elsewhere classified: Secondary | ICD-10-CM | POA: Diagnosis not present

## 2018-09-07 ENCOUNTER — Encounter (HOSPITAL_BASED_OUTPATIENT_CLINIC_OR_DEPARTMENT_OTHER): Payer: Medicare Other | Attending: Internal Medicine

## 2018-09-07 DIAGNOSIS — H35033 Hypertensive retinopathy, bilateral: Secondary | ICD-10-CM | POA: Diagnosis not present

## 2018-09-07 DIAGNOSIS — I10 Essential (primary) hypertension: Secondary | ICD-10-CM | POA: Diagnosis not present

## 2018-09-07 DIAGNOSIS — I252 Old myocardial infarction: Secondary | ICD-10-CM | POA: Insufficient documentation

## 2018-09-07 DIAGNOSIS — Z872 Personal history of diseases of the skin and subcutaneous tissue: Secondary | ICD-10-CM | POA: Diagnosis not present

## 2018-09-07 DIAGNOSIS — H43813 Vitreous degeneration, bilateral: Secondary | ICD-10-CM | POA: Diagnosis not present

## 2018-09-07 DIAGNOSIS — I87301 Chronic venous hypertension (idiopathic) without complications of right lower extremity: Secondary | ICD-10-CM | POA: Diagnosis not present

## 2018-09-07 DIAGNOSIS — Z09 Encounter for follow-up examination after completed treatment for conditions other than malignant neoplasm: Secondary | ICD-10-CM | POA: Diagnosis not present

## 2018-09-07 DIAGNOSIS — I890 Lymphedema, not elsewhere classified: Secondary | ICD-10-CM | POA: Diagnosis not present

## 2018-09-07 DIAGNOSIS — S81811A Laceration without foreign body, right lower leg, initial encounter: Secondary | ICD-10-CM | POA: Diagnosis not present

## 2018-09-07 DIAGNOSIS — I872 Venous insufficiency (chronic) (peripheral): Secondary | ICD-10-CM | POA: Diagnosis not present

## 2018-09-07 DIAGNOSIS — H35423 Microcystoid degeneration of retina, bilateral: Secondary | ICD-10-CM | POA: Diagnosis not present

## 2018-09-07 DIAGNOSIS — H353232 Exudative age-related macular degeneration, bilateral, with inactive choroidal neovascularization: Secondary | ICD-10-CM | POA: Diagnosis not present

## 2018-09-08 DIAGNOSIS — I872 Venous insufficiency (chronic) (peripheral): Secondary | ICD-10-CM | POA: Diagnosis not present

## 2018-09-08 DIAGNOSIS — Z87891 Personal history of nicotine dependence: Secondary | ICD-10-CM | POA: Diagnosis not present

## 2018-09-08 DIAGNOSIS — M150 Primary generalized (osteo)arthritis: Secondary | ICD-10-CM | POA: Diagnosis not present

## 2018-09-08 DIAGNOSIS — I890 Lymphedema, not elsewhere classified: Secondary | ICD-10-CM | POA: Diagnosis not present

## 2018-09-08 DIAGNOSIS — I10 Essential (primary) hypertension: Secondary | ICD-10-CM | POA: Diagnosis not present

## 2018-09-08 DIAGNOSIS — L97812 Non-pressure chronic ulcer of other part of right lower leg with fat layer exposed: Secondary | ICD-10-CM | POA: Diagnosis not present

## 2018-09-14 ENCOUNTER — Telehealth: Payer: Self-pay

## 2018-09-14 NOTE — Telephone Encounter (Signed)
Author phoned pt. to offer to schedule awv. Pt. Stated she could not at this time, as she is busy and planning on going to [place] to be with family for a little while now that she is no longer being so closely monitored at the wound clinic. Author stated pt. Could call back at any time to schedule, and pt. Verbalized understanding.

## 2018-09-15 NOTE — Progress Notes (Signed)
Shelley Robinson DOB: [birth year] Encounter date: 09/16/2018  This is a 85 y.o. female who presents with Chief Complaint  Patient presents with  . Blood Pressure Check    History of present illness: At last visit it was noted that bp was elevated. Current medications include hyzaar 100-12.5mg  daily.   Has been at wound center repeatedly. Took a really long time for her to get through with care there. She finally completed treatment on Monday. "I got to ring the bell". Wearing a compression device with velcro which she really likes. States they feel good. They are also easy to get on.   Son is going to come get her in case there is quarantine with the corona virus and bring her to their place.   She has had some right hip pain for last 2-3 months. Thinks related to wound care and more sitting and leg elevating. Bothers her more at night. Bothers her with walking as well (both knees also hurt).   GERD: does well with the protonix; tends to keep symptoms controlled but she has to also watch what she eats. Avoids dark chocolate and spicy foods.   Falls asleep and stays asleep better with trazodone.     Allergies  Allergen Reactions  . Codeine Other (See Comments)  . Metaxalone Other (See Comments)  . Naproxen Other (See Comments)  . Lisinopril Cough  . Piroxicam Other (See Comments)    unknown  . Prednisone Other (See Comments)    hypertensive  . Penicillins Rash    DID THE REACTION INVOLVE: Swelling of the face/tongue/throat, SOB, or low BP? Skin Rash Sudden or severe rash/hives, skin peeling, or the inside of the mouth or nose? Y Did it require medical treatment? N When did it last happen?Over 80 Years Ago. If all above answers are "NO", may proceed with cephalosporin use.    Current Meds  Medication Sig  . acetaminophen (TYLENOL) 500 MG tablet Take 1 tablet (500 mg total) by mouth every 6 (six) hours as needed for pain. Take one  in AM and one at qhs, and one at noon if  needed (Patient taking differently: Take 500 mg by mouth every 8 (eight) hours as needed for moderate pain. Take one  in AM and one at qhs, and one at noon if needed)  . diclofenac sodium (VOLTAREN) 1 % GEL Apply 4 g topically 4 (four) times daily. (Patient taking differently: Apply 4 g topically 4 (four) times daily as needed (pain). )  . loratadine (CLARITIN) 10 MG tablet Take 10 mg by mouth daily. Not sure of the mg/lc  . losartan-hydrochlorothiazide (HYZAAR) 100-12.5 MG tablet Take 1 tablet by mouth daily.  . Multiple Vitamins-Minerals (MULTIVITAMIN ADULT) CHEW Chew 1 tablet by mouth daily.  . Multiple Vitamins-Minerals (PRESERVISION AREDS 2 PO) Take 2 tablets by mouth daily.   . pantoprazole (PROTONIX) 40 MG tablet Take 1 tablet (40 mg total) by mouth daily.  . rosuvastatin (CRESTOR) 10 MG tablet Take 1 tablet (10 mg total) by mouth daily.  Marland Kitchen SHINGRIX injection LOT                     RA/LA                   EXP                    MANU  . traZODone (DESYREL) 50 MG tablet 1 tablet at bedtime as needed for sleep (Patient  taking differently: Take 50 mg by mouth at bedtime. )  . [DISCONTINUED] conjugated estrogens (PREMARIN) vaginal cream Place 1 Applicatorful vaginally as needed. (Patient taking differently: Place 1 Applicatorful vaginally daily as needed (dryness). )    Review of Systems  Constitutional: Negative for chills, fatigue and fever.  Respiratory: Negative for cough, chest tightness, shortness of breath and wheezing.   Cardiovascular: Negative for chest pain, palpitations and leg swelling.  Musculoskeletal: Positive for arthralgias and back pain.  Skin: Negative for wound. Rash: erythema under breasts has improved with stick deodorant; dry skin improved with eucerin.    Objective:  BP 120/70 (BP Location: Left Arm, Patient Position: Sitting, Cuff Size: Normal)   Pulse 88   Temp 98.3 F (36.8 C) (Oral)   Wt 170 lb 3.2 oz (77.2 kg)   SpO2 91%   BMI 33.24 kg/m   Weight: 170 lb  3.2 oz (77.2 kg)   BP Readings from Last 3 Encounters:  09/16/18 120/70  07/15/18 (!) 170/100  06/28/18 (!) 158/91   Wt Readings from Last 3 Encounters:  09/16/18 170 lb 3.2 oz (77.2 kg)  07/15/18 173 lb 4.8 oz (78.6 kg)  06/17/18 175 lb 2 oz (79.4 kg)    Physical Exam Constitutional:      General: She is not in acute distress.    Appearance: She is well-developed.  Cardiovascular:     Rate and Rhythm: Normal rate and regular rhythm.     Heart sounds: Murmur present. Systolic murmur present with a grade of 2/6. No friction rub.  Pulmonary:     Effort: Pulmonary effort is normal. No respiratory distress.     Breath sounds: Normal breath sounds. No wheezing or rales.  Musculoskeletal:     Right lower leg: No edema.     Left lower leg: No edema.  Neurological:     Mental Status: She is alert and oriented to person, place, and time.  Psychiatric:        Behavior: Behavior normal.     Assessment/Plan 1. Essential hypertension Stble.  Continue current medication.  Home readings have been very good.  2. Primary osteoarthritis involving multiple joints Chronic.  Discussed using the diclofenac gel for joint pain.  She will try this again.  3. Dyslipidemia Continue Crestor.  4. Trochanteric bursitis, right hip Discussed icing and stretches that will help with bursitis as well as sacroiliac joint pain.  Modified stretches and that she is able to do these in seated or lying down position.  She was able to do all stretches with me today in the office.  Let me know if not feeling some improvement within 2 weeks time.  5. Sacroiliac joint pain see above    Return in about 6 months (around 03/19/2019).  Reviewed all medications with her today in the office.  I wrote on her medication list what each medication is used for.  Micheline Rough, MD

## 2018-09-16 ENCOUNTER — Ambulatory Visit (INDEPENDENT_AMBULATORY_CARE_PROVIDER_SITE_OTHER): Payer: Medicare Other | Admitting: Family Medicine

## 2018-09-16 ENCOUNTER — Other Ambulatory Visit: Payer: Self-pay

## 2018-09-16 ENCOUNTER — Encounter: Payer: Self-pay | Admitting: Family Medicine

## 2018-09-16 VITALS — BP 120/70 | HR 88 | Temp 98.3°F | Wt 170.2 lb

## 2018-09-16 DIAGNOSIS — M533 Sacrococcygeal disorders, not elsewhere classified: Secondary | ICD-10-CM | POA: Diagnosis not present

## 2018-09-16 DIAGNOSIS — M7061 Trochanteric bursitis, right hip: Secondary | ICD-10-CM

## 2018-09-16 DIAGNOSIS — M150 Primary generalized (osteo)arthritis: Secondary | ICD-10-CM | POA: Diagnosis not present

## 2018-09-16 DIAGNOSIS — M159 Polyosteoarthritis, unspecified: Secondary | ICD-10-CM

## 2018-09-16 DIAGNOSIS — I10 Essential (primary) hypertension: Secondary | ICD-10-CM | POA: Diagnosis not present

## 2018-09-16 DIAGNOSIS — E785 Hyperlipidemia, unspecified: Secondary | ICD-10-CM

## 2018-09-16 NOTE — Patient Instructions (Signed)
Try to ice outside right hip 20 minutes twice daily for next 2 weeks.   Try to do belt stretches with both legs - putting belt under ball of foot/shoe and straightening leg. Then take belt in left hand and stretch leg to side.   Figure 4 leg stretches to stretch butt. Sitting in chair, leaning forward.   Cross ankles and bend forward.

## 2018-09-21 ENCOUNTER — Ambulatory Visit: Payer: Medicare Other | Admitting: Family Medicine

## 2018-09-22 ENCOUNTER — Other Ambulatory Visit: Payer: Self-pay | Admitting: Internal Medicine

## 2018-09-29 ENCOUNTER — Telehealth: Payer: Self-pay | Admitting: Family Medicine

## 2018-09-29 NOTE — Telephone Encounter (Signed)
Copied from [place] [id number]. Topic: General - Other >> Sep 29, 2018  1:44 PM Lennox Solders wrote: Reason for CRM: pt daughter teresa left message on refill voicemail her mother needs a refill on crestor. [facility] in [place]

## 2018-09-29 NOTE — Telephone Encounter (Signed)
Last filled 06/05/18 by Dr. Charlane Ferretti to fill?

## 2018-09-30 ENCOUNTER — Other Ambulatory Visit: Payer: Self-pay | Admitting: Family Medicine

## 2018-09-30 MED ORDER — ROSUVASTATIN CALCIUM 10 MG PO TABS: 10.0000 mg | ORAL_TABLET | Freq: Every day | ORAL | 3 refills | Status: DC

## 2018-09-30 NOTE — Telephone Encounter (Signed)
Refill has been sent.  °

## 2018-11-09 DIAGNOSIS — M170 Bilateral primary osteoarthritis of knee: Secondary | ICD-10-CM | POA: Diagnosis not present

## 2018-11-09 DIAGNOSIS — M25561 Pain in right knee: Secondary | ICD-10-CM | POA: Diagnosis not present

## 2018-11-09 DIAGNOSIS — M25562 Pain in left knee: Secondary | ICD-10-CM | POA: Diagnosis not present

## 2018-11-23 ENCOUNTER — Other Ambulatory Visit: Payer: Self-pay | Admitting: Family Medicine

## 2018-11-23 ENCOUNTER — Telehealth: Payer: Self-pay | Admitting: Family Medicine

## 2018-11-23 MED ORDER — LOSARTAN POTASSIUM-HCTZ 100-12.5 MG PO TABS: 1.0000 | ORAL_TABLET | Freq: Every day | ORAL | 3 refills | Status: AC

## 2018-11-23 MED ORDER — ESOMEPRAZOLE MAGNESIUM 40 MG PO CPDR: 40.0000 mg | DELAYED_RELEASE_CAPSULE | Freq: Every day | ORAL | 3 refills | Status: AC

## 2018-11-23 NOTE — Telephone Encounter (Signed)
Copied from [place] [phone] [id number]. Topic: Quick Communication - Rx Refill/Question >> Nov 23, 2018 11:54 AM Margot Ables wrote: Medication: requesting change from protonix 40mg  to nexium 40mg  - Dtr Helene Kelp is a pharmacist and has been trialing pt on Nexium 40mg  for 10 days and she has had no issues with reflux. Prior to that pt was having 3x per week episodes where she would choke and have to spit out in the toilet/reflux of clear phlegm with Protonix. She notes that Dr. Raliegh Ip originally ordered Nexium but insurance did not cover at that time. Please advise.   Also requesting future fills rx for losartan-hydrochlorothiazide (HYZAAR) 100-12.5 MG tablet.   Has the patient contacted their pharmacy? no Preferred Pharmacy (with phone number or street name): [facility], [place] - [address] [phone] (Phone) [phone] (Fax)

## 2018-11-23 NOTE — Telephone Encounter (Signed)
meds refilled and nexium sent. Protonix removed from list. Glad this is helping her!

## 2018-11-24 NOTE — Telephone Encounter (Signed)
I left a detailed message with the information below at the pts cell number. 

## 2018-11-28 DIAGNOSIS — I361 Nonrheumatic tricuspid (valve) insufficiency: Secondary | ICD-10-CM | POA: Diagnosis not present

## 2018-11-28 DIAGNOSIS — N184 Chronic kidney disease, stage 4 (severe): Secondary | ICD-10-CM | POA: Diagnosis not present

## 2018-11-28 DIAGNOSIS — I4510 Unspecified right bundle-branch block: Secondary | ICD-10-CM | POA: Diagnosis not present

## 2018-11-28 DIAGNOSIS — I129 Hypertensive chronic kidney disease with stage 1 through stage 4 chronic kidney disease, or unspecified chronic kidney disease: Secondary | ICD-10-CM | POA: Diagnosis not present

## 2018-11-28 DIAGNOSIS — I351 Nonrheumatic aortic (valve) insufficiency: Secondary | ICD-10-CM | POA: Diagnosis not present

## 2018-11-28 DIAGNOSIS — Z87891 Personal history of nicotine dependence: Secondary | ICD-10-CM | POA: Diagnosis not present

## 2018-11-28 DIAGNOSIS — Z7982 Long term (current) use of aspirin: Secondary | ICD-10-CM | POA: Diagnosis not present

## 2018-11-28 DIAGNOSIS — M79602 Pain in left arm: Secondary | ICD-10-CM | POA: Diagnosis not present

## 2018-11-28 DIAGNOSIS — I340 Nonrheumatic mitral (valve) insufficiency: Secondary | ICD-10-CM | POA: Diagnosis not present

## 2018-11-28 DIAGNOSIS — Z8679 Personal history of other diseases of the circulatory system: Secondary | ICD-10-CM | POA: Diagnosis not present

## 2018-11-28 DIAGNOSIS — R0602 Shortness of breath: Secondary | ICD-10-CM | POA: Diagnosis not present

## 2018-11-28 DIAGNOSIS — Z9049 Acquired absence of other specified parts of digestive tract: Secondary | ICD-10-CM | POA: Diagnosis not present

## 2018-11-28 DIAGNOSIS — Z90710 Acquired absence of both cervix and uterus: Secondary | ICD-10-CM | POA: Diagnosis not present

## 2018-11-28 DIAGNOSIS — I700 Atherosclerosis of aorta: Secondary | ICD-10-CM | POA: Diagnosis not present

## 2018-11-28 DIAGNOSIS — I252 Old myocardial infarction: Secondary | ICD-10-CM | POA: Diagnosis not present

## 2018-11-28 DIAGNOSIS — M25512 Pain in left shoulder: Secondary | ICD-10-CM | POA: Diagnosis not present

## 2018-11-28 DIAGNOSIS — R079 Chest pain, unspecified: Secondary | ICD-10-CM | POA: Diagnosis not present

## 2018-11-28 DIAGNOSIS — S42302S Unspecified fracture of shaft of humerus, left arm, sequela: Secondary | ICD-10-CM | POA: Diagnosis not present

## 2018-11-29 DIAGNOSIS — I129 Hypertensive chronic kidney disease with stage 1 through stage 4 chronic kidney disease, or unspecified chronic kidney disease: Secondary | ICD-10-CM | POA: Diagnosis not present

## 2018-11-29 DIAGNOSIS — I361 Nonrheumatic tricuspid (valve) insufficiency: Secondary | ICD-10-CM | POA: Diagnosis not present

## 2018-11-29 DIAGNOSIS — S42302S Unspecified fracture of shaft of humerus, left arm, sequela: Secondary | ICD-10-CM | POA: Diagnosis not present

## 2018-11-29 DIAGNOSIS — M25512 Pain in left shoulder: Secondary | ICD-10-CM | POA: Diagnosis not present

## 2018-11-29 DIAGNOSIS — I351 Nonrheumatic aortic (valve) insufficiency: Secondary | ICD-10-CM | POA: Diagnosis not present

## 2018-11-29 DIAGNOSIS — I4510 Unspecified right bundle-branch block: Secondary | ICD-10-CM | POA: Diagnosis not present

## 2018-11-29 DIAGNOSIS — M79602 Pain in left arm: Secondary | ICD-10-CM | POA: Diagnosis not present

## 2018-11-29 DIAGNOSIS — R0602 Shortness of breath: Secondary | ICD-10-CM | POA: Diagnosis not present

## 2018-11-29 DIAGNOSIS — I340 Nonrheumatic mitral (valve) insufficiency: Secondary | ICD-10-CM | POA: Diagnosis not present

## 2018-11-29 DIAGNOSIS — R079 Chest pain, unspecified: Secondary | ICD-10-CM | POA: Diagnosis not present

## 2018-11-29 DIAGNOSIS — N184 Chronic kidney disease, stage 4 (severe): Secondary | ICD-10-CM | POA: Diagnosis not present

## 2018-11-29 DIAGNOSIS — X58XXXA Exposure to other specified factors, initial encounter: Secondary | ICD-10-CM | POA: Diagnosis not present

## 2018-11-30 DIAGNOSIS — R0602 Shortness of breath: Secondary | ICD-10-CM | POA: Diagnosis not present

## 2018-11-30 DIAGNOSIS — R079 Chest pain, unspecified: Secondary | ICD-10-CM | POA: Diagnosis not present

## 2018-11-30 DIAGNOSIS — I351 Nonrheumatic aortic (valve) insufficiency: Secondary | ICD-10-CM | POA: Diagnosis not present

## 2018-11-30 DIAGNOSIS — N184 Chronic kidney disease, stage 4 (severe): Secondary | ICD-10-CM | POA: Diagnosis not present

## 2018-11-30 DIAGNOSIS — S42302S Unspecified fracture of shaft of humerus, left arm, sequela: Secondary | ICD-10-CM | POA: Diagnosis not present

## 2018-11-30 DIAGNOSIS — I340 Nonrheumatic mitral (valve) insufficiency: Secondary | ICD-10-CM | POA: Diagnosis not present

## 2018-11-30 DIAGNOSIS — I361 Nonrheumatic tricuspid (valve) insufficiency: Secondary | ICD-10-CM | POA: Diagnosis not present

## 2018-11-30 DIAGNOSIS — M25512 Pain in left shoulder: Secondary | ICD-10-CM | POA: Diagnosis not present

## 2018-11-30 DIAGNOSIS — X58XXXA Exposure to other specified factors, initial encounter: Secondary | ICD-10-CM | POA: Diagnosis not present

## 2018-11-30 DIAGNOSIS — I129 Hypertensive chronic kidney disease with stage 1 through stage 4 chronic kidney disease, or unspecified chronic kidney disease: Secondary | ICD-10-CM | POA: Diagnosis not present

## 2018-11-30 DIAGNOSIS — M79602 Pain in left arm: Secondary | ICD-10-CM | POA: Diagnosis not present

## 2018-11-30 DIAGNOSIS — I4510 Unspecified right bundle-branch block: Secondary | ICD-10-CM | POA: Diagnosis not present

## 2018-12-14 DIAGNOSIS — M25561 Pain in right knee: Secondary | ICD-10-CM | POA: Diagnosis not present

## 2018-12-14 DIAGNOSIS — M170 Bilateral primary osteoarthritis of knee: Secondary | ICD-10-CM | POA: Diagnosis not present

## 2018-12-14 DIAGNOSIS — M25562 Pain in left knee: Secondary | ICD-10-CM | POA: Diagnosis not present

## 2018-12-16 ENCOUNTER — Telehealth: Payer: Self-pay | Admitting: Family Medicine

## 2018-12-16 ENCOUNTER — Other Ambulatory Visit: Payer: Self-pay | Admitting: Family Medicine

## 2018-12-16 MED ORDER — TRAZODONE HCL 50 MG PO TABS: ORAL_TABLET | ORAL | 2 refills | Status: DC

## 2018-12-16 NOTE — Telephone Encounter (Unsigned)
Copied from [place] [id number]. Topic: Quick Communication - Rx Refill/Question >> Dec 16, 2018  3:12 PM Celene Kras A wrote: Medication: traZODone (DESYREL) 50 MG tablet  Has the patient contacted their pharmacy? Yes.  [facility], pharmacist, called stating they sent over fax for this medication on 12/12/2018. Please advise . (Agent: If no, request that the patient contact the pharmacy for the refill.) (Agent: If yes, when and what did the pharmacy advise?)  Preferred Pharmacy (with phone number or street name): [facility], [place] [address] Phone: [phone] Fax: [phone] Not a 24 hour pharmacy; exact hours not known.    Agent: Please be advised that RX refills may take up to 3 business days. We ask that you follow-up with your pharmacy.

## 2018-12-16 NOTE — Telephone Encounter (Signed)
done

## 2018-12-21 DIAGNOSIS — M25561 Pain in right knee: Secondary | ICD-10-CM | POA: Diagnosis not present

## 2018-12-21 DIAGNOSIS — M25562 Pain in left knee: Secondary | ICD-10-CM | POA: Diagnosis not present

## 2018-12-21 DIAGNOSIS — M170 Bilateral primary osteoarthritis of knee: Secondary | ICD-10-CM | POA: Diagnosis not present

## 2018-12-25 ENCOUNTER — Other Ambulatory Visit: Payer: Self-pay

## 2018-12-25 ENCOUNTER — Encounter: Payer: Self-pay | Admitting: Family Medicine

## 2018-12-25 ENCOUNTER — Ambulatory Visit (INDEPENDENT_AMBULATORY_CARE_PROVIDER_SITE_OTHER): Payer: Medicare Other | Admitting: Family Medicine

## 2018-12-25 DIAGNOSIS — I10 Essential (primary) hypertension: Secondary | ICD-10-CM

## 2018-12-25 DIAGNOSIS — E785 Hyperlipidemia, unspecified: Secondary | ICD-10-CM | POA: Diagnosis not present

## 2018-12-25 DIAGNOSIS — R7989 Other specified abnormal findings of blood chemistry: Secondary | ICD-10-CM | POA: Diagnosis not present

## 2018-12-25 DIAGNOSIS — M170 Bilateral primary osteoarthritis of knee: Secondary | ICD-10-CM | POA: Diagnosis not present

## 2018-12-25 DIAGNOSIS — R319 Hematuria, unspecified: Secondary | ICD-10-CM

## 2018-12-25 NOTE — Progress Notes (Signed)
Virtual Visit via Video Note  I connected with Shelley Robinson  on 12/25/18 at  4:00 PM EDT by a video enabled telemedicine application and verified that I am speaking with the correct person using two identifiers.  Location patient: home Location provider:work or home office Persons participating in the virtual visit: patient, provider  I discussed the limitations of evaluation and management by telemedicine and the availability of in person appointments. The patient expressed understanding and agreed to proceed.   Shelley Robinson DOB: March 29, 1927 Encounter date: 12/25/2018  This is a 85 y.o. female who presents with Chief Complaint  Patient presents with  . Hospitalization Follow-up    patient is especially concerned about being diagnosed with kidney diease as she thought her symptoms were releated to her heart    History of present illness: Went to hospital with some left arm pain. Had another arm pain when she went in. Had a couple of sharp pains in arm at home and then one in hospital; and then it stopped. Had evaluation there; was kept for obs and echo and ended up staying longer due to holiday weekend. All enzymes were negative. Only issue that cardiologist commented on was kidneys.  They were told that she had stage IV chronic kidney disease and this was not further discussed as it was assumed by physicians at that hospital that this was a chronic condition for her.  Had one episode where she had a little blood clot x 1 in commode.   Has had little pains in back "like a nerve" - notes this about half way up back - thoracic in middle. Has some upper thoracic pain with tapping. No fevers or chills. No odor to urine. No leaking of urine.   Feels like she is suffocating when she wears mask; but otherwise feels ok. Otherwise feels well.   Eating ok, appetite is good. Eating healthy.   Not checking blood pressures at home at all. They were checking around the clock while she was  there and it was stable.   Nexium is working well for GERD.   AREDs vitamin daily for eyes.   Taking claritin daily.   Uses pain cream - lidocaine, diclofenac 3%, amitriptyline.     Allergies  Allergen Reactions  . Codeine Other (See Comments)  . Metaxalone Other (See Comments)  . Naproxen Other (See Comments)  . Lisinopril Cough  . Piroxicam Other (See Comments)    unknown  . Prednisone Other (See Comments)    hypertensive  . Sulfa Antibiotics     Reaction unknown  . Penicillins Rash    DID THE REACTION INVOLVE: Swelling of the face/tongue/throat, SOB, or low BP? Skin Rash Sudden or severe rash/hives, skin peeling, or the inside of the mouth or nose? Y Did it require medical treatment? N When did it last happen?Over 29 Years Ago. If all above answers are "NO", may proceed with cephalosporin use.    Current Meds  Medication Sig  . acetaminophen (TYLENOL) 500 MG tablet Take 1 tablet (500 mg total) by mouth every 6 (six) hours as needed for pain. Take one  in AM and one at qhs, and one at noon if needed (Patient taking differently: Take 500 mg by mouth every 8 (eight) hours as needed for moderate pain. Take one  in AM and one at qhs, and one at noon if needed)  . diclofenac sodium (VOLTAREN) 1 % GEL Apply 4 g topically 4 (four) times daily. (Patient taking differently: Apply  4 g topically 4 (four) times daily as needed (pain). )  . esomeprazole (NEXIUM) 40 MG capsule Take 1 capsule (40 mg total) by mouth daily before supper.  . loratadine (CLARITIN) 10 MG tablet Take 10 mg by mouth daily. Not sure of the mg/lc  . losartan-hydrochlorothiazide (HYZAAR) 100-12.5 MG tablet Take 1 tablet by mouth daily.  . Multiple Vitamins-Minerals (MULTIVITAMIN ADULT) CHEW Chew 1 tablet by mouth daily.  . Multiple Vitamins-Minerals (PRESERVISION AREDS 2 PO) Take 2 tablets by mouth daily.   . rosuvastatin (CRESTOR) 10 MG tablet Take 1 tablet (10 mg total) by mouth daily.  Marland Kitchen SHINGRIX injection  LOT                     RA/LA                   EXP                    MANU  . traZODone (DESYREL) 50 MG tablet 1 tablet at bedtime as needed for sleep    Review of Systems  Constitutional: Negative for chills, fatigue and fever.  Respiratory: Negative for cough, chest tightness, shortness of breath and wheezing.   Cardiovascular: Negative for chest pain, palpitations and leg swelling.  Genitourinary: Positive for hematuria (single small blood clot in urination; no recurrence). Negative for difficulty urinating, dysuria, flank pain and frequency.    Objective:  There were no vitals taken for this visit.      BP Readings from Last 3 Encounters:  09/16/18 120/70  07/15/18 (!) 170/100  06/28/18 (!) 158/91   Wt Readings from Last 3 Encounters:  09/16/18 170 lb 3.2 oz (77.2 kg)  07/15/18 173 lb 4.8 oz (78.6 kg)  06/17/18 175 lb 2 oz (79.4 kg)    EXAM:  GENERAL: alert, oriented, appears well and in no acute distress  HEENT: atraumatic, conjunctiva clear, no obvious abnormalities on inspection of external nose and ears  NECK: normal movements of the head and neck  LUNGS: on inspection no signs of respiratory distress, breathing rate appears normal, no obvious gross SOB, gasping or wheezing  CV: no obvious cyanosis  MS: moves all visible extremities without noticeable abnormality. Some tenderness when daughter taps on upper/thoracic back - right shoulder blade area.   PSYCH/NEURO: pleasant and cooperative, no obvious depression or anxiety, speech and thought processing grossly intact   Assessment/Plan 1. Essential hypertension Has been well controlled.  For now, we will continue current medications.  I am concerned with mention of kidney issues from out of town hospital, so we will follow-up with a repeat metabolic panel and a UA.  Of interest, medications were not changed at all during hospitalization and no nephrology specialist was consulted.  Blood work or urinalysis from  hospital will be reviewed along with new blood work once completed next week.  Did advise her to stay hydrated and to be well-hydrated for blood work. - Comprehensive metabolic panel; Future - CBC with Differential/Platelet; Future  2. Dyslipidemia Continue current medication. - Lipid panel; Future  3. Hematuria, unspecified type Single episode of blood noted in urine.  Will check urine with blood work this week. - Urinalysis; Future - Culture, Urine; Future   4. Elevated serum creatinine See above.  Patient and daughter was told about concern for kidney function with when she was in the hospital recently.  She has not had kidney issues in the past, so we will recheck blood work  and medications if needed.  Return Pending blood work.   I discussed the assessment and treatment plan with the patient. The patient was provided an opportunity to ask questions and all were answered. The patient agreed with the plan and demonstrated an understanding of the instructions.   The patient was advised to call back or seek an in-person evaluation if the symptoms worsen or if the condition fails to improve as anticipated.  I provided 30 minutes of non-face-to-face time during this encounter.   Micheline Rough, MD

## 2018-12-28 ENCOUNTER — Other Ambulatory Visit (INDEPENDENT_AMBULATORY_CARE_PROVIDER_SITE_OTHER): Payer: Medicare Other

## 2018-12-28 ENCOUNTER — Other Ambulatory Visit: Payer: Self-pay

## 2018-12-28 ENCOUNTER — Telehealth: Payer: Self-pay | Admitting: *Deleted

## 2018-12-28 DIAGNOSIS — E785 Hyperlipidemia, unspecified: Secondary | ICD-10-CM

## 2018-12-28 DIAGNOSIS — R319 Hematuria, unspecified: Secondary | ICD-10-CM

## 2018-12-28 DIAGNOSIS — I10 Essential (primary) hypertension: Secondary | ICD-10-CM

## 2018-12-28 LAB — URINALYSIS, ROUTINE W REFLEX MICROSCOPIC
Bilirubin Urine: NEGATIVE
Hgb urine dipstick: NEGATIVE
Ketones, ur: NEGATIVE
Nitrite: NEGATIVE
RBC / HPF: NONE SEEN (ref 0–?)
Specific Gravity, Urine: 1.01 (ref 1.000–1.030)
Total Protein, Urine: 30 — AB
Urine Glucose: NEGATIVE
Urobilinogen, UA: 0.2 (ref 0.0–1.0)
pH: 5.5 (ref 5.0–8.0)

## 2018-12-28 LAB — COMPREHENSIVE METABOLIC PANEL
ALT: 446 U/L — ABNORMAL HIGH (ref 0–35)
AST: 700 U/L — ABNORMAL HIGH (ref 0–37)
Albumin: 3.6 g/dL (ref 3.5–5.2)
Alkaline Phosphatase: 256 U/L — ABNORMAL HIGH (ref 39–117)
BUN: 35 mg/dL — ABNORMAL HIGH (ref 6–23)
CO2: 29 mEq/L (ref 19–32)
Calcium: 8.3 mg/dL — ABNORMAL LOW (ref 8.4–10.5)
Chloride: 104 mEq/L (ref 96–112)
Creatinine, Ser: 1.58 mg/dL — ABNORMAL HIGH (ref 0.40–1.20)
GFR: 30.72 mL/min — ABNORMAL LOW (ref >60.00)
Glucose, Bld: 120 mg/dL — ABNORMAL HIGH (ref 70–99)
Potassium: 3.8 mEq/L (ref 3.5–5.1)
Sodium: 140 mEq/L (ref 135–145)
Total Bilirubin: 0.7 mg/dL (ref 0.2–1.2)
Total Protein: 5.6 g/dL — ABNORMAL LOW (ref 6.0–8.3)

## 2018-12-28 LAB — CBC WITH DIFFERENTIAL/PLATELET
Basophils Absolute: 0 10*3/uL (ref 0.0–0.1)
Basophils Relative: 0.3 % (ref 0.0–3.0)
Eosinophils Absolute: 0 10*3/uL (ref 0.0–0.7)
Eosinophils Relative: 0.8 % (ref 0.0–5.0)
HCT: 37.5 % (ref 36.0–46.0)
Hemoglobin: 12.5 g/dL (ref 12.0–15.0)
Lymphocytes Relative: 18.6 % (ref 12.0–46.0)
Lymphs Abs: 0.9 10*3/uL (ref 0.7–4.0)
MCHC: 33.4 g/dL (ref 30.0–36.0)
MCV: 97.8 fl (ref 78.0–100.0)
Monocytes Absolute: 0.5 10*3/uL (ref 0.1–1.0)
Monocytes Relative: 10.4 % (ref 3.0–12.0)
Neutro Abs: 3.2 10*3/uL (ref 1.4–7.7)
Neutrophils Relative %: 69.9 % (ref 43.0–77.0)
Platelets: 158 10*3/uL (ref 150.0–400.0)
RBC: 3.83 Mil/uL — ABNORMAL LOW (ref 3.87–5.11)
RDW: 13 % (ref 11.5–15.5)
WBC: 4.6 10*3/uL (ref 4.0–10.5)

## 2018-12-28 LAB — LIPID PANEL
Cholesterol: 164 mg/dL (ref 0–200)
HDL: 53.8 mg/dL (ref >39.00)
LDL Cholesterol: 80 mg/dL (ref 0–99)
NonHDL: 110.26
Total CHOL/HDL Ratio: 3
Triglycerides: 153 mg/dL — ABNORMAL HIGH (ref 0.0–149.0)
VLDL: 30.6 mg/dL (ref 0.0–40.0)

## 2018-12-28 NOTE — Telephone Encounter (Signed)
I called the pts daughter and she stated the pt had a lab appt earlier today at 1pm.

## 2018-12-28 NOTE — Telephone Encounter (Signed)
-----   Message from Caren Macadam, MD sent at 12/25/2018  5:50 PM EDT ----- Sorry forgot to tell you to call daughter Clarene Critchley) at the [phone] number to schedule bloodwork. Thanks!

## 2018-12-30 ENCOUNTER — Telehealth: Payer: Self-pay | Admitting: *Deleted

## 2018-12-30 ENCOUNTER — Other Ambulatory Visit: Payer: Self-pay | Admitting: Family Medicine

## 2018-12-30 DIAGNOSIS — R748 Abnormal levels of other serum enzymes: Secondary | ICD-10-CM

## 2018-12-30 NOTE — Telephone Encounter (Signed)
-----   Message from Caren Macadam, MD sent at 12/25/2018  4:32 PM EDT ----- Please schedule lab visit for her for early next week. Are you able to pull hospital records/labs from her recent visit as well so I can re-review? I had looked at these and put them to scan but need them for comparison. Thanks!

## 2018-12-30 NOTE — Telephone Encounter (Signed)
Records placed on Dr Berenice Bouton desk.

## 2018-12-31 LAB — URINE CULTURE
MICRO NUMBER:: 594367
SPECIMEN QUALITY:: ADEQUATE

## 2019-01-04 ENCOUNTER — Other Ambulatory Visit: Payer: Self-pay | Admitting: Family Medicine

## 2019-01-04 DIAGNOSIS — R7989 Other specified abnormal findings of blood chemistry: Secondary | ICD-10-CM

## 2019-01-04 DIAGNOSIS — R748 Abnormal levels of other serum enzymes: Secondary | ICD-10-CM

## 2019-01-11 ENCOUNTER — Other Ambulatory Visit: Payer: Self-pay

## 2019-01-11 ENCOUNTER — Other Ambulatory Visit (INDEPENDENT_AMBULATORY_CARE_PROVIDER_SITE_OTHER): Payer: Medicare Other

## 2019-01-11 DIAGNOSIS — R7989 Other specified abnormal findings of blood chemistry: Secondary | ICD-10-CM

## 2019-01-11 DIAGNOSIS — R748 Abnormal levels of other serum enzymes: Secondary | ICD-10-CM

## 2019-01-11 LAB — BASIC METABOLIC PANEL
BUN: 30 mg/dL — ABNORMAL HIGH (ref 6–23)
CO2: 30 mEq/L (ref 19–32)
Calcium: 8.8 mg/dL (ref 8.4–10.5)
Chloride: 104 mEq/L (ref 96–112)
Creatinine, Ser: 1.37 mg/dL — ABNORMAL HIGH (ref 0.40–1.20)
GFR: 36.21 mL/min — ABNORMAL LOW (ref >60.00)
Glucose, Bld: 111 mg/dL — ABNORMAL HIGH (ref 70–99)
Potassium: 4.3 mEq/L (ref 3.5–5.1)
Sodium: 143 mEq/L (ref 135–145)

## 2019-01-11 LAB — HEPATIC FUNCTION PANEL
ALT: 22 U/L (ref 0–35)
AST: 18 U/L (ref 0–37)
Albumin: 4 g/dL (ref 3.5–5.2)
Alkaline Phosphatase: 155 U/L — ABNORMAL HIGH (ref 39–117)
Bilirubin, Direct: 0.1 mg/dL (ref 0.0–0.3)
Total Bilirubin: 0.6 mg/dL (ref 0.2–1.2)
Total Protein: 6.5 g/dL (ref 6.0–8.3)

## 2019-01-27 ENCOUNTER — Ambulatory Visit: Payer: Medicare Other | Admitting: Gastroenterology

## 2019-03-22 ENCOUNTER — Encounter: Payer: Self-pay | Admitting: Family Medicine

## 2019-03-22 ENCOUNTER — Ambulatory Visit (INDEPENDENT_AMBULATORY_CARE_PROVIDER_SITE_OTHER): Payer: Medicare Other | Admitting: Family Medicine

## 2019-03-22 ENCOUNTER — Other Ambulatory Visit: Payer: Self-pay

## 2019-03-22 VITALS — Wt 159.4 lb

## 2019-03-22 DIAGNOSIS — E785 Hyperlipidemia, unspecified: Secondary | ICD-10-CM | POA: Diagnosis not present

## 2019-03-22 DIAGNOSIS — I10 Essential (primary) hypertension: Secondary | ICD-10-CM

## 2019-03-22 DIAGNOSIS — K219 Gastro-esophageal reflux disease without esophagitis: Secondary | ICD-10-CM | POA: Insufficient documentation

## 2019-03-22 DIAGNOSIS — R350 Frequency of micturition: Secondary | ICD-10-CM | POA: Diagnosis not present

## 2019-03-22 DIAGNOSIS — G4700 Insomnia, unspecified: Secondary | ICD-10-CM | POA: Diagnosis not present

## 2019-03-22 DIAGNOSIS — R748 Abnormal levels of other serum enzymes: Secondary | ICD-10-CM | POA: Diagnosis not present

## 2019-03-22 DIAGNOSIS — J3489 Other specified disorders of nose and nasal sinuses: Secondary | ICD-10-CM

## 2019-03-22 DIAGNOSIS — M150 Primary generalized (osteo)arthritis: Secondary | ICD-10-CM | POA: Diagnosis not present

## 2019-03-22 DIAGNOSIS — M159 Polyosteoarthritis, unspecified: Secondary | ICD-10-CM

## 2019-03-22 MED ORDER — MUPIROCIN 2 % EX OINT: 1.0000 "application " | TOPICAL_OINTMENT | Freq: Two times a day (BID) | CUTANEOUS | 0 refills | Status: DC

## 2019-03-22 NOTE — Progress Notes (Signed)
Virtual Visit via Telephone Note  I connected with Shelley Robinson  on 03/22/19 at 11:00 AM EDT by telephone and verified that I am speaking with the correct person using two identifiers.   I discussed the limitations, risks, security and privacy concerns of performing an evaluation and management service by telephone and the availability of in person appointments. I also discussed with the patient that there may be a patient responsible charge related to this service. The patient expressed understanding and agreed to proceed.  Location patient: home Location provider: work office Participants present for the call: patient, provider Patient did not have a visit in the prior 7 days to address this/these issue(s).   History of Present Illness: Last visit was 12/16/18 and we discussed hospital visit for left arm pain - at that time had cardiac eval, but with repeat labwork at office visit was found to have elevated liver function testing as well as renal function. Repeat of bloodwork in July showed significant improvement in both liver and kidney function.   States today that she is great. Sleeps all night and doesn't move (8 hours). Stays sore all day. Usually wakes up once to urinate during that time. Does use the trazodone for sleep.   Hasn't had any tylenol or crestor since liver enzymes were elevated. Also has been avoiding darker drinks. This has worked on stools. Stools had been very yellow when she was in the hospital. Bowels are still light yellow in color. Passing stools easily. BM at least daily. Urinating ok as well.   HTN: losartan-hctz RA:XENMMHW 10mg  has been on hold since liver enzyme elevation.  Arthritis:bothering her most in knees and back right now. Missing tylenol which she stopped because of elevated liver enzmes.  GERD: on nexium; working well for her.  Seasonal allergies: claritin is working ok for her. Sometimes when air too cold gets more dripping nose. Once in awhile  uses cream which helps. bactroban ointment helps a lot when just occasional flare occurs.  Insomnia: trazodone 50mg  qhs prn    Observations/Objective: Patient sounds cheerful and well on the phone. I do not appreciate any SOB. Speech and thought processing are grossly intact. Patient reported vitals:  Assessment and Plan: 1. Essential hypertension Continue with losartan-hctz.  - CBC with Differential/Platelet; Future - Comprehensive metabolic panel; Future  2. Primary osteoarthritis involving multiple joints Ok to restart tylenol if needed.   3. Dyslipidemia We will discuss adding back in crestor pending bloodwork. Will need refill if restarting.  - Lipid panel; Future  4. Elevated liver enzymes - Gamma GT; Future  5. Gastroesophageal reflux disease, esophagitis presence not specified nexium stable.  6. Nasal drainage - mupirocin ointment (BACTROBAN) 2 %; Place 1 application into the nose 2 (two) times daily.  Dispense: 22 g; Refill: 0  7. Urinary frequency - Urinalysis  8. Insomnia:  Trazodone works well. We discussed trying 1/2 tab since she is "sleeping like rock" and waking up stiff/sore from not moving. Hopefully lighter dose will keep her in slightly lighter sleep. If this does not work we can discuss alternatives.    Follow Up Instructions: Return for pending bloodwork.    [id number] [id number] [id number] 11-20 [id number] 21-30 I did not refer this patient for an OV in the next 24 hours for this/these issue(s).  I discussed the assessment and treatment plan with the patient. The patient was provided an opportunity to ask questions and all were answered. The patient agreed with the plan and demonstrated an understanding  of the instructions.   The patient was advised to call back or seek an in-person evaluation if the symptoms worsen or if the condition fails to improve as anticipated.  I provided 25 minutes of non-face-to-face time during this encounter.   Micheline Rough, MD

## 2019-03-23 ENCOUNTER — Other Ambulatory Visit (INDEPENDENT_AMBULATORY_CARE_PROVIDER_SITE_OTHER): Payer: Medicare Other

## 2019-03-23 DIAGNOSIS — R748 Abnormal levels of other serum enzymes: Secondary | ICD-10-CM

## 2019-03-23 DIAGNOSIS — E785 Hyperlipidemia, unspecified: Secondary | ICD-10-CM | POA: Diagnosis not present

## 2019-03-23 DIAGNOSIS — Z23 Encounter for immunization: Secondary | ICD-10-CM | POA: Diagnosis not present

## 2019-03-23 DIAGNOSIS — I10 Essential (primary) hypertension: Secondary | ICD-10-CM

## 2019-03-23 LAB — COMPREHENSIVE METABOLIC PANEL
ALT: 12 U/L (ref 0–35)
AST: 17 U/L (ref 0–37)
Albumin: 4 g/dL (ref 3.5–5.2)
Alkaline Phosphatase: 126 U/L — ABNORMAL HIGH (ref 39–117)
BUN: 24 mg/dL — ABNORMAL HIGH (ref 6–23)
CO2: 31 mEq/L (ref 19–32)
Calcium: 9.6 mg/dL (ref 8.4–10.5)
Chloride: 104 mEq/L (ref 96–112)
Creatinine, Ser: 1.35 mg/dL — ABNORMAL HIGH (ref 0.40–1.20)
GFR: 36.82 mL/min — ABNORMAL LOW (ref >60.00)
Glucose, Bld: 83 mg/dL (ref 70–99)
Potassium: 4.3 mEq/L (ref 3.5–5.1)
Sodium: 144 mEq/L (ref 135–145)
Total Bilirubin: 0.8 mg/dL (ref 0.2–1.2)
Total Protein: 6.4 g/dL (ref 6.0–8.3)

## 2019-03-23 LAB — URINALYSIS, ROUTINE W REFLEX MICROSCOPIC
Ketones, ur: NEGATIVE
Nitrite: NEGATIVE
Specific Gravity, Urine: >=1.03 — AB (ref 1.000–1.030)
Total Protein, Urine: >=300 — AB
Urine Glucose: NEGATIVE
Urobilinogen, UA: 0.2 (ref 0.0–1.0)
pH: 5 (ref 5.0–8.0)

## 2019-03-23 LAB — CBC WITH DIFFERENTIAL/PLATELET
Basophils Absolute: 0 10*3/uL (ref 0.0–0.1)
Basophils Relative: 0.6 % (ref 0.0–3.0)
Eosinophils Absolute: 0 10*3/uL (ref 0.0–0.7)
Eosinophils Relative: 1.3 % (ref 0.0–5.0)
HCT: 42.4 % (ref 36.0–46.0)
Hemoglobin: 14 g/dL (ref 12.0–15.0)
Lymphocytes Relative: 24.2 % (ref 12.0–46.0)
Lymphs Abs: 0.9 10*3/uL (ref 0.7–4.0)
MCHC: 33.1 g/dL (ref 30.0–36.0)
MCV: 97.6 fl (ref 78.0–100.0)
Monocytes Absolute: 0.3 10*3/uL (ref 0.1–1.0)
Monocytes Relative: 9 % (ref 3.0–12.0)
Neutro Abs: 2.5 10*3/uL (ref 1.4–7.7)
Neutrophils Relative %: 64.9 % (ref 43.0–77.0)
Platelets: 174 10*3/uL (ref 150.0–400.0)
RBC: 4.34 Mil/uL (ref 3.87–5.11)
RDW: 12.8 % (ref 11.5–15.5)
WBC: 3.8 10*3/uL — ABNORMAL LOW (ref 4.0–10.5)

## 2019-03-23 LAB — LIPID PANEL
Cholesterol: 241 mg/dL — ABNORMAL HIGH (ref 0–200)
HDL: 59.5 mg/dL (ref >39.00)
LDL Cholesterol: 150 mg/dL — ABNORMAL HIGH (ref 0–99)
NonHDL: 181
Total CHOL/HDL Ratio: 4
Triglycerides: 154 mg/dL — ABNORMAL HIGH (ref 0.0–149.0)
VLDL: 30.8 mg/dL (ref 0.0–40.0)

## 2019-03-23 LAB — GAMMA GT: GGT: 14 U/L (ref 7–51)

## 2019-03-25 ENCOUNTER — Telehealth: Payer: Self-pay | Admitting: *Deleted

## 2019-03-25 NOTE — Telephone Encounter (Signed)
Lab appt entered for 1:40pm.

## 2019-03-25 NOTE — Addendum Note (Signed)
Addended by: Agnes Lawrence on: 03/25/2019 10:08 AM   Modules accepted: Orders

## 2019-03-25 NOTE — Addendum Note (Signed)
Addended by: Agnes Lawrence on: 03/25/2019 08:46 AM   Modules accepted: Orders

## 2019-03-25 NOTE — Telephone Encounter (Signed)
Copied from [place] [id number]. Topic: General - Other >> Mar 25, 2019  1:36 PM Carolyn Stare wrote: Pt wanted to let Arville Go know she will come in tomorrow around 1;30 to redo the specimen

## 2019-03-26 ENCOUNTER — Other Ambulatory Visit: Payer: Medicare Other

## 2019-03-26 ENCOUNTER — Other Ambulatory Visit: Payer: Self-pay

## 2019-03-26 DIAGNOSIS — R350 Frequency of micturition: Secondary | ICD-10-CM | POA: Diagnosis not present

## 2019-03-27 LAB — URINE CULTURE
MICRO NUMBER:: 898049
SPECIMEN QUALITY:: ADEQUATE

## 2019-03-30 ENCOUNTER — Telehealth: Payer: Self-pay | Admitting: *Deleted

## 2019-03-30 NOTE — Telephone Encounter (Signed)
Spoke with the pt and she stated she received the flu shot here a few days ago.  Follow up appt scheduled in 6 months.

## 2019-03-30 NOTE — Telephone Encounter (Signed)
-----   Message from Caren Macadam, MD sent at 03/22/2019 11:30 AM EDT ----- Please set up lab visit along with nurse visit for flu shot in near future.

## 2019-03-31 DIAGNOSIS — H52203 Unspecified astigmatism, bilateral: Secondary | ICD-10-CM | POA: Diagnosis not present

## 2019-03-31 DIAGNOSIS — Z961 Presence of intraocular lens: Secondary | ICD-10-CM | POA: Diagnosis not present

## 2019-05-10 ENCOUNTER — Telehealth: Payer: Self-pay | Admitting: *Deleted

## 2019-05-10 NOTE — Telephone Encounter (Signed)
Patient called after hours line yesterday evening. Patient reports her urine was dark red. States there was blood in it. Onset yesterday with just a little blood, today she also has urgency and urine is dark red. No fever, no pain during urination, but after going she is now beginning to burn.

## 2019-05-10 NOTE — Telephone Encounter (Signed)
Looks like she has visit tomorrow with Tommi Rumps which is good. Let us know if she needs anything else in meanwhile, but he should be able to eval in office and get things figured out for her. Looks like other symptoms are stable, which is reassuring.

## 2019-05-10 NOTE — Telephone Encounter (Signed)
I called the pt and informed her of the message below

## 2019-05-11 ENCOUNTER — Other Ambulatory Visit: Payer: Self-pay

## 2019-05-11 ENCOUNTER — Encounter: Payer: Self-pay | Admitting: Adult Health

## 2019-05-11 ENCOUNTER — Ambulatory Visit (INDEPENDENT_AMBULATORY_CARE_PROVIDER_SITE_OTHER): Payer: Medicare Other | Admitting: Adult Health

## 2019-05-11 VITALS — BP 140/98 | HR 100 | Temp 97.6°F | Wt 157.2 lb

## 2019-05-11 DIAGNOSIS — R300 Dysuria: Secondary | ICD-10-CM | POA: Diagnosis not present

## 2019-05-11 LAB — POCT URINALYSIS DIPSTICK
Glucose, UA: NEGATIVE
Leukocytes, UA: NEGATIVE
Protein, UA: POSITIVE — AB
Spec Grav, UA: >=1.03 — AB (ref 1.010–1.025)
Urobilinogen, UA: 0.2 E.U./dL
pH, UA: 5.5 (ref 5.0–8.0)

## 2019-05-11 MED ORDER — CIPROFLOXACIN HCL 500 MG PO TABS: 500.0000 mg | ORAL_TABLET | Freq: Two times a day (BID) | ORAL | 0 refills | Status: DC

## 2019-05-11 NOTE — Progress Notes (Signed)
Subjective:    Patient ID: Shelley Robinson, female    DOB: Aug 22, 1926, 85 y.o.   MRN: [id number]  HPI  85 year old female who  has a past medical history of Anxiety, Arthritis, GERD (gastroesophageal reflux disease), Hyperlipidemia, Hypertension, and Osteopenia.   She presents to the office today for concern of UTI. Her symptoms started two days ago with blood in her urine ( has not had any since), burning at the end of her stream, dribbling and not feeling as though she is emptying her bladder completely.   She denies fevers, chills, nausea, vomiting, diarrhea, abd pain or back pain.    Review of Systems See HPI   Past Medical History:  Diagnosis Date  . Anxiety   . Arthritis   . GERD (gastroesophageal reflux disease)   . Hyperlipidemia   . Hypertension   . Osteopenia     Social History   Socioeconomic History  . Marital status: Widowed    Spouse name: Not on file  . Number of children: Not on file  . Years of education: Not on file  . Highest education level: Not on file  Occupational History  . Not on file  Social Needs  . Financial resource strain: Not on file  . Food insecurity    Worry: Not on file    Inability: Not on file  . Transportation needs    Medical: Not on file    Non-medical: Not on file  Tobacco Use  . Smoking status: Former Smoker    Quit date: 07/08/1978    Years since quitting: 40.8  . Smokeless tobacco: Never Used  Substance and Sexual Activity  . Alcohol use: Yes    Alcohol/week: 1.0 standard drinks    Types: 1 Glasses of wine per week    Comment: once or twice a month  . Drug use: No  . Sexual activity: Never    Partners: Male    Birth control/protection: Post-menopausal  Lifestyle  . Physical activity    Days per week: Not on file    Minutes per session: Not on file  . Stress: Not on file  Relationships  . Social Herbalist on phone: Not on file    Gets together: Not on file    Attends religious service: Not on  file    Active member of club or organization: Not on file    Attends meetings of clubs or organizations: Not on file    Relationship status: Not on file  . Intimate partner violence    Fear of current or ex partner: Not on file    Emotionally abused: Not on file    Physically abused: Not on file    Forced sexual activity: Not on file  Other Topics Concern  . Not on file  Social History Narrative  . Not on file    Past Surgical History:  Procedure Laterality Date  . ABDOMINAL HYSTERECTOMY     fibroid tumors  . CHOLECYSTECTOMY    . JOINT REPLACEMENT     left shoulder  . TONSILLECTOMY      Family History  Problem Relation Age of Onset  . Alzheimer's disease Mother   . Heart disease Father   . Cancer Sister   . Lymphoma Sister     Allergies  Allergen Reactions  . Codeine Other (See Comments)  . Metaxalone Other (See Comments)  . Naproxen Other (See Comments)  . Lisinopril Cough  . Piroxicam Other (See  Comments)    unknown  . Prednisone Other (See Comments)    hypertensive  . Sulfa Antibiotics     Reaction unknown  . Penicillins Rash    DID THE REACTION INVOLVE: Swelling of the face/tongue/throat, SOB, or low BP? Skin Rash Sudden or severe rash/hives, skin peeling, or the inside of the mouth or nose? Y Did it require medical treatment? N When did it last happen?Over 46 Years Ago. If all above answers are "NO", may proceed with cephalosporin use.     Current Outpatient Medications on File Prior to Visit  Medication Sig Dispense Refill  . acetaminophen (TYLENOL) 500 MG tablet Take 1 tablet (500 mg total) by mouth every 6 (six) hours as needed for pain. Take one  in AM and one at qhs, and one at noon if needed (Patient taking differently: Take 500 mg by mouth every 8 (eight) hours as needed for moderate pain. Take one  in AM and one at qhs, and one at noon if needed) 30 tablet 0  . diclofenac sodium (VOLTAREN) 1 % GEL Apply 4 g topically 4 (four) times daily.  (Patient taking differently: Apply 4 g topically 4 (four) times daily as needed (pain). ) 4 Tube 3  . esomeprazole (NEXIUM) 40 MG capsule Take 1 capsule (40 mg total) by mouth daily before supper. 90 capsule 3  . loratadine (CLARITIN) 10 MG tablet Take 10 mg by mouth daily. Not sure of the mg/lc    . losartan-hydrochlorothiazide (HYZAAR) 100-12.5 MG tablet Take 1 tablet by mouth daily. 90 tablet 3  . Multiple Vitamins-Minerals (MULTIVITAMIN ADULT) CHEW Chew 1 tablet by mouth daily.    . Multiple Vitamins-Minerals (PRESERVISION AREDS 2 PO) Take 2 tablets by mouth daily.     . mupirocin ointment (BACTROBAN) 2 % Place 1 application into the nose 2 (two) times daily. 22 g 0  . traZODone (DESYREL) 50 MG tablet 1 tablet at bedtime as needed for sleep 90 tablet 2  . SHINGRIX injection LOT                     RA/LA                   EXP                    MANU  1   No current facility-administered medications on file prior to visit.     BP (!) 140/98 (BP Location: Right Arm, Patient Position: Sitting, Cuff Size: Normal)   Pulse 100   Temp 97.6 F (36.4 C) (Temporal)   Wt 157 lb 3.2 oz (71.3 kg)   SpO2 98%   BMI 30.70 kg/m       Objective:   Physical Exam Vitals signs and nursing note reviewed.  Constitutional:      Appearance: Normal appearance.  Cardiovascular:     Rate and Rhythm: Normal rate and regular rhythm.     Pulses: Normal pulses.     Heart sounds: Normal heart sounds.  Pulmonary:     Effort: Pulmonary effort is normal.     Breath sounds: Normal breath sounds.  Abdominal:     General: Abdomen is flat.     Palpations: Abdomen is soft.  Skin:    General: Skin is warm and dry.  Neurological:     Mental Status: She is alert.  Psychiatric:        Mood and Affect: Mood normal.  Behavior: Behavior normal.        Thought Content: Thought content normal.        Judgment: Judgment normal.       Assessment & Plan:  1. Burning with urination - Allergic to sulfa and  PCN - POCT urinalysis dipstick + blood and protein. Will treat due to symptoms and send culture.  - Urine Culture - ciprofloxacin (CIPRO) 500 MG tablet; Take 1 tablet (500 mg total) by mouth 2 (two) times daily.  Dispense: 6 tablet; Refill: 0 - Drink plenty of water throughout the day  - Follow up if symptoms worsen   Dorothyann Peng, NP

## 2019-05-13 LAB — URINE CULTURE
MICRO NUMBER:: 1060064
SPECIMEN QUALITY:: ADEQUATE

## 2019-08-24 ENCOUNTER — Other Ambulatory Visit: Payer: Self-pay | Admitting: Family Medicine

## 2019-09-27 ENCOUNTER — Ambulatory Visit: Payer: Medicare Other | Admitting: Family Medicine

## 2019-09-29 DIAGNOSIS — H353233 Exudative age-related macular degeneration, bilateral, with inactive scar: Secondary | ICD-10-CM | POA: Diagnosis not present

## 2019-10-08 ENCOUNTER — Telehealth: Payer: Self-pay | Admitting: Family Medicine

## 2019-10-08 NOTE — Telephone Encounter (Signed)
Received call from call service requesting to schedule the patient for Saturday clinic for a possible UTI. She had a small amount of pink fluid on the toilet paper today. No fevers. Feels well otherwise. The RN outcome was to see a provider tomorrow. I gave the number to call at my office to schedule and advised that I would also have someone reach out to the patient tomorrow to set up the appointment.

## 2019-10-09 NOTE — Telephone Encounter (Signed)
Unable to leave message for patient to return call back. Phone kept ringing. If patient returns call please place on Saturday clnic with Dr Caryl Bis for a virtual appointment.

## 2019-10-09 NOTE — Telephone Encounter (Signed)
Patient stated she is alright today and doesn't wish to have virtual appointment today. She stated she will wait until she sees PCP on the 12.

## 2019-10-12 ENCOUNTER — Telehealth (INDEPENDENT_AMBULATORY_CARE_PROVIDER_SITE_OTHER): Payer: Medicare Other | Admitting: Family Medicine

## 2019-10-12 ENCOUNTER — Other Ambulatory Visit: Payer: Self-pay

## 2019-10-12 DIAGNOSIS — R300 Dysuria: Secondary | ICD-10-CM

## 2019-10-12 MED ORDER — NITROFURANTOIN MONOHYD MACRO 100 MG PO CAPS: 100.0000 mg | ORAL_CAPSULE | Freq: Two times a day (BID) | ORAL | 0 refills | Status: DC

## 2019-10-12 NOTE — Progress Notes (Signed)
Virtual Visit via Telephone Note  I connected with Shelley Robinson on 10/12/19 at  1:20 PM EDT by telephone and verified that I am speaking with the correct person using two identifiers.   I discussed the limitations, risks, security and privacy concerns of performing an evaluation and management service by telephone and the availability of in person appointments. I also discussed with the patient that there may be a patient responsible charge related to this service. The patient expressed understanding and agreed to proceed.  Location patient: home Location provider: work or home office Participants present for the call: patient, provider, caretaker Patient did not have a visit in the prior 7 days to address this/these issue(s).   History of Present Illness:  Acute visit for Dysuria and Hematuria: -started a few days ago -symptoms include frequency, urgency, hematuria once and some urinary odor and intermittent discolored urine -denies: fevers, chills, abd/pelvic/flank pain, vaginal bleeding or bleeding elsewhere -has history of UTIs a number of times in the past, none recently in the last few months   Observations/Objective: Patient sounds cheerful and well on the phone. I do not appreciate any SOB. Speech and thought processing are grossly intact. Patient reported vitals:  Assessment and Plan:  Dysuria  -we discussed possible serious and likely etiologies, options for evaluation and workup, limitations of telemedicine visit vs in person visit, treatment, treatment risks and precautions. Pt prefers to treat via telemedicine empirically rather then risking or undertaking an in person visit at this moment. Suspect possible UTI and she prefers to try empiric treatment with Macrobid 100mg  bid x 7 days prior to an inperson evaluation. Patient agrees to seek prompt in person care if worsening, new symptoms arise, or if is not improving with treatment. She has follow up with her PCP on the  12th per her report and advised to request udip then to ensure hematuria resolved even if symptoms resolve with treatment.  Follow Up Instructions:   I did not refer this patient for an OV in the next 24 hours for this/these issue(s).  I discussed the assessment and treatment plan with the patient. The patient was provided an opportunity to ask questions and all were answered. The patient agreed with the plan and demonstrated an understanding of the instructions.   The patient was advised to call back or seek an in-person evaluation if the symptoms worsen or if the condition fails to improve as anticipated.  I provided 15 minutes of non-face-to-face time during this encounter.   Lucretia Kern, DO

## 2019-10-18 ENCOUNTER — Telehealth: Payer: Self-pay | Admitting: Family Medicine

## 2019-10-18 ENCOUNTER — Encounter: Payer: Self-pay | Admitting: Family Medicine

## 2019-10-18 ENCOUNTER — Ambulatory Visit (INDEPENDENT_AMBULATORY_CARE_PROVIDER_SITE_OTHER): Payer: Medicare Other | Admitting: Family Medicine

## 2019-10-18 ENCOUNTER — Other Ambulatory Visit: Payer: Self-pay

## 2019-10-18 VITALS — BP 130/70 | HR 92 | Temp 98.0°F | Ht 60.0 in | Wt 149.3 lb

## 2019-10-18 DIAGNOSIS — N368 Other specified disorders of urethra: Secondary | ICD-10-CM

## 2019-10-18 DIAGNOSIS — M159 Polyosteoarthritis, unspecified: Secondary | ICD-10-CM

## 2019-10-18 DIAGNOSIS — M8949 Other hypertrophic osteoarthropathy, multiple sites: Secondary | ICD-10-CM | POA: Diagnosis not present

## 2019-10-18 DIAGNOSIS — E785 Hyperlipidemia, unspecified: Secondary | ICD-10-CM | POA: Diagnosis not present

## 2019-10-18 DIAGNOSIS — I10 Essential (primary) hypertension: Secondary | ICD-10-CM

## 2019-10-18 DIAGNOSIS — K219 Gastro-esophageal reflux disease without esophagitis: Secondary | ICD-10-CM

## 2019-10-18 DIAGNOSIS — J3489 Other specified disorders of nose and nasal sinuses: Secondary | ICD-10-CM

## 2019-10-18 LAB — COMPREHENSIVE METABOLIC PANEL
ALT: 17 U/L (ref 0–35)
AST: 19 U/L (ref 0–37)
Albumin: 3.8 g/dL (ref 3.5–5.2)
Alkaline Phosphatase: 139 U/L — ABNORMAL HIGH (ref 39–117)
BUN: 24 mg/dL — ABNORMAL HIGH (ref 6–23)
CO2: 29 mEq/L (ref 19–32)
Calcium: 9.1 mg/dL (ref 8.4–10.5)
Chloride: 104 mEq/L (ref 96–112)
Creatinine, Ser: 1.25 mg/dL — ABNORMAL HIGH (ref 0.40–1.20)
GFR: 40.18 mL/min — ABNORMAL LOW (ref >60.00)
Glucose, Bld: 103 mg/dL — ABNORMAL HIGH (ref 70–99)
Potassium: 4.1 mEq/L (ref 3.5–5.1)
Sodium: 142 mEq/L (ref 135–145)
Total Bilirubin: 0.5 mg/dL (ref 0.2–1.2)
Total Protein: 6.1 g/dL (ref 6.0–8.3)

## 2019-10-18 LAB — LIPID PANEL
Cholesterol: 185 mg/dL (ref 0–200)
HDL: 57.7 mg/dL (ref >39.00)
LDL Cholesterol: 94 mg/dL (ref 0–99)
NonHDL: 127.73
Total CHOL/HDL Ratio: 3
Triglycerides: 169 mg/dL — ABNORMAL HIGH (ref 0.0–149.0)
VLDL: 33.8 mg/dL (ref 0.0–40.0)

## 2019-10-18 LAB — CBC WITH DIFFERENTIAL/PLATELET
Basophils Absolute: 0 10*3/uL (ref 0.0–0.1)
Basophils Relative: 0.5 % (ref 0.0–3.0)
Eosinophils Absolute: 0.1 10*3/uL (ref 0.0–0.7)
Eosinophils Relative: 1.1 % (ref 0.0–5.0)
HCT: 40.6 % (ref 36.0–46.0)
Hemoglobin: 13.4 g/dL (ref 12.0–15.0)
Lymphocytes Relative: 17.9 % (ref 12.0–46.0)
Lymphs Abs: 0.9 10*3/uL (ref 0.7–4.0)
MCHC: 33.1 g/dL (ref 30.0–36.0)
MCV: 97.6 fl (ref 78.0–100.0)
Monocytes Absolute: 0.5 10*3/uL (ref 0.1–1.0)
Monocytes Relative: 9.6 % (ref 3.0–12.0)
Neutro Abs: 3.5 10*3/uL (ref 1.4–7.7)
Neutrophils Relative %: 70.9 % (ref 43.0–77.0)
Platelets: 167 10*3/uL (ref 150.0–400.0)
RBC: 4.16 Mil/uL (ref 3.87–5.11)
RDW: 13.2 % (ref 11.5–15.5)
WBC: 4.9 10*3/uL (ref 4.0–10.5)

## 2019-10-18 LAB — TSH: TSH: 3.77 u[IU]/mL (ref 0.35–4.50)

## 2019-10-18 MED ORDER — IPRATROPIUM BROMIDE 0.06 % NA SOLN: 2.0000 | Freq: Four times a day (QID) | NASAL | 12 refills | Status: AC

## 2019-10-18 MED ORDER — PREMARIN 0.625 MG/GM VA CREA: TOPICAL_CREAM | VAGINAL | 5 refills | Status: DC

## 2019-10-18 NOTE — Progress Notes (Signed)
Shelley Robinson DOB: 03-05-1927 Encounter date: 10/18/2019  This is a 86 y.o. female who presents with Chief Complaint  Patient presents with  . Follow-up    History of present illness: Had another episode of blood - feels like it came from vagina. Just bleeds a few seconds. Wonders what is causing this. When it first came just had pad on; dark red. This is the second episode.First time was in shower; second was on commode. Not painful, just sensation. Had some burning a couple of days before episode where she urinates.   Had recent visits for uti: didn't really note difference with medication.  HTN: losartan-hctz FU:XNATFTD 10mg  has been on hold since liver enzyme elevation which resolved on last bloodwork 03/2019. Arthritis:bothering her most in knees and back right now. GERD: on nexium; working well for her.  Seasonal allergies: claritin is working ok for her. Sometimes when air too cold gets more dripping nose.   Allergies  Allergen Reactions  . Codeine Other (See Comments)  . Metaxalone Other (See Comments)  . Naproxen Other (See Comments)  . Lisinopril Cough  . Piroxicam Other (See Comments)    unknown  . Prednisone Other (See Comments)    hypertensive  . Sulfa Antibiotics     Reaction unknown  . Penicillins Rash    DID THE REACTION INVOLVE: Swelling of the face/tongue/throat, SOB, or low BP? Skin Rash Sudden or severe rash/hives, skin peeling, or the inside of the mouth or nose? Y Did it require medical treatment? N When did it last happen?Over 22 Years Ago. If all above answers are "NO", may proceed with cephalosporin use.    Current Meds  Medication Sig  . acetaminophen (TYLENOL) 500 MG tablet Take 1 tablet (500 mg total) by mouth every 6 (six) hours as needed for pain. Take one  in AM and one at qhs, and one at noon if needed (Patient taking differently: Take 500 mg by mouth every 8 (eight) hours as needed for moderate pain. Take one  in AM and one at qhs, and  one at noon if needed)  . ciprofloxacin (CIPRO) 500 MG tablet Take 1 tablet (500 mg total) by mouth 2 (two) times daily.  . diclofenac sodium (VOLTAREN) 1 % GEL Apply 4 g topically 4 (four) times daily. (Patient taking differently: Apply 4 g topically 4 (four) times daily as needed (pain). )  . esomeprazole (NEXIUM) 40 MG capsule Take 1 capsule (40 mg total) by mouth daily before supper.  . loratadine (CLARITIN) 10 MG tablet Take 10 mg by mouth daily. Not sure of the mg/lc  . losartan-hydrochlorothiazide (HYZAAR) 100-12.5 MG tablet Take 1 tablet by mouth daily.  . Multiple Vitamins-Minerals (MULTIVITAMIN ADULT) CHEW Chew 1 tablet by mouth daily.  . Multiple Vitamins-Minerals (PRESERVISION AREDS 2 PO) Take 2 tablets by mouth daily.   . mupirocin ointment (BACTROBAN) 2 % Place 1 application into the nose 2 (two) times daily.  . nitrofurantoin, macrocrystal-monohydrate, (MACROBID) 100 MG capsule Take 1 capsule (100 mg total) by mouth 2 (two) times daily.  Marland Kitchen SHINGRIX injection LOT                     RA/LA                   EXP                    MANU  . traZODone (DESYREL) 50 MG tablet TAKE ONE TABLET BY MOUTH  EVERY NIGHT AT BEDTIME AS NEEDED FOR SLEEP    Review of Systems  Constitutional: Negative for chills, fatigue and fever.  Respiratory: Negative for cough, chest tightness, shortness of breath and wheezing.   Cardiovascular: Negative for chest pain, palpitations and leg swelling.  Genitourinary: Positive for dysuria and vaginal bleeding. Negative for vaginal discharge and vaginal pain.    Objective:  BP 130/70 (BP Location: Right Arm, Patient Position: Sitting, Cuff Size: Normal)   Pulse 92   Temp 98 F (36.7 C) (Temporal)   Ht 5' (1.524 m)   Wt 149 lb 4.8 oz (67.7 kg)   SpO2 95%   BMI 29.16 kg/m   Weight: 149 lb 4.8 oz (67.7 kg)   BP Readings from Last 3 Encounters:  10/18/19 130/70  05/11/19 (!) 140/98  09/16/18 120/70   Wt Readings from Last 3 Encounters:  10/18/19 149 lb  4.8 oz (67.7 kg)  05/11/19 157 lb 3.2 oz (71.3 kg)  03/22/19 159 lb 6.4 oz (72.3 kg)    Physical Exam Constitutional:      General: She is not in acute distress.    Appearance: She is well-developed.  Cardiovascular:     Rate and Rhythm: Normal rate and regular rhythm.     Heart sounds: Normal heart sounds. No murmur. No friction rub.  Pulmonary:     Effort: Pulmonary effort is normal. No respiratory distress.     Breath sounds: Normal breath sounds. No wheezing or rales.  Genitourinary:    Labia:        Right: No rash, tenderness or lesion.        Left: No rash, tenderness or lesion.      Urethra: Prolapse present.  Musculoskeletal:     Right lower leg: No edema.     Left lower leg: No edema.  Neurological:     Mental Status: She is alert and oriented to person, place, and time.  Psychiatric:        Behavior: Behavior normal.     Assessment/Plan  1. Essential hypertension Well controlled; continue current medication - CBC with Differential/Platelet; Future - Comprehensive metabolic panel; Future - CBC with Differential/Platelet - Comprehensive metabolic panel  2. Gastroesophageal reflux disease, unspecified whether esophagitis present Well controlled. Continue with nexium  3. Primary osteoarthritis involving multiple joints Stable; takes time with getting around, uses walker.  4. Dyslipidemia Recheck for baseline. Diet controlled currently. - Lipid panel; Future - TSH; Future - Lipid panel - TSH  5. Urethral prolapse Discussed this condition; will bring her back for recheck in a month; let me know sooner if any concerns/problems with treatment. - conjugated estrogens (PREMARIN) vaginal cream; Apply to vaginal area twice daily x 2 weeks, then daily x 2 weeks  Dispense: 42.5 g; Refill: 5  6. Rhinorrhea Has used this from a friend and found very helpful. Personal rx written today. - ipratropium (ATROVENT) 0.06 % nasal spray; Place 2 sprays into both nostrils 4  (four) times daily.  Dispense: 15 mL; Refill: 12    Return in about 1 month (around 11/17/2019) for Chronic condition visit.    Micheline Rough, MD

## 2019-10-18 NOTE — Telephone Encounter (Signed)
Sent a Rx Premarin to Shelley Robinson is requires [place] $350  She can get the eftradiol vaginal cream for $35 without the insurance  Patient needs this Rx sent to  [place], [place] Phone:  [phone]  Fax:  [phone]

## 2019-10-19 MED ORDER — ESTRADIOL 0.1 MG/GM VA CREA: 1.0000 | TOPICAL_CREAM | Freq: Every day | VAGINAL | 0 refills | Status: AC

## 2019-10-19 NOTE — Addendum Note (Signed)
Addended by: Agnes Lawrence on: 10/19/2019 01:45 PM   Modules accepted: Orders

## 2019-10-19 NOTE — Telephone Encounter (Signed)
Rx done. 

## 2019-10-19 NOTE — Telephone Encounter (Signed)
Ok to change rx and resend

## 2019-10-28 ENCOUNTER — Other Ambulatory Visit: Payer: Self-pay | Admitting: Family Medicine

## 2019-11-09 ENCOUNTER — Encounter (HOSPITAL_COMMUNITY): Payer: Self-pay | Admitting: Emergency Medicine

## 2019-11-09 ENCOUNTER — Emergency Department (HOSPITAL_COMMUNITY): Payer: Medicare Other

## 2019-11-09 ENCOUNTER — Emergency Department (HOSPITAL_COMMUNITY)
Admission: EM | Admit: 2019-11-09 | Discharge: 2019-11-09 | Disposition: A | Discharge status: Home / Self Care | Payer: Medicare Other | Attending: Emergency Medicine | Admitting: Emergency Medicine

## 2019-11-09 DIAGNOSIS — S2232XA Fracture of one rib, left side, initial encounter for closed fracture: Secondary | ICD-10-CM | POA: Diagnosis not present

## 2019-11-09 DIAGNOSIS — Y9301 Activity, walking, marching and hiking: Secondary | ICD-10-CM | POA: Diagnosis not present

## 2019-11-09 DIAGNOSIS — R519 Headache, unspecified: Secondary | ICD-10-CM | POA: Insufficient documentation

## 2019-11-09 DIAGNOSIS — S42292A Other displaced fracture of upper end of left humerus, initial encounter for closed fracture: Secondary | ICD-10-CM | POA: Diagnosis not present

## 2019-11-09 DIAGNOSIS — S0083XA Contusion of other part of head, initial encounter: Secondary | ICD-10-CM | POA: Diagnosis not present

## 2019-11-09 DIAGNOSIS — S0990XA Unspecified injury of head, initial encounter: Secondary | ICD-10-CM | POA: Diagnosis not present

## 2019-11-09 DIAGNOSIS — S199XXA Unspecified injury of neck, initial encounter: Secondary | ICD-10-CM | POA: Diagnosis not present

## 2019-11-09 DIAGNOSIS — T07XXXA Unspecified multiple injuries, initial encounter: Secondary | ICD-10-CM | POA: Diagnosis present

## 2019-11-09 DIAGNOSIS — M546 Pain in thoracic spine: Secondary | ICD-10-CM | POA: Diagnosis not present

## 2019-11-09 DIAGNOSIS — Z87891 Personal history of nicotine dependence: Secondary | ICD-10-CM | POA: Insufficient documentation

## 2019-11-09 DIAGNOSIS — Z23 Encounter for immunization: Secondary | ICD-10-CM | POA: Diagnosis not present

## 2019-11-09 DIAGNOSIS — M25531 Pain in right wrist: Secondary | ICD-10-CM | POA: Insufficient documentation

## 2019-11-09 DIAGNOSIS — S40212A Abrasion of left shoulder, initial encounter: Secondary | ICD-10-CM | POA: Insufficient documentation

## 2019-11-09 DIAGNOSIS — S6992XA Unspecified injury of left wrist, hand and finger(s), initial encounter: Secondary | ICD-10-CM | POA: Diagnosis not present

## 2019-11-09 DIAGNOSIS — R000 Tachycardia, unspecified: Secondary | ICD-10-CM | POA: Diagnosis not present

## 2019-11-09 DIAGNOSIS — M25562 Pain in left knee: Secondary | ICD-10-CM | POA: Diagnosis not present

## 2019-11-09 DIAGNOSIS — W101XXA Fall (on)(from) sidewalk curb, initial encounter: Secondary | ICD-10-CM | POA: Insufficient documentation

## 2019-11-09 DIAGNOSIS — M25532 Pain in left wrist: Secondary | ICD-10-CM | POA: Diagnosis not present

## 2019-11-09 DIAGNOSIS — R0902 Hypoxemia: Secondary | ICD-10-CM | POA: Diagnosis not present

## 2019-11-09 DIAGNOSIS — S6991XA Unspecified injury of right wrist, hand and finger(s), initial encounter: Secondary | ICD-10-CM | POA: Diagnosis not present

## 2019-11-09 DIAGNOSIS — Z79899 Other long term (current) drug therapy: Secondary | ICD-10-CM | POA: Diagnosis not present

## 2019-11-09 DIAGNOSIS — Y999 Unspecified external cause status: Secondary | ICD-10-CM | POA: Insufficient documentation

## 2019-11-09 DIAGNOSIS — J80 Acute respiratory distress syndrome: Secondary | ICD-10-CM | POA: Diagnosis not present

## 2019-11-09 DIAGNOSIS — I10 Essential (primary) hypertension: Secondary | ICD-10-CM | POA: Diagnosis not present

## 2019-11-09 DIAGNOSIS — I491 Atrial premature depolarization: Secondary | ICD-10-CM | POA: Diagnosis not present

## 2019-11-09 DIAGNOSIS — Y92480 Sidewalk as the place of occurrence of the external cause: Secondary | ICD-10-CM | POA: Insufficient documentation

## 2019-11-09 MED ORDER — HYDROCODONE-ACETAMINOPHEN 5-325 MG PO TABS
1.0000 | ORAL_TABLET | Freq: Once | ORAL | Status: AC
  Administered 2019-11-09: 18:00:00 1 via ORAL
  Filled 2019-11-09: qty 1

## 2019-11-09 MED ORDER — BACITRACIN ZINC 500 UNIT/GM EX OINT
1.0000 "application " | TOPICAL_OINTMENT | Freq: Two times a day (BID) | CUTANEOUS | Status: DC
  Administered 2019-11-09: 1 via TOPICAL

## 2019-11-09 MED ORDER — TETANUS-DIPHTH-ACELL PERTUSSIS 5-2.5-18.5 LF-MCG/0.5 IM SUSP
0.5000 mL | Freq: Once | INTRAMUSCULAR | Status: AC
  Administered 2019-11-09: 18:00:00 0.5 mL via INTRAMUSCULAR
  Filled 2019-11-09: qty 0.5

## 2019-11-09 NOTE — ED Notes (Signed)
Pt transported to xray 

## 2019-11-09 NOTE — ED Provider Notes (Signed)
[facility] DEPARTMENT Provider Note   CSN: [id number] Arrival date & time: 11/09/19  1539     History Chief Complaint  Patient presents with  . Fall    Shelley Robinson is a 86 y.o. female.  86 year old female with past medical history below including hypertension, hyperlipidemia, GERD, osteopenia who presents with fall.  Just prior to arrival, the patient was walking outside of her house and stepped up to the sidewalk, tripped on a curb and fell, striking the left side of her forehead and her left shoulder.  She did not lose consciousness.  She reports severe pain in her left shoulder where she has had previous surgery as well as some milder pain in her left forehead.  She reports mild pain in bilateral wrists and mid thoracic back.  No chest, abdominal, hip, or lower extremity pain.  No anticoagulant use.  The history is provided by the patient.  Fall       Past Medical History:  Diagnosis Date  . Anxiety   . Arthritis   . GERD (gastroesophageal reflux disease)   . Hyperlipidemia   . Hypertension   . Osteopenia     Patient Active Problem List   Diagnosis Date Noted  . GERD (gastroesophageal reflux disease) 03/22/2019  . H/O diastolic dysfunction 05/01/8526  . Osteoarthritis 01/16/2009  . BACK PAIN, THORACIC REGION, RIGHT 02/23/2008  . DYSPNEA ON EXERTION 10/20/2007  . Dyslipidemia 01/06/2007  . Essential hypertension 01/06/2007  . LOW BACK PAIN 01/06/2007    Past Surgical History:  Procedure Laterality Date  . ABDOMINAL HYSTERECTOMY     fibroid tumors  . CHOLECYSTECTOMY    . JOINT REPLACEMENT     left shoulder  . TONSILLECTOMY       OB History    Gravida  3   Para  3   Term  3   Preterm      AB      Living  3     SAB      TAB      Ectopic      Multiple      Live Births  3           Family History  Problem Relation Age of Onset  . Alzheimer's disease Mother   . Heart disease Father   . Cancer  Sister   . Lymphoma Sister     Social History   Tobacco Use  . Smoking status: Former Smoker    Quit date: 07/08/1978    Years since quitting: 41.3  . Smokeless tobacco: Never Used  Substance Use Topics  . Alcohol use: Yes    Alcohol/week: 1.0 standard drinks    Types: 1 Glasses of wine per week    Comment: once or twice a month  . Drug use: No    Home Medications Prior to Admission medications   Medication Sig Start Date End Date Taking? Authorizing Provider  acetaminophen (TYLENOL) 500 MG tablet Take 1 tablet (500 mg total) by mouth every 6 (six) hours as needed for pain. Take one  in AM and one at qhs, and one at noon if needed Patient taking differently: Take 500 mg by mouth every 8 (eight) hours as needed for moderate pain. Take one  in AM and one at qhs, and one at noon if needed 11/03/11   Dhungel, Nishant, MD  ciprofloxacin (CIPRO) 500 MG tablet Take 1 tablet (500 mg total) by mouth 2 (two) times daily. 05/11/19  Nafziger, Tommi Rumps, NP  conjugated estrogens (PREMARIN) vaginal cream Apply to vaginal area twice daily x 2 weeks, then daily x 2 weeks 10/18/19   Caren Macadam, MD  diclofenac sodium (VOLTAREN) 1 % GEL Apply 4 g topically 4 (four) times daily. Patient taking differently: Apply 4 g topically 4 (four) times daily as needed (pain).  05/20/18   Martinique, Betty G, MD  esomeprazole (NEXIUM) 40 MG capsule Take 1 capsule (40 mg total) by mouth daily before supper. 11/23/18   Koberlein, Steele Berg, MD  estradiol (ESTRACE) 0.1 MG/GM vaginal cream Place 1 Applicatorful vaginally at bedtime. 10/19/19   Koberlein, Steele Berg, MD  ipratropium (ATROVENT) 0.06 % nasal spray Place 2 sprays into both nostrils 4 (four) times daily. 10/18/19   Caren Macadam, MD  loratadine (CLARITIN) 10 MG tablet Take 10 mg by mouth daily. Not sure of the mg/lc    [provider]  losartan-hydrochlorothiazide (HYZAAR) 100-12.5 MG tablet Take 1 tablet by mouth daily. 11/23/18   Caren Macadam, MD   Multiple Vitamins-Minerals (MULTIVITAMIN ADULT) CHEW Chew 1 tablet by mouth daily.    [provider]  Multiple Vitamins-Minerals (PRESERVISION AREDS 2 PO) Take 2 tablets by mouth daily.     [provider]  mupirocin ointment (BACTROBAN) 2 % Place 1 application into the nose 2 (two) times daily. 03/22/19   Koberlein, Steele Berg, MD  nitrofurantoin, macrocrystal-monohydrate, (MACROBID) 100 MG capsule Take 1 capsule (100 mg total) by mouth 2 (two) times daily. 10/12/19   Lucretia Kern, DO  rosuvastatin (CRESTOR) 10 MG tablet TAKE ONE TABLET BY MOUTH DAILY 10/28/19   Caren Macadam, MD  [facility] injection LOT                     RA/LA                   EXP                    [facility] 05/30/17   [provider]  traZODone (DESYREL) 50 MG tablet TAKE ONE TABLET BY MOUTH EVERY NIGHT AT BEDTIME AS NEEDED FOR SLEEP 08/25/19   Caren Macadam, MD    Allergies    Codeine, Metaxalone, Naproxen, Lisinopril, Piroxicam, Prednisone, Sulfa antibiotics, and Penicillins  Review of Systems   Review of Systems All other systems reviewed and are negative except that which was mentioned in HPI  Physical Exam Updated Vital Signs BP (!) 185/94   Pulse 86   Temp 98.1 F (36.7 C) (Oral)   Resp 19   SpO2 98%   Physical Exam Vitals and nursing note reviewed.  Constitutional:      General: She is not in acute distress.    Appearance: She is well-developed.  HENT:     Head: Normocephalic.     Comments: Hematoma left forehead above eyebrow Eyes:     Conjunctiva/sclera: Conjunctivae normal.     Pupils: Pupils are equal, round, and reactive to light.  Neck:     Comments: In c-collar Cardiovascular:     Rate and Rhythm: Normal rate and regular rhythm.     Heart sounds: Normal heart sounds.  Pulmonary:     Effort: Pulmonary effort is normal.     Breath sounds: Normal breath sounds.  Chest:     Chest wall: No tenderness.  Abdominal:     General: Bowel sounds are normal. There  is no distension.     Palpations:  Abdomen is soft.     Tenderness: There is no abdominal tenderness.  Musculoskeletal:        General: Tenderness present.     Cervical back: No tenderness.     Right lower leg: Edema present.     Left lower leg: Edema present.     Comments: Tenderness of left shoulder with large abrasion, no tenderness of elbow; mild tenderness of bilateral wrists without obvious deformity, 2+ radial pulses bilaterally, normal sensation hands; normal range of motion hips, knees, ankles  Skin:    General: Skin is warm and dry.  Neurological:     Mental Status: She is alert and oriented to person, place, and time.     Comments: Fluent speech  Psychiatric:        Mood and Affect: Mood normal.        Behavior: Behavior normal.     ED Results / Procedures / Treatments   Labs (all labs ordered are listed, but only abnormal results are displayed) Labs Reviewed - No data to display  EKG None  Radiology DG Thoracic Spine 2 View  Result Date: 11/09/2019 CLINICAL DATA:  Pain status post fall. Central back pain. EXAM: THORACIC SPINE 2 VIEWS COMPARISON:  02/18/2008 FINDINGS: There is no evidence of thoracic spine fracture. Alignment is normal. No other significant bone abnormalities are identified. There are multilevel degenerative changes throughout the thoracic spine atherosclerotic changes are noted of the thoracic aorta. IMPRESSION: 1. No acute displaced fracture or dislocation. 2. Multilevel degenerative changes. 3. Aortic atherosclerosis. Electronically Signed   By: Constance Holster M.D.   On: 11/09/2019 19:26   DG Wrist Complete Left  Result Date: 11/09/2019 CLINICAL DATA:  Pain status post fall EXAM: LEFT WRIST - COMPLETE 3+ VIEW COMPARISON:  None. FINDINGS: There are degenerative changes of the wrist, especially at the first [facility] and radiocarpal joint. There is no definite acute displaced fracture or dislocation. There is osteopenia which limits detection of nondisplaced  fractures. IMPRESSION: 1. No definite acute displaced fracture or dislocation. 2. Degenerative changes as detailed above. Electronically Signed   By: Constance Holster M.D.   On: 11/09/2019 19:28   DG Wrist Complete Right  Result Date: 11/09/2019 CLINICAL DATA:  Pain status post fall EXAM: RIGHT WRIST - COMPLETE 3+ VIEW COMPARISON:  None. FINDINGS: There is no evidence of fracture or dislocation. There is no evidence of arthropathy or other focal bone abnormality. Soft tissues are unremarkable. IMPRESSION: Negative. Electronically Signed   By: Constance Holster M.D.   On: 11/09/2019 19:29   CT Head Wo Contrast  Result Date: 11/09/2019 CLINICAL DATA:  [place]. Hit head. EXAM: CT HEAD WITHOUT CONTRAST CT CERVICAL SPINE WITHOUT CONTRAST TECHNIQUE: Multidetector CT imaging of the head and cervical spine was performed following the standard protocol without intravenous contrast. Multiplanar CT image reconstructions of the cervical spine were also generated. COMPARISON:  None. FINDINGS: CT HEAD FINDINGS Brain: Age related cerebral atrophy, ventriculomegaly and periventricular white matter disease. No acute intracranial findings such as hemispheric infarction or intracranial hemorrhage. No extra-axial fluid collections are identified. The brainstem and cerebellum are grossly normal. Vascular: Vascular calcifications but no definite aneurysm or hyperdense vessels. Skull: No skull fracture or bone lesions. Sinuses/Orbits: The paranasal sinuses and mastoid air cells are clear. The globes are intact. Other: There is a moderate to large scalp hematoma in the left frontal parietal area but no underlying skull fracture or evidence of contrecoup injury. CT CERVICAL SPINE FINDINGS Alignment: Normal Skull base and vertebrae: No  acute fracture. No primary bone lesion or focal pathologic process. Soft tissues and spinal canal: No prevertebral fluid or swelling. No visible canal hematoma. Disc levels: The spinal canal is generous.  No significant spinal or foraminal stenosis. Upper chest: The lung apices are grossly clear. Other: Multiple bilateral inconsequential thyroid nodules. IMPRESSION: 1. Age related cerebral atrophy, ventriculomegaly and periventricular white matter disease. 2. No acute intracranial findings or skull fracture. 3. Normal alignment of the cervical vertebral bodies and no acute cervical spine fracture. Electronically Signed   By: Marijo Sanes M.D.   On: 11/09/2019 20:27   CT Cervical Spine Wo Contrast  Result Date: 11/09/2019 CLINICAL DATA:  [place]. Hit head. EXAM: CT HEAD WITHOUT CONTRAST CT CERVICAL SPINE WITHOUT CONTRAST TECHNIQUE: Multidetector CT imaging of the head and cervical spine was performed following the standard protocol without intravenous contrast. Multiplanar CT image reconstructions of the cervical spine were also generated. COMPARISON:  None. FINDINGS: CT HEAD FINDINGS Brain: Age related cerebral atrophy, ventriculomegaly and periventricular white matter disease. No acute intracranial findings such as hemispheric infarction or intracranial hemorrhage. No extra-axial fluid collections are identified. The brainstem and cerebellum are grossly normal. Vascular: Vascular calcifications but no definite aneurysm or hyperdense vessels. Skull: No skull fracture or bone lesions. Sinuses/Orbits: The paranasal sinuses and mastoid air cells are clear. The globes are intact. Other: There is a moderate to large scalp hematoma in the left frontal parietal area but no underlying skull fracture or evidence of contrecoup injury. CT CERVICAL SPINE FINDINGS Alignment: Normal Skull base and vertebrae: No acute fracture. No primary bone lesion or focal pathologic process. Soft tissues and spinal canal: No prevertebral fluid or swelling. No visible canal hematoma. Disc levels: The spinal canal is generous. No significant spinal or foraminal stenosis. Upper chest: The lung apices are grossly clear. Other: Multiple bilateral  inconsequential thyroid nodules. IMPRESSION: 1. Age related cerebral atrophy, ventriculomegaly and periventricular white matter disease. 2. No acute intracranial findings or skull fracture. 3. Normal alignment of the cervical vertebral bodies and no acute cervical spine fracture. Electronically Signed   By: Marijo Sanes M.D.   On: 11/09/2019 20:27   DG Shoulder Left  Result Date: 11/09/2019 CLINICAL DATA:  [place], left shoulder pain EXAM: LEFT SHOULDER - 2+ VIEW COMPARISON:  04/05/2009 FINDINGS: Internal rotation, external rotation, and transscapular views of the left shoulder demonstrate plate and screw fixation from prior humeral neck fracture repair. There is glenohumeral joint space narrowing and osteophyte formation. Mild hypertrophic change of the acromioclavicular joint. Minimally displaced left posterolateral fourth rib fracture. The left chest is clear. IMPRESSION: 1. Posterolateral left fourth rib fracture. 2. ORIF previous left humeral neck fracture. 3. Left shoulder osteoarthritis. Electronically Signed   By: Randa Ngo M.D.   On: 11/09/2019 19:28    Procedures Procedures (including critical care time)  Medications Ordered in ED Medications  bacitracin ointment 1 application (has no administration in time range)  Tdap (BOOSTRIX) injection 0.5 mL (0.5 mLs Intramuscular Given 11/09/19 1828)  HYDROcodone-acetaminophen (NORCO/VICODIN) 5-325 MG per tablet 1 tablet (1 tablet Oral Given 11/09/19 1827)    ED Course  I have reviewed the triage vital signs and the nursing notes.  Pertinent labs & imaging results that were available during my care of the patient were reviewed by me and considered in my medical decision making (see chart for details).    MDM Rules/Calculators/A&P  Alert and neurologically intact on arrival, hypertensive.  Injuries as above.  Updated tetanus. CT head and c-spine negative acute. XR thoracic spine, shoulder, wrists notable only for  posterolateral L 4th rib fx. On reassessment, pt denying chest pain but then later does acknowledge some mild pain on lateral chest wall. No bruising or crepitus on exam. PRovided w/ IS and instructions on use.  She denies any breathing difficulty currently and pain is well controlled.  I have discussed supportive measures including pain control and recommended PCP follow-up.  I have also extensively reviewed return precautions and she voiced understanding. Final Clinical Impression(s) / ED Diagnoses Final diagnoses:  Closed fracture of one rib of left side, initial encounter  Contusion of forehead, initial encounter  Abrasions of multiple sites    Rx / DC Orders ED Discharge Orders    None       Clare Fennimore, Wenda Overland, MD 11/09/19 2146

## 2019-11-09 NOTE — Discharge Instructions (Addendum)
TAKE TYLENOL 1000MG  3 TIMES A DAY FOR YOUR PAIN. USE INCENTIVE SPIROMETER AT LEAST 3-4 TIMES A DAY. FOLLOW UP WITH PRIMARY CARE PROVIDER IF YOU CONTINUE TO HAVE PAIN.

## 2019-11-09 NOTE — ED Notes (Signed)
Pt educated on use of incentive spirometer

## 2019-11-09 NOTE — ED Triage Notes (Signed)
Pt arrives from home via [facility] after having a fall outside on her side walk outside of her [place]- pt reports tripping on walker and hitting head on sidewalk while coming off the curb- pt has hematoma to left forehead and arrives in c collar placed by [facility]. Pt is alert and ox4. Pt also has pain to left knee.

## 2019-11-09 NOTE — ED Notes (Signed)
Pt up to restroom with assistance.

## 2019-11-09 NOTE — ED Notes (Signed)
Discharge instructions reviewed with pt. Pt verbalized understanding. Pt moved into hallway until family arrives.

## 2019-11-12 ENCOUNTER — Telehealth: Payer: Self-pay | Admitting: Family Medicine

## 2019-11-12 ENCOUNTER — Encounter: Payer: Self-pay | Admitting: Adult Health

## 2019-11-12 ENCOUNTER — Ambulatory Visit (INDEPENDENT_AMBULATORY_CARE_PROVIDER_SITE_OTHER): Payer: Medicare Other | Admitting: Adult Health

## 2019-11-12 ENCOUNTER — Telehealth: Payer: Self-pay | Admitting: *Deleted

## 2019-11-12 VITALS — BP 144/92 | HR 95 | Temp 97.6°F | Wt 151.0 lb

## 2019-11-12 DIAGNOSIS — S0990XD Unspecified injury of head, subsequent encounter: Secondary | ICD-10-CM

## 2019-11-12 DIAGNOSIS — S2232XD Fracture of one rib, left side, subsequent encounter for fracture with routine healing: Secondary | ICD-10-CM

## 2019-11-12 DIAGNOSIS — Z111 Encounter for screening for respiratory tuberculosis: Secondary | ICD-10-CM

## 2019-11-12 NOTE — Patient Instructions (Signed)
It was great seeing you today   Please take tylenol 650 mg every 6-8 hours for pain control   Follow up on Monday for the read of your PPD

## 2019-11-12 NOTE — Telephone Encounter (Signed)
Ms Shelley Robinson called back, questions were reviewed, forms completed and faxed to [facility].  Also sent to be scanned.

## 2019-11-12 NOTE — Telephone Encounter (Signed)
No answer at the pts daughter's cell number.

## 2019-11-12 NOTE — Progress Notes (Signed)
Subjective:    Patient ID: Shelley Robinson, female    DOB: 1927-01-16, 86 y.o.   MRN: [id number]  HPI 86 year old female who  has a past medical history of Anxiety, Arthritis, GERD (gastroesophageal reflux disease), Hyperlipidemia, Hypertension, and Osteopenia.   She is with her daughter today   She is a patient of Dr. Ethlyn Gallery, who I am seeing today for follow up after ER visit three days ago for a mechanical fall. She was walking outside of her house and stepped onto the sidewalk, tripped on the curb and fell- striking the left side of her forehead and her left shoulder.  Her CT head and c-spine, XR of thoracic spine, shoulder, and wrists were all negative except for poteriolateral L 4th rib fx.   Since being discharged from the emergency room she continues to have discomfort throughout her body but mostly on her left forehead, left shoulder, left wrist, and left chest wall.  She has been using an incentive spirometer at home on a routine basis.  Her pain control she is taken Tylenol twice in the last 3 days but has noticed improvement when taking it.  She has not had any recurrent falls and denies headaches since discharge.  Her daughter was able to get her room and had nursing facility in [place].  They need paperwork filled out by the PCP as well as a PPD test.   Review of Systems See HPI   Past Medical History:  Diagnosis Date   Anxiety    Arthritis    GERD (gastroesophageal reflux disease)    Hyperlipidemia    Hypertension    Osteopenia     Social History   Socioeconomic History   Marital status: Widowed    Spouse name: Not on file   Number of children: Not on file   Years of education: Not on file   Highest education level: Not on file  Occupational History   Not on file  Tobacco Use   Smoking status: Former Smoker    Quit date: 07/08/1978    Years since quitting: 41.3   Smokeless tobacco: Never Used  Substance and Sexual Activity   Alcohol  use: Yes    Alcohol/week: 1.0 standard drinks    Types: 1 Glasses of wine per week    Comment: once or twice a month   Drug use: No   Sexual activity: Never    Partners: Male    Birth control/protection: Post-menopausal  Other Topics Concern   Not on file  Social History Narrative   Not on file   Social Determinants of Health   Financial Resource Strain:    Difficulty of Paying Living Expenses:   Food Insecurity:    Worried About Charity fundraiser in the Last Year:    Arboriculturist in the Last Year:   Transportation Needs:    Film/video editor (Medical):    Lack of Transportation (Non-Medical):   Physical Activity:    Days of Exercise per Week:    Minutes of Exercise per Session:   Stress:    Feeling of Stress :   Social Connections:    Frequency of Communication with Friends and Family:    Frequency of Social Gatherings with Friends and Family:    Attends Religious Services:    Active Member of Clubs or Organizations:    Attends Archivist Meetings:    Marital Status:   Intimate Partner Violence:    Fear  of Current or Ex-Partner:    Emotionally Abused:    Physically Abused:    Sexually Abused:     Past Surgical History:  Procedure Laterality Date   ABDOMINAL HYSTERECTOMY     fibroid tumors   CHOLECYSTECTOMY     JOINT REPLACEMENT     left shoulder   TONSILLECTOMY      Family History  Problem Relation Age of Onset   Alzheimer's disease Mother    Heart disease Father    Cancer Sister    Lymphoma Sister     Allergies  Allergen Reactions   Codeine Other (See Comments)   Metaxalone Other (See Comments)   Naproxen Other (See Comments)   Lisinopril Cough   Piroxicam Other (See Comments)    unknown   Prednisone Other (See Comments)    hypertensive   Sulfa Antibiotics     Reaction unknown   Penicillins Rash    DID THE REACTION INVOLVE: Swelling of the face/tongue/throat, SOB, or low BP? Skin  Rash Sudden or severe rash/hives, skin peeling, or the inside of the mouth or nose? Y Did it require medical treatment? N When did it last happen?Over 61 Years Ago. If all above answers are NO, may proceed with cephalosporin use.     Current Outpatient Medications on File Prior to Visit  Medication Sig Dispense Refill   acetaminophen (TYLENOL) 500 MG tablet Take 1 tablet (500 mg total) by mouth every 6 (six) hours as needed for pain. Take one  in AM and one at qhs, and one at noon if needed (Patient taking differently: Take 500 mg by mouth every 8 (eight) hours as needed for moderate pain. Take one  in AM and one at qhs, and one at noon if needed) 30 tablet 0   diclofenac sodium (VOLTAREN) 1 % GEL Apply 4 g topically 4 (four) times daily. (Patient taking differently: Apply 4 g topically 4 (four) times daily as needed (pain). ) 4 Tube 3   esomeprazole (NEXIUM) 40 MG capsule Take 1 capsule (40 mg total) by mouth daily before supper. 90 capsule 3   estradiol (ESTRACE) 0.1 MG/GM vaginal cream Place 1 Applicatorful vaginally at bedtime. 42.5 g 0   ipratropium (ATROVENT) 0.06 % nasal spray Place 2 sprays into both nostrils 4 (four) times daily. 15 mL 12   loratadine (CLARITIN) 10 MG tablet Take 10 mg by mouth daily. Not sure of the mg/lc     losartan-hydrochlorothiazide (HYZAAR) 100-12.5 MG tablet Take 1 tablet by mouth daily. 90 tablet 3   Multiple Vitamins-Minerals (PRESERVISION AREDS 2 PO) Take 2 tablets by mouth daily.      traZODone (DESYREL) 50 MG tablet TAKE ONE TABLET BY MOUTH EVERY NIGHT AT BEDTIME AS NEEDED FOR SLEEP 90 tablet 1   No current facility-administered medications on file prior to visit.    BP (!) 144/92    Pulse 95    Temp 97.6 F (36.4 C)    Wt 151 lb (68.5 kg)    SpO2 99%    BMI 29.49 kg/m       Objective:   Physical Exam Vitals and nursing note reviewed.  Constitutional:      Appearance: Normal appearance.  HENT:     Right Ear: Tympanic membrane,  ear canal and external ear normal. No hemotympanum.     Left Ear: Tympanic membrane, ear canal and external ear normal. No hemotympanum.  Eyes:     Extraocular Movements: Extraocular movements intact.     Conjunctiva/sclera: Conjunctivae  normal.     Pupils: Pupils are equal, round, and reactive to light.  Cardiovascular:     Rate and Rhythm: Normal rate and regular rhythm.     Pulses: Normal pulses.     Heart sounds: Normal heart sounds.  Pulmonary:     Effort: Pulmonary effort is normal.     Breath sounds: Normal breath sounds.  Musculoskeletal:        General: Normal range of motion.  Skin:    General: Skin is warm and dry.     Capillary Refill: Capillary refill takes less than 2 seconds.     Findings: Bruising present.     Comments: Bruising noted to left orbit and left forehead as well as throughout her left hand/wrist/forearm.  She has a small abrasion on her left shoulder.  No acute deformities noted  Neurological:     General: No focal deficit present.     Mental Status: She is alert and oriented to person, place, and time.     Gait: Gait abnormal.     Comments: Has steady gait with her rolling walker  Psychiatric:        Mood and Affect: Mood normal.        Behavior: Behavior normal.        Thought Content: Thought content normal.        Judgment: Judgment normal.       Assessment & Plan:  1. Closed fracture of one rib of left side with routine healing, subsequent encounter -Encouraged to continue to use NSAIDs incentive spirometer throughout the day.  She can use heating pads and advised to take Motrin 650 every 6-8 hours as needed.  2. Injury of head, subsequent encounter -Can take Motrin.  Avoid stimulating objects or bright lights for the next week. -Flags reviewed with follow-up instructions  3. Screening-pulmonary TB -Paperwork to be filled out was given to her PCP - PPD

## 2019-11-12 NOTE — Telephone Encounter (Signed)
Pt daughter  is return your call.

## 2019-11-12 NOTE — Telephone Encounter (Signed)
Dr Ethlyn Gallery received an [place] form to complete for the pt.  Dr Ethlyn Gallery completed her part of the form and advised I call the pts daughter in order to complete the questions regarding COVID vaccine and testing.  I left a message for the pts daughter Ms Shelley Robinson [phone])  to return my call.

## 2019-11-12 NOTE — Telephone Encounter (Signed)
See prior note

## 2019-11-15 LAB — TB SKIN TEST
Induration: 0 mm
TB Skin Test: NEGATIVE

## 2019-11-26 ENCOUNTER — Ambulatory Visit: Payer: Medicare Other | Admitting: Family Medicine

## 2019-12-23 DIAGNOSIS — N842 Polyp of vagina: Secondary | ICD-10-CM | POA: Diagnosis not present

## 2019-12-23 DIAGNOSIS — I10 Essential (primary) hypertension: Secondary | ICD-10-CM | POA: Diagnosis not present

## 2019-12-23 DIAGNOSIS — Z87891 Personal history of nicotine dependence: Secondary | ICD-10-CM | POA: Diagnosis not present

## 2019-12-23 DIAGNOSIS — R519 Headache, unspecified: Secondary | ICD-10-CM | POA: Diagnosis not present

## 2019-12-23 DIAGNOSIS — Z7689 Persons encountering health services in other specified circumstances: Secondary | ICD-10-CM | POA: Diagnosis not present

## 2019-12-23 DIAGNOSIS — Z6830 Body mass index (BMI) 30.0-30.9, adult: Secondary | ICD-10-CM | POA: Diagnosis not present

## 2020-01-28 DIAGNOSIS — M25561 Pain in right knee: Secondary | ICD-10-CM | POA: Diagnosis not present

## 2020-01-28 DIAGNOSIS — M25562 Pain in left knee: Secondary | ICD-10-CM | POA: Diagnosis not present

## 2020-01-28 DIAGNOSIS — M170 Bilateral primary osteoarthritis of knee: Secondary | ICD-10-CM | POA: Diagnosis not present

## 2020-03-16 DIAGNOSIS — I10 Essential (primary) hypertension: Secondary | ICD-10-CM | POA: Diagnosis not present

## 2020-03-24 DIAGNOSIS — Z760 Encounter for issue of repeat prescription: Secondary | ICD-10-CM | POA: Diagnosis not present

## 2020-03-24 DIAGNOSIS — N952 Postmenopausal atrophic vaginitis: Secondary | ICD-10-CM | POA: Diagnosis not present

## 2020-03-24 DIAGNOSIS — Z87891 Personal history of nicotine dependence: Secondary | ICD-10-CM | POA: Diagnosis not present

## 2020-03-24 DIAGNOSIS — R519 Headache, unspecified: Secondary | ICD-10-CM | POA: Diagnosis not present

## 2020-03-24 DIAGNOSIS — I10 Essential (primary) hypertension: Secondary | ICD-10-CM | POA: Diagnosis not present

## 2020-03-24 DIAGNOSIS — Z23 Encounter for immunization: Secondary | ICD-10-CM | POA: Diagnosis not present

## 2020-03-24 DIAGNOSIS — E785 Hyperlipidemia, unspecified: Secondary | ICD-10-CM | POA: Diagnosis not present

## 2020-03-24 DIAGNOSIS — N8110 Cystocele, unspecified: Secondary | ICD-10-CM | POA: Diagnosis not present

## 2020-04-04 DIAGNOSIS — Z23 Encounter for immunization: Secondary | ICD-10-CM | POA: Diagnosis not present

## 2020-05-10 DIAGNOSIS — M170 Bilateral primary osteoarthritis of knee: Secondary | ICD-10-CM | POA: Diagnosis not present

## 2020-05-23 DIAGNOSIS — N898 Other specified noninflammatory disorders of vagina: Secondary | ICD-10-CM | POA: Diagnosis not present

## 2020-05-23 DIAGNOSIS — N8189 Other female genital prolapse: Secondary | ICD-10-CM | POA: Diagnosis not present

## 2020-05-25 DIAGNOSIS — N8189 Other female genital prolapse: Secondary | ICD-10-CM | POA: Diagnosis not present

## 2020-05-25 DIAGNOSIS — N898 Other specified noninflammatory disorders of vagina: Secondary | ICD-10-CM | POA: Diagnosis not present

## 2020-06-19 DIAGNOSIS — R300 Dysuria: Secondary | ICD-10-CM | POA: Diagnosis not present

## 2020-06-22 DIAGNOSIS — R0902 Hypoxemia: Secondary | ICD-10-CM | POA: Diagnosis not present

## 2020-06-22 DIAGNOSIS — R32 Unspecified urinary incontinence: Secondary | ICD-10-CM | POA: Diagnosis not present

## 2020-06-22 DIAGNOSIS — R064 Hyperventilation: Secondary | ICD-10-CM | POA: Diagnosis not present

## 2020-06-22 DIAGNOSIS — E538 Deficiency of other specified B group vitamins: Secondary | ICD-10-CM | POA: Diagnosis not present

## 2020-06-22 DIAGNOSIS — R059 Cough, unspecified: Secondary | ICD-10-CM | POA: Diagnosis not present

## 2020-06-22 DIAGNOSIS — J069 Acute upper respiratory infection, unspecified: Secondary | ICD-10-CM | POA: Diagnosis not present

## 2020-06-22 DIAGNOSIS — I2510 Atherosclerotic heart disease of native coronary artery without angina pectoris: Secondary | ICD-10-CM | POA: Diagnosis not present

## 2020-06-22 DIAGNOSIS — N390 Urinary tract infection, site not specified: Secondary | ICD-10-CM | POA: Diagnosis not present

## 2020-06-22 DIAGNOSIS — F0390 Unspecified dementia without behavioral disturbance: Secondary | ICD-10-CM | POA: Diagnosis not present

## 2020-06-22 DIAGNOSIS — K219 Gastro-esophageal reflux disease without esophagitis: Secondary | ICD-10-CM | POA: Diagnosis not present

## 2020-06-22 DIAGNOSIS — R0689 Other abnormalities of breathing: Secondary | ICD-10-CM | POA: Diagnosis not present

## 2020-06-22 DIAGNOSIS — J45909 Unspecified asthma, uncomplicated: Secondary | ICD-10-CM | POA: Diagnosis not present

## 2020-06-22 DIAGNOSIS — I4510 Unspecified right bundle-branch block: Secondary | ICD-10-CM | POA: Diagnosis not present

## 2020-06-22 DIAGNOSIS — R0602 Shortness of breath: Secondary | ICD-10-CM | POA: Diagnosis not present

## 2020-06-22 DIAGNOSIS — R6510 Systemic inflammatory response syndrome (SIRS) of non-infectious origin without acute organ dysfunction: Secondary | ICD-10-CM | POA: Diagnosis not present

## 2020-06-22 DIAGNOSIS — N184 Chronic kidney disease, stage 4 (severe): Secondary | ICD-10-CM | POA: Diagnosis not present

## 2020-06-22 DIAGNOSIS — R1310 Dysphagia, unspecified: Secondary | ICD-10-CM | POA: Diagnosis not present

## 2020-06-22 DIAGNOSIS — R231 Pallor: Secondary | ICD-10-CM | POA: Diagnosis not present

## 2020-06-22 DIAGNOSIS — R062 Wheezing: Secondary | ICD-10-CM | POA: Diagnosis not present

## 2020-06-22 DIAGNOSIS — I129 Hypertensive chronic kidney disease with stage 1 through stage 4 chronic kidney disease, or unspecified chronic kidney disease: Secondary | ICD-10-CM | POA: Diagnosis not present

## 2020-06-22 DIAGNOSIS — Z20822 Contact with and (suspected) exposure to covid-19: Secondary | ICD-10-CM | POA: Diagnosis not present

## 2020-06-22 DIAGNOSIS — R000 Tachycardia, unspecified: Secondary | ICD-10-CM | POA: Diagnosis not present

## 2020-06-22 DIAGNOSIS — J209 Acute bronchitis, unspecified: Secondary | ICD-10-CM | POA: Diagnosis not present

## 2020-06-23 DIAGNOSIS — R0602 Shortness of breath: Secondary | ICD-10-CM | POA: Diagnosis not present

## 2020-06-23 DIAGNOSIS — N281 Cyst of kidney, acquired: Secondary | ICD-10-CM | POA: Diagnosis not present

## 2020-06-23 DIAGNOSIS — R062 Wheezing: Secondary | ICD-10-CM | POA: Diagnosis not present

## 2020-06-23 DIAGNOSIS — R32 Unspecified urinary incontinence: Secondary | ICD-10-CM | POA: Diagnosis not present

## 2020-06-23 DIAGNOSIS — R000 Tachycardia, unspecified: Secondary | ICD-10-CM | POA: Diagnosis not present

## 2020-06-23 DIAGNOSIS — R231 Pallor: Secondary | ICD-10-CM | POA: Diagnosis not present

## 2020-06-23 DIAGNOSIS — R059 Cough, unspecified: Secondary | ICD-10-CM | POA: Diagnosis not present

## 2020-06-23 DIAGNOSIS — R6510 Systemic inflammatory response syndrome (SIRS) of non-infectious origin without acute organ dysfunction: Secondary | ICD-10-CM | POA: Diagnosis not present

## 2020-06-23 DIAGNOSIS — I4510 Unspecified right bundle-branch block: Secondary | ICD-10-CM | POA: Diagnosis not present

## 2020-06-23 DIAGNOSIS — J069 Acute upper respiratory infection, unspecified: Secondary | ICD-10-CM | POA: Diagnosis not present

## 2020-06-23 DIAGNOSIS — E538 Deficiency of other specified B group vitamins: Secondary | ICD-10-CM | POA: Diagnosis not present

## 2020-06-23 DIAGNOSIS — J45909 Unspecified asthma, uncomplicated: Secondary | ICD-10-CM | POA: Diagnosis not present

## 2020-06-23 DIAGNOSIS — K5730 Diverticulosis of large intestine without perforation or abscess without bleeding: Secondary | ICD-10-CM | POA: Diagnosis not present

## 2020-06-23 DIAGNOSIS — R0902 Hypoxemia: Secondary | ICD-10-CM | POA: Diagnosis not present

## 2020-06-24 DIAGNOSIS — R0602 Shortness of breath: Secondary | ICD-10-CM | POA: Diagnosis not present

## 2020-06-24 DIAGNOSIS — R000 Tachycardia, unspecified: Secondary | ICD-10-CM | POA: Diagnosis not present

## 2020-06-24 DIAGNOSIS — I4510 Unspecified right bundle-branch block: Secondary | ICD-10-CM | POA: Diagnosis not present

## 2020-06-24 DIAGNOSIS — R6510 Systemic inflammatory response syndrome (SIRS) of non-infectious origin without acute organ dysfunction: Secondary | ICD-10-CM | POA: Diagnosis not present

## 2020-06-24 DIAGNOSIS — R32 Unspecified urinary incontinence: Secondary | ICD-10-CM | POA: Diagnosis not present

## 2020-06-24 DIAGNOSIS — R231 Pallor: Secondary | ICD-10-CM | POA: Diagnosis not present

## 2020-06-24 DIAGNOSIS — J069 Acute upper respiratory infection, unspecified: Secondary | ICD-10-CM | POA: Diagnosis not present

## 2020-06-24 DIAGNOSIS — E538 Deficiency of other specified B group vitamins: Secondary | ICD-10-CM | POA: Diagnosis not present

## 2020-06-24 DIAGNOSIS — J45909 Unspecified asthma, uncomplicated: Secondary | ICD-10-CM | POA: Diagnosis not present

## 2020-06-24 DIAGNOSIS — R062 Wheezing: Secondary | ICD-10-CM | POA: Diagnosis not present

## 2020-06-24 DIAGNOSIS — R059 Cough, unspecified: Secondary | ICD-10-CM | POA: Diagnosis not present

## 2020-06-24 DIAGNOSIS — I517 Cardiomegaly: Secondary | ICD-10-CM | POA: Diagnosis not present

## 2020-06-24 DIAGNOSIS — R0902 Hypoxemia: Secondary | ICD-10-CM | POA: Diagnosis not present

## 2020-06-25 DIAGNOSIS — I4510 Unspecified right bundle-branch block: Secondary | ICD-10-CM | POA: Diagnosis not present

## 2020-06-25 DIAGNOSIS — R062 Wheezing: Secondary | ICD-10-CM | POA: Diagnosis not present

## 2020-06-25 DIAGNOSIS — R0602 Shortness of breath: Secondary | ICD-10-CM | POA: Diagnosis not present

## 2020-06-25 DIAGNOSIS — R0902 Hypoxemia: Secondary | ICD-10-CM | POA: Diagnosis not present

## 2020-06-25 DIAGNOSIS — R000 Tachycardia, unspecified: Secondary | ICD-10-CM | POA: Diagnosis not present

## 2020-06-25 DIAGNOSIS — R231 Pallor: Secondary | ICD-10-CM | POA: Diagnosis not present

## 2020-06-25 DIAGNOSIS — J069 Acute upper respiratory infection, unspecified: Secondary | ICD-10-CM | POA: Diagnosis not present

## 2020-06-25 DIAGNOSIS — E538 Deficiency of other specified B group vitamins: Secondary | ICD-10-CM | POA: Diagnosis not present

## 2020-06-25 DIAGNOSIS — R6510 Systemic inflammatory response syndrome (SIRS) of non-infectious origin without acute organ dysfunction: Secondary | ICD-10-CM | POA: Diagnosis not present

## 2020-06-25 DIAGNOSIS — J45909 Unspecified asthma, uncomplicated: Secondary | ICD-10-CM | POA: Diagnosis not present

## 2020-06-25 DIAGNOSIS — R32 Unspecified urinary incontinence: Secondary | ICD-10-CM | POA: Diagnosis not present

## 2020-06-25 DIAGNOSIS — R059 Cough, unspecified: Secondary | ICD-10-CM | POA: Diagnosis not present

## 2020-06-26 DIAGNOSIS — E538 Deficiency of other specified B group vitamins: Secondary | ICD-10-CM | POA: Diagnosis not present

## 2020-06-26 DIAGNOSIS — R000 Tachycardia, unspecified: Secondary | ICD-10-CM | POA: Diagnosis not present

## 2020-06-26 DIAGNOSIS — R062 Wheezing: Secondary | ICD-10-CM | POA: Diagnosis not present

## 2020-06-26 DIAGNOSIS — R059 Cough, unspecified: Secondary | ICD-10-CM | POA: Diagnosis not present

## 2020-06-26 DIAGNOSIS — R0602 Shortness of breath: Secondary | ICD-10-CM | POA: Diagnosis not present

## 2020-06-26 DIAGNOSIS — R6510 Systemic inflammatory response syndrome (SIRS) of non-infectious origin without acute organ dysfunction: Secondary | ICD-10-CM | POA: Diagnosis not present

## 2020-06-26 DIAGNOSIS — R231 Pallor: Secondary | ICD-10-CM | POA: Diagnosis not present

## 2020-06-26 DIAGNOSIS — I4510 Unspecified right bundle-branch block: Secondary | ICD-10-CM | POA: Diagnosis not present

## 2020-06-26 DIAGNOSIS — R32 Unspecified urinary incontinence: Secondary | ICD-10-CM | POA: Diagnosis not present

## 2020-06-26 DIAGNOSIS — J45909 Unspecified asthma, uncomplicated: Secondary | ICD-10-CM | POA: Diagnosis not present

## 2020-06-26 DIAGNOSIS — R0902 Hypoxemia: Secondary | ICD-10-CM | POA: Diagnosis not present

## 2020-06-26 DIAGNOSIS — J069 Acute upper respiratory infection, unspecified: Secondary | ICD-10-CM | POA: Diagnosis not present

## 2020-06-29 DIAGNOSIS — N8189 Other female genital prolapse: Secondary | ICD-10-CM | POA: Diagnosis not present

## 2020-06-29 DIAGNOSIS — N898 Other specified noninflammatory disorders of vagina: Secondary | ICD-10-CM | POA: Diagnosis not present

## 2020-06-29 DIAGNOSIS — N8111 Cystocele, midline: Secondary | ICD-10-CM | POA: Diagnosis not present

## 2023-09-06 DEATH — deceased
